# Patient Record
Sex: Male | Born: 1935 | Race: White | Hispanic: No | Smoking: Former smoker
Health system: Southern US, Community
[De-identification: ages and names within clinical notes are randomized; demographics above are authoritative.]

## PROBLEM LIST (undated history)

## (undated) DIAGNOSIS — Z85828 Personal history of other malignant neoplasm of skin: Secondary | ICD-10-CM

## (undated) DIAGNOSIS — L989 Disorder of the skin and subcutaneous tissue, unspecified: Secondary | ICD-10-CM

## (undated) DIAGNOSIS — D472 Monoclonal gammopathy: Secondary | ICD-10-CM

## (undated) DIAGNOSIS — E119 Type 2 diabetes mellitus without complications: Secondary | ICD-10-CM

## (undated) DIAGNOSIS — C9000 Multiple myeloma not having achieved remission: Secondary | ICD-10-CM

## (undated) DIAGNOSIS — N182 Chronic kidney disease, stage 2 (mild): Secondary | ICD-10-CM

## (undated) DIAGNOSIS — I10 Essential (primary) hypertension: Secondary | ICD-10-CM

## (undated) DIAGNOSIS — H409 Unspecified glaucoma: Secondary | ICD-10-CM

## (undated) DIAGNOSIS — C439 Malignant melanoma of skin, unspecified: Secondary | ICD-10-CM

## (undated) DIAGNOSIS — C001 Malignant neoplasm of external lower lip: Secondary | ICD-10-CM

## (undated) DIAGNOSIS — S68119A Complete traumatic metacarpophalangeal amputation of unspecified finger, initial encounter: Secondary | ICD-10-CM

## (undated) DIAGNOSIS — D61818 Other pancytopenia: Secondary | ICD-10-CM

## (undated) DIAGNOSIS — E7800 Pure hypercholesterolemia, unspecified: Secondary | ICD-10-CM

## (undated) HISTORY — PX: MELANOMA EXCISION: SHX5266

## (undated) HISTORY — DX: Malignant neoplasm of external lower lip: C00.1

## (undated) HISTORY — DX: Multiple myeloma not having achieved remission: C90.00

## (undated) HISTORY — DX: Personal history of other malignant neoplasm of skin: Z85.828

## (undated) HISTORY — PX: CORNEAL TRANSPLANT: SHX108

## (undated) HISTORY — DX: Pure hypercholesterolemia, unspecified: E78.00

## (undated) HISTORY — DX: Disorder of the skin and subcutaneous tissue, unspecified: L98.9

## (undated) HISTORY — PX: CATARACT EXTRACTION: SUR2

## (undated) HISTORY — PX: TONSILLECTOMY: SUR1361

## (undated) HISTORY — DX: Essential (primary) hypertension: I10

## (undated) HISTORY — DX: Complete traumatic metacarpophalangeal amputation of unspecified finger, initial encounter: S68.119A

## (undated) HISTORY — DX: Type 2 diabetes mellitus without complications: E11.9

## (undated) HISTORY — DX: Other pancytopenia: D61.818

## (undated) HISTORY — DX: Malignant melanoma of skin, unspecified: C43.9

## (undated) HISTORY — DX: Unspecified glaucoma: H40.9

## (undated) HISTORY — DX: Chronic kidney disease, stage 2 (mild): N18.2

## (undated) HISTORY — DX: Monoclonal gammopathy: D47.2

## (undated) HISTORY — PX: AMPUTATION FINGER / THUMB: SUR24

---

## 2001-02-03 ENCOUNTER — Other Ambulatory Visit: Admission: RE | Admit: 2001-02-03 | Discharge: 2001-02-03 | Payer: Self-pay | Admitting: Dermatology

## 2001-04-21 ENCOUNTER — Ambulatory Visit (HOSPITAL_COMMUNITY): Admission: RE | Admit: 2001-04-21 | Discharge: 2001-04-21 | Payer: Self-pay | Admitting: Cardiology

## 2001-10-27 ENCOUNTER — Emergency Department (HOSPITAL_COMMUNITY): Admission: EM | Admit: 2001-10-27 | Discharge: 2001-10-27 | Payer: Self-pay | Admitting: Internal Medicine

## 2001-10-30 ENCOUNTER — Emergency Department (HOSPITAL_COMMUNITY): Admission: EM | Admit: 2001-10-30 | Discharge: 2001-10-30 | Payer: Self-pay | Admitting: Emergency Medicine

## 2001-11-03 ENCOUNTER — Emergency Department (HOSPITAL_COMMUNITY): Admission: EM | Admit: 2001-11-03 | Discharge: 2001-11-03 | Payer: Self-pay | Admitting: Emergency Medicine

## 2001-11-10 ENCOUNTER — Emergency Department (HOSPITAL_COMMUNITY): Admission: EM | Admit: 2001-11-10 | Discharge: 2001-11-10 | Payer: Self-pay | Admitting: Emergency Medicine

## 2001-11-24 ENCOUNTER — Emergency Department (HOSPITAL_COMMUNITY): Admission: EM | Admit: 2001-11-24 | Discharge: 2001-11-24 | Payer: Self-pay | Admitting: Internal Medicine

## 2006-03-11 ENCOUNTER — Ambulatory Visit (HOSPITAL_COMMUNITY): Admission: RE | Admit: 2006-03-11 | Discharge: 2006-03-11 | Payer: Self-pay | Admitting: Internal Medicine

## 2006-03-11 ENCOUNTER — Ambulatory Visit: Payer: Self-pay | Admitting: Internal Medicine

## 2006-03-15 ENCOUNTER — Ambulatory Visit: Payer: Self-pay | Admitting: Cardiovascular Disease

## 2006-03-30 ENCOUNTER — Encounter (HOSPITAL_COMMUNITY): Admission: RE | Admit: 2006-03-30 | Discharge: 2006-04-29 | Payer: Self-pay | Admitting: Cardiovascular Disease

## 2006-03-30 ENCOUNTER — Ambulatory Visit: Payer: Self-pay | Admitting: Cardiovascular Disease

## 2006-06-01 ENCOUNTER — Ambulatory Visit: Payer: Self-pay | Admitting: Cardiovascular Disease

## 2006-06-04 ENCOUNTER — Ambulatory Visit (HOSPITAL_COMMUNITY): Admission: RE | Admit: 2006-06-04 | Discharge: 2006-06-04 | Payer: Self-pay | Admitting: Cardiovascular Disease

## 2006-06-04 ENCOUNTER — Ambulatory Visit: Payer: Self-pay | Admitting: Cardiology

## 2008-12-21 ENCOUNTER — Ambulatory Visit (HOSPITAL_BASED_OUTPATIENT_CLINIC_OR_DEPARTMENT_OTHER): Admission: RE | Admit: 2008-12-21 | Discharge: 2008-12-21 | Payer: Self-pay | Admitting: Orthopedic Surgery

## 2009-01-21 ENCOUNTER — Encounter: Payer: Self-pay | Admitting: General Surgery

## 2009-01-21 ENCOUNTER — Ambulatory Visit (HOSPITAL_BASED_OUTPATIENT_CLINIC_OR_DEPARTMENT_OTHER): Admission: RE | Admit: 2009-01-21 | Discharge: 2009-01-21 | Payer: Self-pay | Admitting: Orthopedic Surgery

## 2009-06-13 ENCOUNTER — Ambulatory Visit (HOSPITAL_COMMUNITY): Admission: RE | Admit: 2009-06-13 | Discharge: 2009-06-13 | Payer: Self-pay | Admitting: Pulmonary Disease

## 2010-02-23 ENCOUNTER — Encounter: Payer: Self-pay | Admitting: General Surgery

## 2010-05-05 LAB — POCT I-STAT, CHEM 8
BUN: 22 mg/dL (ref 6–23)
Chloride: 103 mEq/L (ref 96–112)
Creatinine, Ser: 1.2 mg/dL (ref 0.4–1.5)
Potassium: 3.4 mEq/L — ABNORMAL LOW (ref 3.5–5.1)
Sodium: 139 mEq/L (ref 135–145)

## 2010-05-07 LAB — POCT I-STAT, CHEM 8
BUN: 22 mg/dL (ref 6–23)
Calcium, Ion: 1.15 mmol/L (ref 1.12–1.32)
Creatinine, Ser: 1.2 mg/dL (ref 0.4–1.5)
HCT: 40 % (ref 39.0–52.0)
Potassium: 3.5 mEq/L (ref 3.5–5.1)
Sodium: 140 mEq/L (ref 135–145)
TCO2: 27 mmol/L (ref 0–100)

## 2010-05-07 LAB — GLUCOSE, CAPILLARY: Glucose-Capillary: 131 mg/dL — ABNORMAL HIGH (ref 70–99)

## 2010-06-20 NOTE — Procedures (Signed)
NAME:  CEFERINO, LANG NO.:  [id number]   MEDICAL RECORD NO.:  [id number]          PATIENT TYPE:  OUT   LOCATION:  RAD                           FACILITY:  [facility]   PHYSICIAN:  Gerrit Friends. Dietrich Pates, MD, FACCDATE OF BIRTH:  [birth year]   DATE OF PROCEDURE:  DATE OF DISCHARGE:                                ECHOCARDIOGRAM   CLINICAL DATA:  77 year old gentleman with a murmur and a history of  rheumatic fever.  M-mode aorta 3.2, left atrium 4.2, septum 1.4,  posterior wall 1.1, LV diastole 3.8, LV systole 3.1.  1. Technically adequate echocardiographic study.  2. Very mild left atrial enlargement; normal right atrium and right      ventricle.  3. Delicate mitral valve with normal function and minimal      regurgitation.  4. Trileaflet and normal aortic valve.  5. Normal tricuspid valve; physiologic regurgitation.  6. Normal pulmonic valve and proximal pulmonary artery.  7. Normal left ventricular size and function.  Wall thickness is      generally normal except for mild hypertrophy of the very proximal      portion of the septum.  8. Normal IVC.  9. Comparison with prior study of 04/21/2001:  No significant interval      change.   Per      Gerrit Friends. Dietrich Pates, MD, [facility]  Electronically Signed     RMR/MEDQ  D:  06/04/2006  T:  06/04/2006  Job:  [id number]

## 2010-06-20 NOTE — Op Note (Signed)
NAME:  Maurice Cooper, Maurice Cooper NO.:  [id number]   MEDICAL RECORD NO.:  [id number]          PATIENT TYPE:  AMB   LOCATION:  DAY                           FACILITY:  [facility]   PHYSICIAN:  R. Roetta Sessions, M.D. DATE OF BIRTH:  27-Mar-1931   DATE OF PROCEDURE:  03/11/2006  DATE OF DISCHARGE:                               OPERATIVE REPORT   PROCEDURE:  Screening colonoscopy.   INDICATIONS FOR PROCEDURE:  The patient is a 77 year old Caucasian male  sent over courtesy Dr. Juanetta Gosling for colorectal cancer screening.  He has  never had his lower GI tract imaged.  He has no lower GI tract symptoms.  There is no family history of colorectal neoplasia.  Colonoscopy is now  being done as screening maneuver.  This approach has been discussed with  the patient at length.  Potential risks, benefits and alternatives have  been reviewed, questions answered, he is agreeable.  Please see  documentation in the medical record.   PROCEDURE NOTE:  O2 saturation, blood pressure, pulse and respirations  monitored throughout the entire procedure.   CONSCIOUS SEDATION:  Versed 3 mg IV, Demerol 75 mg IV in divided doses.   INSTRUMENT:  Pentax video chip system.   FINDINGS:  Digital rectal exam revealed no abnormalities.   ENDOSCOPIC FINDINGS:  The prep was adequate.  Examination colonic mucosa  was undertaken from the rectosigmoid junction through the left,  transverse, right colon to the area of appendiceal orifice, ileocecal  valve and cecum.  These structures were well seen for the record.  From  this level, the scope slowly and cautiously withdrawn, all previously  mentioned  mucosal surfaces were again seen.  The patient had extensive  left-sided diverticula, remainder of colonic mucosa appeared normal.  The scope was pulled down to the rectum.  Thorough examination of the  rectal mucosa including retroflexion view of the anal verge was  undertaken.  The rectal mucosa appeared normal.  The  patient tolerated  the procedure well although on the monitor he was noted to have multiple  PVCs, no couplets and he did not have any ventricular tachycardia.  He  has not had any chest pain, dyspnea on exertion prior to this procedure  and has not had any cardiopulmonary symptoms throughout or immediately  following this procedure.   IMPRESSION:  1. Normal rectum, extensive left-sided diverticula, remainder of      colonic mucosa appeared normal.  2. PVCs asymptomatic.   RECOMMENDATIONS:  1. Diverticulosis literature provided [name].  2. Will obtain 12-lead EKG in PACU and will make sure Dr. Juanetta Gosling get      a copy and have recommended [name] follow up with Dr. Juanetta Gosling to      further evaluate PVCs as he sees fit.  3. Would consider another screening colonoscopy in 10 years.      Jonathon Bellows, M.D.  Electronically Signed     RMR/MEDQ  D:  03/11/2006  T:  03/11/2006  Job:  [id number]   cc:   Ramon Dredge L. Juanetta Gosling, M.D.  Fax: [phone]

## 2010-06-20 NOTE — Assessment & Plan Note (Signed)
[facility]                        CARDIOLOGY OFFICE NOTE   Maurice Cooper, Maurice Cooper                          MRN:          [id number]  DATE:06/01/2006                            DOB:          05-22-1931    Maurice Cooper was seen today in followup. He has a history of PACs and  palpitations. He has not had any palpitations recently in regard to his  PACs. He was actually having some in the room today, but they were  asymptomatic. He has had a normal heart catheterization back in 1993.  His last stress test was done March 8 of this year and was normal with  an EF of 53%. He has no structural heart disease. He does have a history  of rheumatic disease, and we will try to locate his last echocardiogram,  but he may need one. In regards to his palpitations, they have not been  a problem over the last few months. He is active. 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 He has had no significant chest pain, diaphoresis,  PND, orthopnea, or presyncope.   His review of systems is otherwise negative except as noted in the HPI.   MEDICATIONS:  1. Metoprolol 200 a day.  2. Norvasc 10 a day.  3. Diovan 160/25.  4. Glipizide 5 a day.  5. Simvastatin.   PHYSICAL EXAMINATION:  Remarkable for a blood pressure of 140/80, pulse  is 68 with occasional PACs clinically.  HEENT:  Normal. Carotids are normal without bruit.  LUNGS:  Clear. There is a S1/S2 without significant rheumatic murmur.  ABDOMEN:  Benign.  LOWER EXTREMITIES:  Intact pulse. No edema.  SKIN:  Warm and dry.  NEUROLOGICAL:  Nonfocal.   IMPRESSION:  Multiple coronary risk factors including hypertension,  hypercholesterolemia, and diabetes. Negative stress test in March. Good  LV function. History of rheumatic disease without significant murmur.  Possibly order a followup echocardiogram if he has not had one in the  last 10 years.   The patient's blood pressure is well controlled. We will  continue his  metoprolol, Norvasc and Diovan.   He will follow up with his primary care M.D., Dr. Juanetta Gosling, in regards to  his hemoglobin A1c. I will see him back in a year.   He is interesting that he has had symptomatic palpitations in the past  but currently is apparently having symptomatic PACs. In general, there  is no evidence of coronary artery disease or significant structural  heart disease, and I think risk factor modification is all that is  needed.     Noralyn Pick. Eden Emms, MD, [facility]  Electronically Signed    PCN/MedQ  DD: 06/01/2006  DT: 06/01/2006  Job #: [id number]

## 2010-06-20 NOTE — Assessment & Plan Note (Signed)
[facility]                       Maurice Cooper CARDIOLOGY OFFICE NOTE   Maurice Cooper, Maurice Cooper                          MRN:          [id number]  DATE:03/15/2006                            DOB:          1931-11-03    Maurice Cooper is referred by Dr. Juanetta Gosling for atrial fibrillation.  He is 77  years old.   His coronary risk factors include hypercholesterolemia, diabetes, and  hypertension.  The patient does not notice any palpitations.  He has not  had any chest pain or exertional dyspnea.   I reviewed the patient's EKGs from Dr. Juanetta Gosling' office, in particular  one dated March 11, 2006, was read by the computer as atrial  fibrillation.  However, he clearly has P waves and is in sinus rhythm  with frequent PACs.   I reviewed the EKG from the patient in our office today as well and he  similarly has sinus rhythm with PACs.   I have not seen any electrocardiogram that actually shows atrial  fibrillation.   The patient's past medical history is otherwise fairly benign for his  age.  He has long-standing high blood pressure which is well-treated.  He has been on oral hypoglycemics for years and does not know what his  hemoglobin A1c is.  He has hypocholesterolemia on Simvastatin.  He does  not recall a recent echo or stress test.   ALLERGIES:  The patient denies any allergies.   MEDICATIONS:  His medications include:  Lopressor 200 mg a day.  Norvasc 10 mg a day.  Diovan 160/25.  Glipizide 5 mg a day.  Simvastatin 20 mg a day.   He is a widower for the last 2 years.  He has a son who lives in  [place] who he sees on a daily basis.  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  He is otherwise fairly active.  He does not smoke or drink.   FAMILY HISTORY:  Noncontributory.   PHYSICAL EXAMINATION:  Remarkable for a blood pressure 130/80.  Pulse 68  and regular.  HEENT:  Normal.  CAROTIDS:  Without bruits.  LUNGS:   Clear.  HEART:  Normal S1 and S2 heart sounds.  ABDOMEN:  Benign.  LOWER EXTREMITIES:  Intact pulse, no edema.   EKG shows sinus rhythm with PACs and PVCs.  There is no atrial  fibrillation.   IMPRESSION:  Maurice Cooper is asymptomatic.  He is having frequent PACs and  an occasional PVC.  There is no atrial fibrillation.  He will continue  on aspirin a day.  He does not need Coumadin.  Given his multiple risk  factors, I would like to do a stress Myoview on the patient at the time  I will rule out any significant exercise-induced arrhythmias, assess his  blood pressure, control it with exercise, and rule out coronary artery  disease.   His blood pressure seems reasonably controlled on his current 3  medications.   He will follow with Dr. Juanetta Gosling in regards to his hemoglobin A1c and  sugar control.  He should  have a follow-up liver and lipid panel in six  months.   Further recommendations will be based on the results of the patient's  stress Myoview.   However, in regards to his rhythm, he is clearly in sinus and does not  need Coumadin therapy.     Noralyn Pick. Eden Emms, MD, [facility]  Electronically Signed    PCN/MedQ  DD: 03/15/2006  DT: 03/15/2006  Job #: [id number]   cc:   Ramon Dredge L. Juanetta Gosling, M.D.

## 2013-07-24 LAB — FECAL OCCULT BLOOD, GUAIAC: Fecal Occult Blood: NEGATIVE

## 2013-07-25 ENCOUNTER — Telehealth (HOSPITAL_COMMUNITY): Payer: Self-pay | Admitting: Hematology and Oncology

## 2013-08-09 ENCOUNTER — Encounter (HOSPITAL_COMMUNITY): Payer: Medicare Other | Attending: Hematology and Oncology

## 2013-08-09 ENCOUNTER — Encounter (HOSPITAL_COMMUNITY): Payer: Self-pay

## 2013-08-09 ENCOUNTER — Encounter (HOSPITAL_BASED_OUTPATIENT_CLINIC_OR_DEPARTMENT_OTHER): Payer: Medicare Other

## 2013-08-09 VITALS — BP 178/72 | HR 60 | Temp 97.5°F | Resp 18 | Ht 68.5 in | Wt 174.0 lb

## 2013-08-09 DIAGNOSIS — D61818 Other pancytopenia: Secondary | ICD-10-CM | POA: Insufficient documentation

## 2013-08-09 DIAGNOSIS — H409 Unspecified glaucoma: Secondary | ICD-10-CM | POA: Insufficient documentation

## 2013-08-09 DIAGNOSIS — N400 Enlarged prostate without lower urinary tract symptoms: Secondary | ICD-10-CM | POA: Insufficient documentation

## 2013-08-09 DIAGNOSIS — Z87891 Personal history of nicotine dependence: Secondary | ICD-10-CM | POA: Diagnosis not present

## 2013-08-09 DIAGNOSIS — H4089 Other specified glaucoma: Secondary | ICD-10-CM

## 2013-08-09 DIAGNOSIS — E119 Type 2 diabetes mellitus without complications: Secondary | ICD-10-CM | POA: Diagnosis not present

## 2013-08-09 DIAGNOSIS — D696 Thrombocytopenia, unspecified: Secondary | ICD-10-CM

## 2013-08-09 DIAGNOSIS — D72819 Decreased white blood cell count, unspecified: Secondary | ICD-10-CM

## 2013-08-09 DIAGNOSIS — D649 Anemia, unspecified: Secondary | ICD-10-CM

## 2013-08-09 DIAGNOSIS — I129 Hypertensive chronic kidney disease with stage 1 through stage 4 chronic kidney disease, or unspecified chronic kidney disease: Secondary | ICD-10-CM | POA: Insufficient documentation

## 2013-08-09 DIAGNOSIS — I10 Essential (primary) hypertension: Secondary | ICD-10-CM

## 2013-08-09 DIAGNOSIS — N182 Chronic kidney disease, stage 2 (mild): Secondary | ICD-10-CM | POA: Diagnosis not present

## 2013-08-09 DIAGNOSIS — Z947 Corneal transplant status: Secondary | ICD-10-CM | POA: Diagnosis not present

## 2013-08-09 DIAGNOSIS — S68019A Complete traumatic metacarpophalangeal amputation of unspecified thumb, initial encounter: Secondary | ICD-10-CM | POA: Diagnosis not present

## 2013-08-09 DIAGNOSIS — E7800 Pure hypercholesterolemia, unspecified: Secondary | ICD-10-CM | POA: Diagnosis not present

## 2013-08-09 DIAGNOSIS — N189 Chronic kidney disease, unspecified: Secondary | ICD-10-CM | POA: Insufficient documentation

## 2013-08-09 LAB — CBC WITH DIFFERENTIAL/PLATELET
BASOS ABS: 0 10*3/uL (ref 0.0–0.1)
BASOS PCT: 0 % (ref 0–1)
EOS PCT: 3 % (ref 0–5)
Eosinophils Absolute: 0.1 10*3/uL (ref 0.0–0.7)
HCT: 37.5 % — ABNORMAL LOW (ref 39.0–52.0)
HEMOGLOBIN: 13.3 g/dL (ref 13.0–17.0)
LYMPHS PCT: 59 % — AB (ref 12–46)
Lymphs Abs: 1.7 10*3/uL (ref 0.7–4.0)
MCH: 32 pg (ref 26.0–34.0)
MCHC: 35.5 g/dL (ref 30.0–36.0)
MCV: 90.1 fL (ref 78.0–100.0)
MONO ABS: 0.2 10*3/uL (ref 0.1–1.0)
MONOS PCT: 8 % (ref 3–12)
NEUTROS PCT: 31 % — AB (ref 43–77)
Neutro Abs: 0.9 10*3/uL — ABNORMAL LOW (ref 1.7–7.7)
PLATELETS: 134 10*3/uL — AB (ref 150–400)
RBC: 4.16 MIL/uL — AB (ref 4.22–5.81)
RDW: 12.6 % (ref 11.5–15.5)
WBC: 2.8 10*3/uL — AB (ref 4.0–10.5)

## 2013-08-09 LAB — COMPREHENSIVE METABOLIC PANEL
ALK PHOS: 76 U/L (ref 39–117)
ALT: 27 U/L (ref 0–53)
ANION GAP: 12 (ref 5–15)
AST: 29 U/L (ref 0–37)
Albumin: 4.3 g/dL (ref 3.5–5.2)
BUN: 17 mg/dL (ref 6–23)
CALCIUM: 9.3 mg/dL (ref 8.4–10.5)
CO2: 29 mEq/L (ref 19–32)
CREATININE: 1.21 mg/dL (ref 0.50–1.35)
Chloride: 98 mEq/L (ref 96–112)
GFR calc non Af Amer: 55 mL/min — ABNORMAL LOW (ref >90)
GFR, EST AFRICAN AMERICAN: 63 mL/min — AB (ref >90)
Glucose, Bld: 148 mg/dL — ABNORMAL HIGH (ref 70–99)
Potassium: 3.8 mEq/L (ref 3.7–5.3)
Sodium: 139 mEq/L (ref 137–147)
Total Bilirubin: 0.5 mg/dL (ref 0.3–1.2)
Total Protein: 9.1 g/dL — ABNORMAL HIGH (ref 6.0–8.3)

## 2013-08-09 LAB — RETICULOCYTES
RBC.: 4.16 MIL/uL — ABNORMAL LOW (ref 4.22–5.81)
RETIC COUNT ABSOLUTE: 49.9 10*3/uL (ref 19.0–186.0)
RETIC CT PCT: 1.2 % (ref 0.4–3.1)

## 2013-08-09 LAB — LACTATE DEHYDROGENASE: LDH: 207 U/L (ref 94–250)

## 2013-08-09 NOTE — Progress Notes (Unsigned)
Maurice Cooper presented for labwork. Labs per MD order drawn via Peripheral Line 23 gauge needle inserted in right AC  Good blood return present. Procedure without incident.  Needle removed intact. Patient tolerated procedure well.

## 2013-08-09 NOTE — Progress Notes (Signed)
Maurice Cooper, M.D.  NEW PATIENT EVALUATION   Name: Maurice Cooper Date: 08/09/2013 MRN: [id number] DOB: 1931-12-17  PCP: [name], MD   REFERRING PHYSICIAN: No ref. provider found  REASON FOR REFERRAL: Pancytopenia     HISTORY OF PRESENT ILLNESS:Maurice Cooper is a 80 y.o. male who is referred by his family physician for decreased white blood cell and platelet count. He has never been told that his white blood cell count and platelet count are below normal. Appetite and good with no nausea, vomiting, diarrhea, constipation, melena, hematochezia, hematuria, easy satiety, cough, wheezing, PND, orthopnea, palpitations, lower extremity swelling or redness, joint pain, skin rash, headache, or seizures. Fasting blood sugar this morning at home was 110 mg percent. He has had no episodes of hypoglycemia in the past.   PAST MEDICAL HISTORY:  has a past medical history of Diabetes mellitus; Hypertension; Hypercholesteremia; and Melanoma.     PAST SURGICAL HISTORY: Past Surgical History  Procedure Laterality Date  . Cataract extraction Bilateral   . Corneal transplant Right   . Tonsillectomy    . Melanoma excision Left     left flank area 1980s  . Amputation finger / thumb Left     forefinger     CURRENT MEDICATIONS: has a current medication list which includes the following prescription(s): amlodipine besylate, aspirin ec, cinnamon, doxazosin mesylate, glipizide, metoprolol, multiple vitamins-minerals, potassium chloride, pravastatin, valsartan-hydrochlorothiazide, and vitamin c.   ALLERGIES: Review of patient's allergies indicates not on file.   SOCIAL HISTORY:  reports that he has quit smoking. His smoking use included Cigarettes. He smoked 0.00 packs per day. He has quit using smokeless tobacco. His smokeless tobacco use included Chew. He reports that he does not drink alcohol or use illicit drugs.   FAMILY HISTORY:  family history includes Cancer in his mother; Stroke in his father.    REVIEW OF SYSTEMS:  Other than that discussed above is noncontributory.    PHYSICAL EXAM:  height is 5' 8.5" (1.74 m) and weight is 174 lb (78.926 kg). His oral temperature is 97.5 F (36.4 C). His blood pressure is 178/72 and his pulse is 60. His respiration is 18 and oxygen saturation is 96%.    GENERAL:alert, no distress and comfortable, looking much younger than his stated age. SKIN: skin color, texture, turgor are normal, no rashes or significant lesions EYES: normal, Conjunctiva are pink and non-injected, sclera clear. Right attempted corneal transplant is  opaque. OROPHARYNX:no exudate, no erythema and lips, buccal mucosa, and tongue normal  NECK: supple, thyroid normal size, non-tender, without nodularity CHEST: Normal AP diameter with no breast masses. LYMPH:  no palpable lymphadenopathy in the cervical, axillary or inguinal LUNGS: clear to auscultation and percussion with normal breathing effort HEART: regular rate & rhythm and no murmurs ABDOMEN:abdomen soft, non-tender and normal bowel sounds. No hepatosplenomegaly, ascites, or CVA tenderness. MUSCULOSKELETALl:no cyanosis of digits, no clubbing or edema  NEURO: alert & oriented x 3 with fluent speech, no focal motor/sensory deficits    LABORATORY DATA:    Results for EDGARD, DEBORD (MRN [id number]) as of 08/09/2013 16:21  Ref. Range 08/09/2013 15:42  WBC Latest Range: 4.0-10.5 K/uL 2.8 (L)  RBC Latest Range: 4.22-5.81 MIL/uL 4.16 (L)  Hemoglobin Latest Range: 13.0-17.0 g/dL 13.3  HCT Latest Range: 39.0-52.0 % 37.5 (L)  MCV Latest Range: 78.0-100.0 fL 90.1  MCH Latest Range: 26.0-34.0 pg 32.0  MCHC Latest Range: 30.0-36.0  g/dL 35.5  RDW Latest Range: 11.5-15.5 % 12.6  Platelets Latest Range: 150-400 K/uL 134 (L)  Neutrophils Relative % Latest Range: 43-77 % 31 (L)  Lymphocytes Relative Latest Range: 12-46 % 59 (H)  Monocytes Relative Latest Range: 3-12  % 8  Eosinophils Relative Latest Range: 0-5 % 3  Basophils Relative Latest Range: 0-1 % 0  NEUT# Latest Range: 1.7-7.7 K/uL 0.9 (L)  Lymphocytes Absolute Latest Range: 0.7-4.0 K/uL 1.7  Monocytes Absolute Latest Range: 0.1-1.0 K/uL 0.2  Eosinophils Absolute Latest Range: 0.0-0.7 K/uL 0.1  Basophils Absolute Latest Range: 0.0-0.1 K/uL 0.0  RBC. Latest Range: 4.22-5.81 MIL/uL 4.16 (L)  Retic Ct Pct Latest Range: 0.4-3.1 % 1.2  Retic Count, Manual Latest Range: 19.0-186.0 K/uL 49.9    07/19/2013: WBC 3.1, hemoglobin 13.4, platelets 138,000 with ANC of 1.2.  49% lymphocytes, MCV 89                   BUN 22, creatinine 1.21, bilirubin 0.7, total protein 8.8, calcium 9.3                   TSH 2.893    Urinalysis No results found for this basename: colorurine,  appearanceur,  labspec,  phurine,  glucoseu,  hgbur,  bilirubinur,  ketonesur,  proteinur,  urobilinogen,  nitrite,  leukocytesur      @RADIOGRAPHY : No results found.  PATHOLOGY: Peripheral smear failed to reveal evidence of platelet clumping or premature forms. No evidence of basophilic stippling or hypersegmented neutrophils.   IMPRESSION:  #1. Mild leukopenia and thrombocytopenia in the setting of normocytic normochromic red cells, suggestive of autoimmune phenomenon or nutritional deficiency. #2. Diabetes mellitus, type II, no complications. #3. Stage II chronic kidney disease #4. Essential hypertension. #5. Glaucoma, on treatment. #6. Failed right corneal transplant.   PLAN:  #1. Additional lab tests were done today to help elucidate the cause of borderline leukopenia and thrombocytopenia. #2. Patient was told to continue his current medications. #3. Followup on 08/30/2013 with CBC that day.  I appreciate the option of sharing in his care.  Doroteo Bradford, MD 08/09/2013 4:21 PM   DISCLAIMER:  This note was dictated with voice recognition softwre.  Similar sounding words can inadvertently be transcribed  inaccurately and may not be corrected upon review.

## 2013-08-09 NOTE — Patient Instructions (Signed)
[facility] Discharge Instructions  RECOMMENDATIONS MADE BY THE CONSULTANT AND ANY TEST RESULTS WILL BE SENT TO YOUR REFERRING PHYSICIAN.  EXAM FINDINGS BY THE PHYSICIAN TODAY AND SIGNS OR SYMPTOMS TO REPORT TO CLINIC OR PRIMARY PHYSICIAN: Exam and findings as discussed by Dr. Barnet Glasgow.  We need to do some additional blood work and will see you back to discuss results.  If there's any result that requires immediate care we will contact you.  MEDICATIONS PRESCRIBED:  none  INSTRUCTIONS/FOLLOW-UP: Follow-up on 08/30/13.  Thank you for choosing [facility] to provide your oncology and hematology care.  To afford each patient quality time with our providers, please arrive at least 15 minutes before your scheduled appointment time.  With your help, our goal is to use those 15 minutes to complete the necessary work-up to ensure our physicians have the information they need to help with your evaluation and healthcare recommendations.    Effective January 1st, 2014, we ask that you re-schedule your appointment with our physicians should you arrive 10 or more minutes late for your appointment.  We strive to give you quality time with our providers, and arriving late affects you and other patients whose appointments are after yours.    Again, thank you for choosing [facility].  Our hope is that these requests will decrease the amount of time that you wait before being seen by our physicians.       _____________________________________________________________  Should you have questions after your visit to [facility], please contact our office at [phone] between the hours of 8:30 a.m. and 4:30 p.m.  Voicemails left after 4:30 p.m. will not be returned until the following business day.  For prescription refill requests, have your pharmacy contact our office with your prescription refill request.     _______________________________________________________________  We hope that we have given you very good care.  You may receive a patient satisfaction survey in the mail, please complete it and return it as soon as possible.  We value your feedback!  _______________________________________________________________  Have you asked about our STAR program?  STAR stands for Survivorship Training and Rehabilitation, and this is a nationally recognized cancer care program that focuses on survivorship and rehabilitation.  Cancer and cancer treatments may cause problems, such as, pain, making you feel tired and keeping you from doing the things that you need or want to do. Cancer rehabilitation can help. Our goal is to reduce these troubling effects and help you have the best quality of life possible.  You may receive a survey from a nurse that asks questions about your current state of health.  Based on the survey results, all eligible patients will be referred to the [facility] program for an evaluation so we can better serve you!  A frequently asked questions sheet is available upon request.

## 2013-08-10 LAB — ANTI-NUCLEAR AB-TITER (ANA TITER): ANA Titer 1: 1:320 {titer} — ABNORMAL HIGH

## 2013-08-10 LAB — FOLATE: Folate: >20 ng/mL

## 2013-08-10 LAB — VITAMIN B12: VITAMIN B 12: 599 pg/mL (ref 211–911)

## 2013-08-10 LAB — ANA: Anti Nuclear Antibody(ANA): POSITIVE — AB

## 2013-08-11 LAB — CARDIOLIPIN ANTIBODY: Phospholipids: 192 mg/dL (ref 151–264)

## 2013-08-30 ENCOUNTER — Encounter (HOSPITAL_BASED_OUTPATIENT_CLINIC_OR_DEPARTMENT_OTHER): Payer: Medicare Other

## 2013-08-30 ENCOUNTER — Encounter (HOSPITAL_COMMUNITY): Payer: Self-pay

## 2013-08-30 VITALS — BP 178/67 | HR 58 | Temp 97.9°F | Resp 18 | Wt 174.5 lb

## 2013-08-30 DIAGNOSIS — E119 Type 2 diabetes mellitus without complications: Secondary | ICD-10-CM

## 2013-08-30 DIAGNOSIS — D61818 Other pancytopenia: Secondary | ICD-10-CM | POA: Diagnosis not present

## 2013-08-30 DIAGNOSIS — J3089 Other allergic rhinitis: Secondary | ICD-10-CM

## 2013-08-30 DIAGNOSIS — I10 Essential (primary) hypertension: Secondary | ICD-10-CM

## 2013-08-30 DIAGNOSIS — N182 Chronic kidney disease, stage 2 (mild): Secondary | ICD-10-CM

## 2013-08-30 DIAGNOSIS — R768 Other specified abnormal immunological findings in serum: Secondary | ICD-10-CM

## 2013-08-30 DIAGNOSIS — R894 Abnormal immunological findings in specimens from other organs, systems and tissues: Secondary | ICD-10-CM

## 2013-08-30 LAB — CBC WITH DIFFERENTIAL/PLATELET
Basophils Absolute: 0 10*3/uL (ref 0.0–0.1)
Basophils Relative: 1 % (ref 0–1)
EOS ABS: 0.1 10*3/uL (ref 0.0–0.7)
Eosinophils Relative: 4 % (ref 0–5)
HEMATOCRIT: 35.5 % — AB (ref 39.0–52.0)
Hemoglobin: 12.5 g/dL — ABNORMAL LOW (ref 13.0–17.0)
LYMPHS ABS: 1.5 10*3/uL (ref 0.7–4.0)
Lymphocytes Relative: 50 % — ABNORMAL HIGH (ref 12–46)
MCH: 32.1 pg (ref 26.0–34.0)
MCHC: 35.2 g/dL (ref 30.0–36.0)
MCV: 91 fL (ref 78.0–100.0)
Monocytes Absolute: 0.3 10*3/uL (ref 0.1–1.0)
Monocytes Relative: 9 % (ref 3–12)
NEUTROS ABS: 1 10*3/uL — AB (ref 1.7–7.7)
NEUTROS PCT: 36 % — AB (ref 43–77)
Platelets: 219 10*3/uL (ref 150–400)
RBC: 3.9 MIL/uL — ABNORMAL LOW (ref 4.22–5.81)
RDW: 12.9 % (ref 11.5–15.5)
WBC: 2.9 10*3/uL — ABNORMAL LOW (ref 4.0–10.5)

## 2013-08-30 MED ORDER — FLUTICASONE PROPIONATE 50 MCG/ACT NA SUSP: NASAL | Status: DC

## 2013-08-30 NOTE — Progress Notes (Signed)
Maurice Cooper presented for labwork. Labs per MD order drawn via Peripheral Line 25 gauge needle inserted in lt ac.  Good blood return present. Procedure without incident.  Needle removed intact. Patient tolerated procedure well.

## 2013-08-30 NOTE — Progress Notes (Signed)
[facility]  OFFICE PROGRESS NOTE  [name], MD [place] [address]  DIAGNOSIS: Pancytopenia - Plan: CBC with Differential, CBC with Differential  ANA positive, speckled pattern, 1:320  Type 2 diabetes mellitus without complication  Chronic kidney disease, stage 2 (mild)  Essential hypertension  Allergic rhinitis due to other allergen  Chief Complaint  Patient presents with  . Pancytopenia  . Follow-up    CURRENT THERAPY: Followup after additional workup for pancytopenia.  INTERVAL HISTORY: Maurice Cooper 80 y.o. male returns for followup after additional workup for pancytopenia.  Primary problem is dry cough with no expectoration or hemoptysis. He denies any epistaxis. He also denies any earache, sore throat, fever, lower extremity swelling or redness, abdominal pain, easy satiety, lymphadenopathy, joint pain, skin rash, headache, or seizures.  MEDICAL HISTORY: Past Medical History  Diagnosis Date  . Diabetes mellitus   . Hypertension   . Hypercholesteremia   . Melanoma     lower back    INTERIM HISTORY: has Benign prostatic hypertrophy; Diabetes mellitus; Chronic kidney disease; Hypertension; and Glaucoma associated with systemic syndrome on his problem list.    ALLERGIES:  has No Known Allergies.  MEDICATIONS: has a current medication list which includes the following prescription(s): amlodipine besylate, aspirin ec, cinnamon, doxazosin mesylate, fluticasone, glipizide, metoprolol, multiple vitamins-minerals, potassium chloride, pravastatin, valsartan-hydrochlorothiazide, and vitamin c.  SURGICAL HISTORY:  Past Surgical History  Procedure Laterality Date  . Cataract extraction Bilateral   . Corneal transplant Right   . Tonsillectomy    . Melanoma excision Left     left flank area 1980s  . Amputation finger / thumb Left     forefinger    FAMILY HISTORY: family history  includes Cancer in his mother; Stroke in his father.  SOCIAL HISTORY:  reports that he has quit smoking. His smoking use included Cigarettes. He smoked 0.00 packs per day. He has quit using smokeless tobacco. His smokeless tobacco use included Chew. He reports that he does not drink alcohol or use illicit drugs.  REVIEW OF SYSTEMS:  Other than that discussed above is noncontributory.  PHYSICAL EXAMINATION: ECOG PERFORMANCE STATUS: 1 - Symptomatic but completely ambulatory  Blood pressure 178/67, pulse 58, temperature 97.9 F (36.6 C), temperature source Oral, resp. rate 18, weight 174 lb 8 oz (79.153 kg).  GENERAL:alert, no distress and comfortable SKIN: skin color, texture, turgor are normal, no rashes or significant lesions EYES: PERLA; Conjunctiva are pink and non-injected, sclera clear. Right corneal opacity. SINUSES: No redness or tenderness over maxillary or ethmoid sinuses OROPHARYNX:no exudate, posterior pharyngeal injection with whitish streaking but no erythema. NECK: supple, thyroid normal size, non-tender, without nodularity. No masses CHEST: Normal AP diameter with no breast masses. LYMPH:  no palpable lymphadenopathy in the cervical, axillary or inguinal LUNGS: clear to auscultation and percussion with normal breathing effort HEART: regular rate & rhythm and no murmurs. ABDOMEN:abdomen soft, non-tender and normal bowel sounds MUSCULOSKELETAL:no cyanosis of digits and no clubbing. Range of motion normal. Left thumb distal phalanx amputation NEURO: alert & oriented x 3 with fluent speech, no focal motor/sensory deficits   LABORATORY DATA: Office Visit on 08/30/2013  Component Date Value Ref Range Status  . WBC 08/30/2013 2.9* 4.0 - 10.5 K/uL Final  . RBC 08/30/2013 3.90* 4.22 - 5.81 MIL/uL Final  . Hemoglobin 08/30/2013 12.5* 13.0 - 17.0 g/dL Final  . HCT 08/30/2013 35.5* 39.0 - 52.0 % Final  .  MCV 08/30/2013 91.0  78.0 - 100.0 fL Final  . MCH 08/30/2013 32.1  26.0 -  34.0 pg Final  . MCHC 08/30/2013 35.2  30.0 - 36.0 g/dL Final  . RDW 08/30/2013 12.9  11.5 - 15.5 % Final  . Platelets 08/30/2013 219  150 - 400 K/uL Final  . Neutrophils Relative % 08/30/2013 36* 43 - 77 % Final  . Neutro Abs 08/30/2013 1.0* 1.7 - 7.7 K/uL Final  . Lymphocytes Relative 08/30/2013 50* 12 - 46 % Final  . Lymphs Abs 08/30/2013 1.5  0.7 - 4.0 K/uL Final  . Monocytes Relative 08/30/2013 9  3 - 12 % Final  . Monocytes Absolute 08/30/2013 0.3  0.1 - 1.0 K/uL Final  . Eosinophils Relative 08/30/2013 4  0 - 5 % Final  . Eosinophils Absolute 08/30/2013 0.1  0.0 - 0.7 K/uL Final  . Basophils Relative 08/30/2013 1  0 - 1 % Final  . Basophils Absolute 08/30/2013 0.0  0.0 - 0.1 K/uL Final  Lab on 08/09/2013  Component Date Value Ref Range Status  . WBC 08/09/2013 2.8* 4.0 - 10.5 K/uL Final  . RBC 08/09/2013 4.16* 4.22 - 5.81 MIL/uL Final  . Hemoglobin 08/09/2013 13.3  13.0 - 17.0 g/dL Final  . HCT 08/09/2013 37.5* 39.0 - 52.0 % Final  . MCV 08/09/2013 90.1  78.0 - 100.0 fL Final  . MCH 08/09/2013 32.0  26.0 - 34.0 pg Final  . MCHC 08/09/2013 35.5  30.0 - 36.0 g/dL Final  . RDW 08/09/2013 12.6  11.5 - 15.5 % Final  . Platelets 08/09/2013 134* 150 - 400 K/uL Final  . Neutrophils Relative % 08/09/2013 31* 43 - 77 % Final  . Neutro Abs 08/09/2013 0.9* 1.7 - 7.7 K/uL Final  . Lymphocytes Relative 08/09/2013 59* 12 - 46 % Final  . Lymphs Abs 08/09/2013 1.7  0.7 - 4.0 K/uL Final  . Monocytes Relative 08/09/2013 8  3 - 12 % Final  . Monocytes Absolute 08/09/2013 0.2  0.1 - 1.0 K/uL Final  . Eosinophils Relative 08/09/2013 3  0 - 5 % Final  . Eosinophils Absolute 08/09/2013 0.1  0.0 - 0.7 K/uL Final  . Basophils Relative 08/09/2013 0  0 - 1 % Final  . Basophils Absolute 08/09/2013 0.0  0.0 - 0.1 K/uL Final  . Retic Ct Pct 08/09/2013 1.2  0.4 - 3.1 % Final  . RBC. 08/09/2013 4.16* 4.22 - 5.81 MIL/uL Final  . Retic Count, Manual 08/09/2013 49.9  19.0 - 186.0 K/uL Final  . Sodium  08/09/2013 139  137 - 147 mEq/L Final  . Potassium 08/09/2013 3.8  3.7 - 5.3 mEq/L Final  . Chloride 08/09/2013 98  96 - 112 mEq/L Final  . CO2 08/09/2013 29  19 - 32 mEq/L Final  . Glucose, Bld 08/09/2013 148* 70 - 99 mg/dL Final  . BUN 08/09/2013 17  6 - 23 mg/dL Final  . Creatinine, Ser 08/09/2013 1.21  0.50 - 1.35 mg/dL Final  . Calcium 08/09/2013 9.3  8.4 - 10.5 mg/dL Final  . Total Protein 08/09/2013 9.1* 6.0 - 8.3 g/dL Final  . Albumin 08/09/2013 4.3  3.5 - 5.2 g/dL Final  . AST 08/09/2013 29  0 - 37 U/L Final  . ALT 08/09/2013 27  0 - 53 U/L Final  . Alkaline Phosphatase 08/09/2013 76  39 - 117 U/L Final  . Total Bilirubin 08/09/2013 0.5  0.3 - 1.2 mg/dL Final  . GFR calc non Af Amer 08/09/2013 55* >90  mL/min Final  . GFR calc Af Amer 08/09/2013 63* >90 mL/min Final   Comment: (NOTE)                          The eGFR has been calculated using the CKD EPI equation.                          This calculation has not been validated in all clinical situations.                          eGFR's persistently <90 mL/min signify possible Chronic Kidney                          Disease.  . Anion gap 08/09/2013 12  5 - 15 Final  . LDH 08/09/2013 207  94 - 250 U/L Final   SLIGHT HEMOLYSIS  . Vitamin B-12 08/09/2013 599  211 - 911 pg/mL Final   Performed at [facility]  . Folate 08/09/2013 >20.0   Final   Comment: (NOTE)                          Reference Ranges                                 Deficient:       0.4 - 3.3 ng/mL                                 Indeterminate:   3.4 - 5.4 ng/mL                                 Normal:              > 5.4 ng/mL                          Performed at [facility]  . Phospholipids 08/09/2013 192  151 - 264 mg/dL Final   Comment: (NOTE)                          This test was performed using a kit that has not                          been cleared or approved by the FDA.  The                          analytical performance  characteristics of this                          test have been determined by [facility], [place], [place].  This test should                          not be used for  diagnosis without confirmation by                          other medically established means.                          Performed at [facility]  . ANA 08/09/2013 POSITIVE* NEGATIVE Final   Performed at [facility]  . ANA Titer 1 08/09/2013 1:320* <1:40 Final   Comment: (NOTE)                          Reference Ranges:                          1:40 - 1:80 Weakly positive, usually not clinically significant.                          > or = to 1:160 Result may be clinically significant.                                                                                                 . ANA Pattern 1 08/09/2013 SPECKLED   Final   Performed at [facility]    PATHOLOGY: No new pathology.  Urinalysis No results found for this basename: colorurine,  appearanceur,  labspec,  phurine,  glucoseu,  hgbur,  bilirubinur,  ketonesur,  proteinur,  urobilinogen,  nitrite,  leukocytesur    RADIOGRAPHIC STUDIES: No results found.  ASSESSMENT:  #1. Leukopenia and thrombocytopenia secondary to problem mixed connective tissue disorder, ANA positive1:320. Differential diagnosis also includes systemic lupus erythematosus, Sjogren's syndrome, scleroderma, and polymyositis. #2. Symptomatic allergic rhinitis  #3. Stage II chronic kidney disease  #4. Essential hypertension.  #5. Glaucoma, on treatment.  #6. Failed right corneal transplant. #7. Diabetes mellitus, type II, without complications.    PLAN:  #1. Flonase inhaler 2 puffs in each nostril at bedtime. #2. Referral to rheumatology per her PCP. #3. Followup in 6 months with CBC and CRP.   All questions were answered. The patient knows to call the clinic with any problems, questions or concerns. We can certainly  see the patient much sooner if necessary.   I spent 25 minutes counseling the patient face to face. The total time spent in the appointment was 30 minutes.    Doroteo Bradford, MD 08/31/2013 7:32 AM  DISCLAIMER:  This note was dictated with voice recognition software.  Similar sounding words can inadvertently be transcribed inaccurately and may not be corrected upon review.

## 2013-08-30 NOTE — Patient Instructions (Signed)
..  [facility] Discharge Instructions  RECOMMENDATIONS MADE BY THE CONSULTANT AND ANY TEST RESULTS WILL BE SENT TO YOUR REFERRING PHYSICIAN.  Mixed connective tissue disorder  EXAM FINDINGS BY THE PHYSICIAN TODAY AND SIGNS OR SYMPTOMS TO REPORT TO CLINIC OR PRIMARY PHYSICIAN:  Exam and findings as discussed by Dr. Barnet Glasgow. flonase ordered  Consider rheumatology referral    INSTRUCTIONS/FOLLOW-UP: Lab work today and in 6 months   Thank you for choosing [facility] to provide your oncology and hematology care.  To afford each patient quality time with our providers, please arrive at least 15 minutes before your scheduled appointment time.  With your help, our goal is to use those 15 minutes to complete the necessary work-up to ensure our physicians have the information they need to help with your evaluation and healthcare recommendations.    Effective January 1st, 2014, we ask that you re-schedule your appointment with our physicians should you arrive 10 or more minutes late for your appointment.  We strive to give you quality time with our providers, and arriving late affects you and other patients whose appointments are after yours.    Again, thank you for choosing [facility].  Our hope is that these requests will decrease the amount of time that you wait before being seen by our physicians.       _____________________________________________________________  Should you have questions after your visit to [facility], please contact our office at [phone] between the hours of 8:30 a.m. and 4:30 p.m.  Voicemails left after 4:30 p.m. will not be returned until the following business day.  For prescription refill requests, have your pharmacy contact our office with your prescription refill request.    _______________________________________________________________  We hope that we have given you very good care.  You may  receive a patient satisfaction survey in the mail, please complete it and return it as soon as possible.  We value your feedback!  _______________________________________________________________  Have you asked about our STAR program?  STAR stands for Survivorship Training and Rehabilitation, and this is a nationally recognized cancer care program that focuses on survivorship and rehabilitation.  Cancer and cancer treatments may cause problems, such as, pain, making you feel tired and keeping you from doing the things that you need or want to do. Cancer rehabilitation can help. Our goal is to reduce these troubling effects and help you have the best quality of life possible.  You may receive a survey from a nurse that asks questions about your current state of health.  Based on the survey results, all eligible patients will be referred to the [facility] program for an evaluation so we can better serve you!  A frequently asked questions sheet is available upon request.

## 2014-02-22 ENCOUNTER — Other Ambulatory Visit (HOSPITAL_COMMUNITY): Payer: Self-pay

## 2014-02-22 DIAGNOSIS — D61818 Other pancytopenia: Secondary | ICD-10-CM

## 2014-02-25 ENCOUNTER — Encounter (HOSPITAL_COMMUNITY): Payer: Self-pay | Admitting: Oncology

## 2014-02-25 DIAGNOSIS — D61818 Other pancytopenia: Secondary | ICD-10-CM

## 2014-02-25 DIAGNOSIS — C9000 Multiple myeloma not having achieved remission: Secondary | ICD-10-CM

## 2014-02-25 DIAGNOSIS — D472 Monoclonal gammopathy: Secondary | ICD-10-CM

## 2014-02-25 HISTORY — DX: Monoclonal gammopathy: D47.2

## 2014-02-25 HISTORY — DX: Other pancytopenia: D61.818

## 2014-02-25 HISTORY — DX: Multiple myeloma not having achieved remission: C90.00

## 2014-02-25 NOTE — Assessment & Plan Note (Addendum)
Leukopenia, thrombocytopenia, and anemia secondary to likely mixed connective tissue disorder, ANA positive1:320. Differential diagnosis also includes systemic lupus erythematosus, Sjogren's syndrome, scleroderma, and polymyositis. Patient reports that he has not seen Rheumatology yet.  I will make sure he gets referred today. No hematologic intervention needed at this time.  Labs today: CBC diff, CMET, anemia panel, CRP, LDH.  Return in 4 months for return appointment.

## 2014-02-25 NOTE — Progress Notes (Signed)
Maurice Bogus, MD [place] [address]  Other pancytopenia - Plan: Vitamin B12, Folate, Iron and TIBC, Ferritin, Lactate dehydrogenase, Comprehensive metabolic panel  CURRENT THERAPY: Surveillance.  INTERVAL HISTORY: Maurice Cooper 81 y.o. male returns for followup of Leukopenia and thrombocytopenia secondary to problem mixed connective tissue disorder, ANA positive1:320. Differential diagnosis also includes systemic lupus erythematosus, Sjogren's syndrome, scleroderma, and polymyositis.   I personally reviewed and went over laboratory results with the patient.  The results are noted within this dictation.  Today, he denies any complaints and ROS questioning is negative.  He reports that he has not seen a rheumatologist to date (even though Dr. Idamae Lusher note reports he was going set up the referral).       Past Medical History  Diagnosis Date  . Diabetes mellitus   . Hypertension   . Hypercholesteremia   . Melanoma     lower back  . Other pancytopenia 02/25/2014    has Benign prostatic hypertrophy; Diabetes mellitus; Chronic kidney disease; Hypertension; Glaucoma associated with systemic syndrome; and Other pancytopenia on his problem list.     has No Known Allergies.  Mr. Capo does not currently have medications on file.  Past Surgical History  Procedure Laterality Date  . Cataract extraction Bilateral   . Corneal transplant Right   . Tonsillectomy    . Melanoma excision Left     left flank area 1980s  . Amputation finger / thumb Left     forefinger    Denies any headaches, dizziness, double vision, fevers, chills, night sweats, nausea, vomiting, diarrhea, constipation, chest pain, heart palpitations, shortness of breath, blood in stool, black tarry stool, urinary pain, urinary burning, urinary frequency, hematuria.   PHYSICAL EXAMINATION  ECOG PERFORMANCE STATUS: 0 - Asymptomatic  Filed Vitals:   02/28/14 1352  BP:  193/65  Pulse:   Temp:   Resp:     GENERAL:alert, no distress, well nourished, well developed, comfortable, cooperative, obese and smiling SKIN: skin color, texture, turgor are normal, no rashes or significant lesions HEAD: Normocephalic, No masses, lesions, tenderness or abnormalities EYES: PERRLA, Conjunctiva are pink and non-injected, left eye glaucoma EARS: External ears normal OROPHARYNX:mucous membranes are moist  NECK: supple, no adenopathy, thyroid normal size, non-tender, without nodularity, no stridor, non-tender, trachea midline LYMPH:  no palpable lymphadenopathy, no hepatosplenomegaly BREAST:not examined LUNGS: clear to auscultation and percussion HEART: regular rate & rhythm, no murmurs, no gallops, S1 normal and S2 normal ABDOMEN:abdomen soft, non-tender, normal bowel sounds, no masses or organomegaly and no hepatosplenomegaly BACK: Back symmetric, no curvature., No CVA tenderness EXTREMITIES:less then 2 second capillary refill, no joint deformities, effusion, or inflammation, no edema, no skin discoloration, no clubbing, no cyanosis  NEURO: alert & oriented x 3 with fluent speech, no focal motor/sensory deficits, gait normal   LABORATORY DATA: CBC    Component Value Date/Time   WBC 3.8* 02/28/2014 1328   RBC 4.20* 02/28/2014 1328   RBC 4.16* 08/09/2013 1542   HGB 13.1 02/28/2014 1328   HCT 39.2 02/28/2014 1328   PLT 157 02/28/2014 1328   MCV 93.3 02/28/2014 1328   MCH 31.2 02/28/2014 1328   MCHC 33.4 02/28/2014 1328   RDW 12.7 02/28/2014 1328   LYMPHSABS 1.6 02/28/2014 1328   MONOABS 0.4 02/28/2014 1328   EOSABS 0.2 02/28/2014 1328   BASOSABS 0.0 02/28/2014 1328      Chemistry      Component Value Date/Time  NA 139 08/09/2013 1542   K 3.8 08/09/2013 1542   CL 98 08/09/2013 1542   CO2 29 08/09/2013 1542   BUN 17 08/09/2013 1542   CREATININE 1.21 08/09/2013 1542      Component Value Date/Time   CALCIUM 9.3 08/09/2013 1542   ALKPHOS 76 08/09/2013  1542   AST 29 08/09/2013 1542   ALT 27 08/09/2013 1542   BILITOT 0.5 08/09/2013 1542       ASSESSMENT AND PLAN:  Other pancytopenia Leukopenia, thrombocytopenia, and anemia secondary to likely mixed connective tissue disorder, ANA positive1:320. Differential diagnosis also includes systemic lupus erythematosus, Sjogren's syndrome, scleroderma, and polymyositis. Patient reports that he has not seen Rheumatology yet.  I will make sure he gets referred today. No hematologic intervention needed at this time.  Labs today: CBC diff, CMET, anemia panel, CRP, LDH.  Return in 4 months for return appointment.   THERAPY PLAN:  I will get him to see Rheumatology within the next 4 months and he will return in 4 months for follow-up.  All questions were answered. The patient knows to call the clinic with any problems, questions or concerns. We can certainly see the patient much sooner if necessary.  Patient and plan discussed with Dr. Ancil Linsey and she is in agreement with the aforementioned.   KEFALAS,THOMAS 02/28/2014

## 2014-02-28 ENCOUNTER — Encounter (HOSPITAL_COMMUNITY): Payer: 59 | Attending: Oncology | Admitting: Oncology

## 2014-02-28 ENCOUNTER — Encounter (HOSPITAL_COMMUNITY): Payer: 59

## 2014-02-28 ENCOUNTER — Encounter (HOSPITAL_COMMUNITY): Payer: Self-pay | Admitting: Oncology

## 2014-02-28 ENCOUNTER — Encounter (HOSPITAL_COMMUNITY): Payer: Self-pay | Admitting: Lab

## 2014-02-28 VITALS — BP 193/65 | HR 54 | Temp 97.8°F | Resp 18 | Wt 178.6 lb

## 2014-02-28 DIAGNOSIS — D72819 Decreased white blood cell count, unspecified: Secondary | ICD-10-CM | POA: Insufficient documentation

## 2014-02-28 DIAGNOSIS — D61818 Other pancytopenia: Secondary | ICD-10-CM

## 2014-02-28 DIAGNOSIS — D649 Anemia, unspecified: Secondary | ICD-10-CM | POA: Insufficient documentation

## 2014-02-28 LAB — CBC WITH DIFFERENTIAL/PLATELET
Basophils Absolute: 0 10*3/uL (ref 0.0–0.1)
Basophils Relative: 0 % (ref 0–1)
Eosinophils Absolute: 0.2 10*3/uL (ref 0.0–0.7)
Eosinophils Relative: 4 % (ref 0–5)
HCT: 39.2 % (ref 39.0–52.0)
HEMOGLOBIN: 13.1 g/dL (ref 13.0–17.0)
LYMPHS PCT: 42 % (ref 12–46)
Lymphs Abs: 1.6 10*3/uL (ref 0.7–4.0)
MCH: 31.2 pg (ref 26.0–34.0)
MCHC: 33.4 g/dL (ref 30.0–36.0)
MCV: 93.3 fL (ref 78.0–100.0)
MONOS PCT: 10 % (ref 3–12)
Monocytes Absolute: 0.4 10*3/uL (ref 0.1–1.0)
NEUTROS ABS: 1.7 10*3/uL (ref 1.7–7.7)
NEUTROS PCT: 44 % (ref 43–77)
Platelets: 157 10*3/uL (ref 150–400)
RBC: 4.2 MIL/uL — AB (ref 4.22–5.81)
RDW: 12.7 % (ref 11.5–15.5)
WBC: 3.8 10*3/uL — ABNORMAL LOW (ref 4.0–10.5)

## 2014-02-28 LAB — COMPREHENSIVE METABOLIC PANEL WITH GFR
ALT: 25 U/L (ref 0–53)
AST: 28 U/L (ref 0–37)
Albumin: 4.5 g/dL (ref 3.5–5.2)
Alkaline Phosphatase: 60 U/L (ref 39–117)
Anion gap: 3 — ABNORMAL LOW (ref 5–15)
BUN: 23 mg/dL (ref 6–23)
CO2: 33 mmol/L — ABNORMAL HIGH (ref 19–32)
Calcium: 9.3 mg/dL (ref 8.4–10.5)
Chloride: 100 mmol/L (ref 96–112)
Creatinine, Ser: 1.2 mg/dL (ref 0.50–1.35)
GFR calc Af Amer: 64 mL/min — ABNORMAL LOW
GFR calc non Af Amer: 55 mL/min — ABNORMAL LOW
Glucose, Bld: 140 mg/dL — ABNORMAL HIGH (ref 70–99)
Potassium: 3.9 mmol/L (ref 3.5–5.1)
Sodium: 136 mmol/L (ref 135–145)
Total Bilirubin: 0.7 mg/dL (ref 0.3–1.2)
Total Protein: 8.7 g/dL — ABNORMAL HIGH (ref 6.0–8.3)

## 2014-02-28 LAB — IRON AND TIBC
Iron: 90 ug/dL (ref 42–165)
Saturation Ratios: 27 % (ref 20–55)
TIBC: 336 ug/dL (ref 215–435)
UIBC: 246 ug/dL (ref 125–400)

## 2014-02-28 LAB — VITAMIN B12: Vitamin B-12: 919 pg/mL — ABNORMAL HIGH (ref 211–911)

## 2014-02-28 LAB — C-REACTIVE PROTEIN: CRP: <0.5 mg/dL — ABNORMAL LOW

## 2014-02-28 LAB — FOLATE

## 2014-02-28 LAB — FERRITIN: Ferritin: 97 ng/mL (ref 22–322)

## 2014-02-28 LAB — LACTATE DEHYDROGENASE: LDH: 143 U/L (ref 94–250)

## 2014-02-28 NOTE — Progress Notes (Signed)
Labs for ferr,iron/tibc,cbcd,cmp,crp,b12,fol,ldh,

## 2014-02-28 NOTE — Progress Notes (Signed)
Referral made to Rheumatology/ Dr Gavin Pound.  Records faxed and sent to office on 1/27.  They will  contact patient with appt.

## 2014-02-28 NOTE — Patient Instructions (Signed)
Maurice Cooper at [facility]  Discharge Instructions:  Labs performed today.  Most are pending.   We will refer you to a Rheumatologist. Return in 4 months for follow-up.  Labs can be ordered at that time.  CBC    Component Value Date/Time   WBC 3.8* 02/28/2014 1328   RBC 4.20* 02/28/2014 1328   RBC 4.16* 08/09/2013 1542   HGB 13.1 02/28/2014 1328   HCT 39.2 02/28/2014 1328   PLT 157 02/28/2014 1328   MCV 93.3 02/28/2014 1328   MCH 31.2 02/28/2014 1328   MCHC 33.4 02/28/2014 1328   RDW 12.7 02/28/2014 1328   LYMPHSABS 1.6 02/28/2014 1328   MONOABS 0.4 02/28/2014 1328   EOSABS 0.2 02/28/2014 1328   BASOSABS 0.0 02/28/2014 1328     _______________________________________________________________  Thank you for choosing [facility] at [facility] to provide your oncology and hematology care.  To afford each patient quality time with our providers, please arrive at least 15 minutes before your scheduled appointment.  You need to re-schedule your appointment if you arrive 10 or more minutes late.  We strive to give you quality time with our providers, and arriving late affects you and other patients whose appointments are after yours.  Also, if you no show three or more times for appointments you may be dismissed from the clinic.  Again, thank you for choosing [facility] at [facility] hope is that these requests will allow you access to exceptional care and in a timely manner. _______________________________________________________________  If you have questions after your visit, please contact our office at [phone] between the hours of 8:30 a.m. and 5:00 p.m. Voicemails left after 4:30 p.m. will not be returned until the following business day. _______________________________________________________________  For prescription refill requests, have your pharmacy contact our  office. _______________________________________________________________  Recommendations made by the consultant and any test results will be sent to your referring physician. _______________________________________________________________

## 2014-03-05 ENCOUNTER — Encounter (HOSPITAL_COMMUNITY): Payer: Self-pay | Admitting: Lab

## 2014-03-05 NOTE — Progress Notes (Signed)
Referral appt made for Dr Trudie Reed Rheumatology at [facility]/ [phone].  Appointment is 04/02/14 @10 :30.  Patient aware of appt and time.

## 2014-04-02 DIAGNOSIS — D61818 Other pancytopenia: Secondary | ICD-10-CM | POA: Diagnosis not present

## 2014-04-02 DIAGNOSIS — B370 Candidal stomatitis: Secondary | ICD-10-CM | POA: Diagnosis not present

## 2014-04-02 DIAGNOSIS — B3783 Candidal cheilitis: Secondary | ICD-10-CM | POA: Diagnosis not present

## 2014-04-02 DIAGNOSIS — R768 Other specified abnormal immunological findings in serum: Secondary | ICD-10-CM | POA: Diagnosis not present

## 2014-04-09 ENCOUNTER — Other Ambulatory Visit (HOSPITAL_COMMUNITY): Payer: Self-pay | Admitting: Oncology

## 2014-04-13 DIAGNOSIS — Z79899 Other long term (current) drug therapy: Secondary | ICD-10-CM | POA: Diagnosis not present

## 2014-04-13 DIAGNOSIS — I119 Hypertensive heart disease without heart failure: Secondary | ICD-10-CM | POA: Diagnosis not present

## 2014-04-13 DIAGNOSIS — E785 Hyperlipidemia, unspecified: Secondary | ICD-10-CM | POA: Diagnosis not present

## 2014-04-13 DIAGNOSIS — I10 Essential (primary) hypertension: Secondary | ICD-10-CM | POA: Diagnosis not present

## 2014-04-13 DIAGNOSIS — E119 Type 2 diabetes mellitus without complications: Secondary | ICD-10-CM | POA: Diagnosis not present

## 2014-04-19 DIAGNOSIS — D61818 Other pancytopenia: Secondary | ICD-10-CM | POA: Diagnosis not present

## 2014-04-19 DIAGNOSIS — E1129 Type 2 diabetes mellitus with other diabetic kidney complication: Secondary | ICD-10-CM | POA: Diagnosis not present

## 2014-04-19 DIAGNOSIS — N401 Enlarged prostate with lower urinary tract symptoms: Secondary | ICD-10-CM | POA: Diagnosis not present

## 2014-04-19 DIAGNOSIS — I10 Essential (primary) hypertension: Secondary | ICD-10-CM | POA: Diagnosis not present

## 2014-04-20 ENCOUNTER — Encounter (HOSPITAL_COMMUNITY): Payer: Medicare Other | Attending: Oncology | Admitting: Oncology

## 2014-04-20 VITALS — BP 161/68 | HR 57 | Temp 98.5°F | Resp 18 | Wt 179.0 lb

## 2014-04-20 DIAGNOSIS — D72819 Decreased white blood cell count, unspecified: Secondary | ICD-10-CM | POA: Insufficient documentation

## 2014-04-20 DIAGNOSIS — D649 Anemia, unspecified: Secondary | ICD-10-CM | POA: Insufficient documentation

## 2014-04-20 DIAGNOSIS — D61818 Other pancytopenia: Secondary | ICD-10-CM

## 2014-04-20 LAB — COMPREHENSIVE METABOLIC PANEL
ALT: 24 U/L (ref 0–53)
AST: 27 U/L (ref 0–37)
Albumin: 4.5 g/dL (ref 3.5–5.2)
Alkaline Phosphatase: 61 U/L (ref 39–117)
Anion gap: 7 (ref 5–15)
BUN: 25 mg/dL — ABNORMAL HIGH (ref 6–23)
CO2: 29 mmol/L (ref 19–32)
Calcium: 9.4 mg/dL (ref 8.4–10.5)
Chloride: 101 mmol/L (ref 96–112)
Creatinine, Ser: 1.25 mg/dL (ref 0.50–1.35)
GFR calc Af Amer: 61 mL/min — ABNORMAL LOW (ref >90)
GFR, EST NON AFRICAN AMERICAN: 53 mL/min — AB (ref >90)
Glucose, Bld: 133 mg/dL — ABNORMAL HIGH (ref 70–99)
Potassium: 3.3 mmol/L — ABNORMAL LOW (ref 3.5–5.1)
SODIUM: 137 mmol/L (ref 135–145)
Total Bilirubin: 0.7 mg/dL (ref 0.3–1.2)
Total Protein: 8.6 g/dL — ABNORMAL HIGH (ref 6.0–8.3)

## 2014-04-20 LAB — CBC WITH DIFFERENTIAL/PLATELET
BASOS ABS: 0 10*3/uL (ref 0.0–0.1)
Basophils Relative: 0 % (ref 0–1)
EOS ABS: 0.1 10*3/uL (ref 0.0–0.7)
EOS PCT: 4 % (ref 0–5)
HEMATOCRIT: 36.5 % — AB (ref 39.0–52.0)
HEMOGLOBIN: 12.7 g/dL — AB (ref 13.0–17.0)
Lymphocytes Relative: 46 % (ref 12–46)
Lymphs Abs: 1.5 10*3/uL (ref 0.7–4.0)
MCH: 32.1 pg (ref 26.0–34.0)
MCHC: 34.8 g/dL (ref 30.0–36.0)
MCV: 92.2 fL (ref 78.0–100.0)
MONOS PCT: 9 % (ref 3–12)
Monocytes Absolute: 0.3 10*3/uL (ref 0.1–1.0)
Neutro Abs: 1.3 10*3/uL — ABNORMAL LOW (ref 1.7–7.7)
Neutrophils Relative %: 41 % — ABNORMAL LOW (ref 43–77)
Platelets: 146 10*3/uL — ABNORMAL LOW (ref 150–400)
RBC: 3.96 MIL/uL — ABNORMAL LOW (ref 4.22–5.81)
RDW: 12.7 % (ref 11.5–15.5)
WBC: 3.3 10*3/uL — ABNORMAL LOW (ref 4.0–10.5)

## 2014-04-20 LAB — LACTATE DEHYDROGENASE: LDH: 174 U/L (ref 94–250)

## 2014-04-20 NOTE — Assessment & Plan Note (Signed)
Pancytopenia with negative Rheumatologic work-up for ANA positivity but M-Spike noted on SPEP.  Patient provided education regarding further work-up today.  Labs today: CBC diff, CMET, SPEP+IFE, B2M, LDH, UPEP+IFE, 24 hour urine collection for CrCl and protein.  Other study including skeletal survey within the next 2 weeks.  Return in 2 weeks for follow-up and discussion regarding findings.

## 2014-04-20 NOTE — Progress Notes (Signed)
[name], MD [place] [address]  Other pancytopenia - Plan: CBC with Differential, Comprehensive metabolic panel, Lactate dehydrogenase, Beta 2 microglobuline, serum, Protein electrophoresis, urine, Protein, urine, 24 hour, Creatinine clearance, urine, 24 hour, Immunofixation, urine, DG Bone Survey Met, CBC with Differential, Comprehensive metabolic panel, Lactate dehydrogenase, Beta 2 microglobuline, serum  CURRENT THERAPY: Further work-up  INTERVAL HISTORY: [name] 81 y.o. male returns for followup of (per Dr. Barnet Glasgow) Leukopenia and thrombocytopenia secondary to problem mixed connective tissue disorder, ANA positive1:320.  He was seen by Rheumatology and further work-up was performed including SPEP and IFE that demonstrated an M-Spike.  Without signs of symptoms of Rheumatologic disorder, patient is being seen back sooner than previously planned for further work-up in light of SPEP+IFE results performed by rheumatology.  Marella Chimes called from Dr. Trudie Reed' office reports results of lab testing performed in light of ANA positivity.  Due to negativity of these tests and lack of symptoms, it is felt that the patient does not likely have a rheumatologic disorder.  However, part of the work-up included a SPEP resulting in an M-Spike.  Junie Panning was nice enough to call me with that information and I am appreciative of our discussion.  I worked Mr. Delcastillo in sooner than previously expected for further work-up given this finding.    I personally reviewed and went over laboratory results with the patient.  The results are noted within this dictation.  Mr. Belshe still denies any complaints and B symptoms.  He reports that he feels good and looks good.   Hematologic ROS questioning is negative.    Past Medical History  Diagnosis Date  . Diabetes mellitus   . Hypertension   . Hypercholesteremia   . Melanoma     lower back  . Other pancytopenia  02/25/2014    has Benign prostatic hypertrophy; Diabetes mellitus; Chronic kidney disease; Hypertension; Glaucoma associated with systemic syndrome; and Other pancytopenia on his problem list.     has No Known Allergies.  Mr. Meisenheimer does not currently have medications on file.  Past Surgical History  Procedure Laterality Date  . Cataract extraction Bilateral   . Corneal transplant Right   . Tonsillectomy    . Melanoma excision Left     left flank area 1980s  . Amputation finger / thumb Left     forefinger    Denies any headaches, dizziness, double vision, fevers, chills, night sweats, nausea, vomiting, diarrhea, constipation, chest pain, heart palpitations, shortness of breath, blood in stool, black tarry stool, urinary pain, urinary burning, urinary frequency, hematuria.   PHYSICAL EXAMINATION  ECOG PERFORMANCE STATUS: 0 - Asymptomatic  Filed Vitals:   04/20/14 1350  BP: 161/68  Pulse: 57  Temp: 98.5 F (36.9 C)  Resp: 18    GENERAL:alert, no distress, well nourished, well developed, comfortable, cooperative and smiling SKIN: skin color, texture, turgor are normal, no rashes or significant lesions HEAD: Normocephalic, No masses, lesions, tenderness or abnormalities EYES: normal, PERRLA, EOMI, Conjunctiva are pink and non-injected EARS: External ears normal OROPHARYNX:lips, buccal mucosa, and tongue normal and mucous membranes are moist  NECK: supple, trachea midline LYMPH:  not examined BREAST:not examined LUNGS: not exmained HEART: not examined ABDOMEN:not examined BACK: Back symmetric, no curvature. EXTREMITIES:less then 2 second capillary refill, no joint deformities, effusion, or inflammation, no skin discoloration, no cyanosis  NEURO: alert & oriented x 3 with fluent speech, no focal motor/sensory deficits, gait normal  LABORATORY DATA: CBC    Component Value Date/Time   WBC 3.8* 02/28/2014 1328   RBC 4.20* 02/28/2014 1328   RBC 4.16* 08/09/2013 1542    HGB 13.1 02/28/2014 1328   HCT 39.2 02/28/2014 1328   PLT 157 02/28/2014 1328   MCV 93.3 02/28/2014 1328   MCH 31.2 02/28/2014 1328   MCHC 33.4 02/28/2014 1328   RDW 12.7 02/28/2014 1328   LYMPHSABS 1.6 02/28/2014 1328   MONOABS 0.4 02/28/2014 1328   EOSABS 0.2 02/28/2014 1328   BASOSABS 0.0 02/28/2014 1328      Chemistry      Component Value Date/Time   NA 136 02/28/2014 1328   K 3.9 02/28/2014 1328   CL 100 02/28/2014 1328   CO2 33* 02/28/2014 1328   BUN 23 02/28/2014 1328   CREATININE 1.20 02/28/2014 1328      Component Value Date/Time   CALCIUM 9.3 02/28/2014 1328   ALKPHOS 60 02/28/2014 1328   AST 28 02/28/2014 1328   ALT 25 02/28/2014 1328   BILITOT 0.7 02/28/2014 1328       ASSESSMENT AND PLAN:  Other pancytopenia Pancytopenia with negative Rheumatologic work-up for ANA positivity but M-Spike noted on SPEP.  Patient provided education regarding further work-up today.  Labs today: CBC diff, CMET, SPEP+IFE, B2M, LDH, UPEP+IFE, 24 hour urine collection for CrCl and protein.  Other study including skeletal survey within the next 2 weeks.  Return in 2 weeks for follow-up and discussion regarding findings.      THERAPY PLAN:  Work-up as described above.  All questions were answered. The patient knows to call the clinic with any problems, questions or concerns. We can certainly see the patient much sooner if necessary.  Patient and plan discussed with Dr. Ancil Linsey and she is in agreement with the aforementioned.   This note is electronically signed by: Robynn Pane 04/20/2014 2:55 PM

## 2014-04-20 NOTE — Patient Instructions (Signed)
[place] at [facility] Discharge Instructions  RECOMMENDATIONS MADE BY THE CONSULTANT AND ANY TEST RESULTS WILL BE SENT TO YOUR REFERRING PHYSICIAN.  Return in two weeks for office visit with Dr. Whitney Muse. Have bone survey done prior to your next visit with [place].  Thank you for choosing [place] at [facility] to provide your oncology and hematology care.  To afford each patient quality time with our provider, please arrive at least 15 minutes before your scheduled appointment time.    You need to re-schedule your appointment should you arrive 10 or more minutes late.  We strive to give you quality time with our providers, and arriving late affects you and other patients whose appointments are after yours.  Also, if you no show three or more times for appointments you may be dismissed from the clinic at the providers discretion.     Again, thank you for choosing [facility].  Our hope is that these requests will decrease the amount of time that you wait before being seen by our physicians.       _____________________________________________________________  Should you have questions after your visit to [facility], please contact our office at [phone] between the hours of 8:30 a.m. and 4:30 p.m.  Voicemails left after 4:30 p.m. will not be returned until the following business day.  For prescription refill requests, have your pharmacy contact our office.   24-Hour Urine Collection HOME CARE  When you get up in the morning on the day you do this test, pee (urinate) in the toilet and flush. Make a note of the time. This will be your start time on the day of collection and the end time on the next morning.  From then on, save all your pee (urine) in the plastic jug that was given to you.  You should stop collecting your pee 24 hours after you started.  If the plastic jug that is given to you already has liquid in  it, that is okay. Do not throw out the liquid or rinse out the jug. Some tests need the liquid to be added to your pee.  Keep your plastic jug cool (in an ice chest or the refrigerator) during the test.  When the 24 hours is over, bring your plastic jug to the clinic lab. Keep the jug cool (in an ice chest) while you are bringing it to the lab. Document Released: 04/17/2008 Document Revised: 04/13/2011 Document Reviewed: 04/17/2008 [facility] Patient Information 2015 [place], [place]. This information is not intended to replace advice given to you by your health care provider. Make sure you discuss any questions you have with your health care provider.

## 2014-04-21 LAB — BETA 2 MICROGLOBULIN, SERUM: Beta-2 Microglobulin: 2.3 mg/L (ref 0.6–2.4)

## 2014-04-22 DIAGNOSIS — D61818 Other pancytopenia: Secondary | ICD-10-CM | POA: Diagnosis not present

## 2014-04-22 DIAGNOSIS — D72819 Decreased white blood cell count, unspecified: Secondary | ICD-10-CM | POA: Diagnosis not present

## 2014-04-22 DIAGNOSIS — D649 Anemia, unspecified: Secondary | ICD-10-CM | POA: Diagnosis not present

## 2014-04-23 ENCOUNTER — Encounter (HOSPITAL_BASED_OUTPATIENT_CLINIC_OR_DEPARTMENT_OTHER): Payer: Medicare Other

## 2014-04-23 ENCOUNTER — Ambulatory Visit (HOSPITAL_COMMUNITY)
Admission: RE | Admit: 2014-04-23 | Discharge: 2014-04-23 | Disposition: A | Discharge status: Home / Self Care | Payer: Medicare Other | Source: Ambulatory Visit | Attending: Oncology | Admitting: Oncology

## 2014-04-23 DIAGNOSIS — Z85820 Personal history of malignant melanoma of skin: Secondary | ICD-10-CM | POA: Diagnosis not present

## 2014-04-23 DIAGNOSIS — D61818 Other pancytopenia: Secondary | ICD-10-CM | POA: Diagnosis not present

## 2014-04-23 LAB — PROTEIN, URINE, 24 HOUR
Collection Interval-UPROT: 24 hours
Protein, 24H Urine: 137 mg/d — ABNORMAL HIGH (ref 50–100)
Protein, Urine: 9 mg/dL
Urine Total Volume-UPROT: 1525 mL

## 2014-04-23 LAB — CREATININE CLEARANCE, URINE, 24 HOUR
Collection Interval-CRCL: 24 hours
Creatinine Clearance: 64 mL/min — ABNORMAL LOW (ref 75–125)
Creatinine, 24H Ur: 1148 mg/d (ref 800–2000)
Creatinine, Urine: 75.31 mg/dL
Urine Total Volume-CRCL: 1525 mL

## 2014-04-23 NOTE — Progress Notes (Addendum)
24 hour urine dropped off.

## 2014-04-25 LAB — UIFE/LIGHT CHAINS/TP QN, 24-HR UR
ALPHA 2 UR: DETECTED — AB
Albumin, U: DETECTED
Alpha 1, Urine: DETECTED — AB
BETA UR: DETECTED — AB
Gamma Globulin, Urine: DETECTED — AB
Time: 24 hours
Total Protein, Urine-Ur/day: 153 mg/d — ABNORMAL HIGH (ref <150)
Total Protein, Urine: 10 mg/dL (ref 5–25)
Volume, Urine: 1525 mL

## 2014-04-25 LAB — IMMUNOFIXATION, URINE

## 2014-05-02 ENCOUNTER — Encounter (HOSPITAL_BASED_OUTPATIENT_CLINIC_OR_DEPARTMENT_OTHER): Payer: Medicare Other | Admitting: Hematology & Oncology

## 2014-05-02 VITALS — BP 169/65 | HR 58 | Temp 98.0°F | Resp 16 | Wt 179.4 lb

## 2014-05-02 DIAGNOSIS — N182 Chronic kidney disease, stage 2 (mild): Secondary | ICD-10-CM

## 2014-05-02 DIAGNOSIS — E119 Type 2 diabetes mellitus without complications: Secondary | ICD-10-CM

## 2014-05-02 DIAGNOSIS — D472 Monoclonal gammopathy: Secondary | ICD-10-CM | POA: Diagnosis not present

## 2014-05-02 DIAGNOSIS — I10 Essential (primary) hypertension: Secondary | ICD-10-CM

## 2014-05-02 DIAGNOSIS — D61818 Other pancytopenia: Secondary | ICD-10-CM | POA: Diagnosis not present

## 2014-05-02 NOTE — Patient Instructions (Signed)
..  [place] at [facility] Discharge Instructions  RECOMMENDATIONS MADE BY THE CONSULTANT AND ANY TEST RESULTS WILL BE SENT TO YOUR REFERRING PHYSICIAN.  Exam per Dr. Whitney Muse  24 hr urine collection, please bring back about 1 week prior to f/u with labs in 60months  Thank you for choosing [place] at [facility] to provide your oncology and hematology care.  To afford each patient quality time with our provider, please arrive at least 15 minutes before your scheduled appointment time.    You need to re-schedule your appointment should you arrive 10 or more minutes late.  We strive to give you quality time with our providers, and arriving late affects you and other patients whose appointments are after yours.  Also, if you no show three or more times for appointments you may be dismissed from the clinic at the providers discretion.     Again, thank you for choosing [facility].  Our hope is that these requests will decrease the amount of time that you wait before being seen by our physicians.       _____________________________________________________________  Should you have questions after your visit to [facility], please contact our office at [phone] between the hours of 8:30 a.m. and 4:30 p.m.  Voicemails left after 4:30 p.m. will not be returned until the following business day.  For prescription refill requests, have your pharmacy contact our office.

## 2014-05-02 NOTE — Progress Notes (Signed)
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 male returns for followup of (per Dr. Barnet Glasgow) Leukopenia and thrombocytopenia secondary to problem mixed connective tissue disorder, ANA positive1:320.  He was seen by Rheumatology and further work-up was performed including SPEP and IFE that demonstrated an M-Spike.  Without signs of symptoms of Rheumatologic disorder, patient is being seen back sooner than previously planned for further work-up in light of SPEP+IFE results performed by rheumatology. He was seen by Kirby Crigler here at [facility] on 3/18. He was noted to have an M spike although the absolute not available for me to review today. He has undergone a myeloma survey which is negative. Urine studies do reveal a monoclonal IgG heavy chain with associated kappa light chain. He has an underlying pancytopenia which is mild and chronic   I personally reviewed and went over laboratory results with the patient.  The results are noted within this dictation.  Mr. Cifelli still denies any complaints and B symptoms.  He reports that he feels good and looks good.   Hematologic ROS questioning is negative.    Past Medical History  Diagnosis Date  . Diabetes mellitus   . Hypertension   . Hypercholesteremia   . Melanoma     lower back  . Other pancytopenia 02/25/2014    has Benign prostatic hypertrophy; Diabetes mellitus; Chronic kidney disease; Hypertension; Glaucoma associated with systemic syndrome; and Other pancytopenia on his problem list.     has No Known Allergies.  Mr. Anding does not currently have medications on file.  Past Surgical History  Procedure Laterality Date  .  Cataract extraction Bilateral   . Corneal transplant Right   . Tonsillectomy    . Melanoma excision Left     left flank area 1980s  . Amputation finger / thumb Left     forefinger    Denies any headaches, dizziness, double vision, fevers, chills, night sweats, nausea, vomiting, diarrhea, constipation, chest pain, heart palpitations, shortness of breath, blood in stool, black tarry stool, urinary pain, urinary burning, urinary frequency, hematuria.   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 BACK: Back symmetric, no curvature. EXTREMITIES:less then 2 second capillary refill, no joint deformities, effusion, or inflammation, no skin discoloration, no cyanosis  NEURO: alert & oriented x 3 with fluent speech, no focal motor/sensory deficits, gait normal   LABORATORY DATA: CBC    Component Value Date/Time   WBC 3.3* 04/20/2014 1440  RBC 3.96* 04/20/2014 1440   RBC 4.16* 08/09/2013 1542   HGB 12.7* 04/20/2014 1440   HCT 36.5* 04/20/2014 1440   PLT 146* 04/20/2014 1440   MCV 92.2 04/20/2014 1440   MCH 32.1 04/20/2014 1440   MCHC 34.8 04/20/2014  1440   RDW 12.7 04/20/2014 1440   LYMPHSABS 1.5 04/20/2014 1440   MONOABS 0.3 04/20/2014 1440   EOSABS 0.1 04/20/2014 1440   BASOSABS 0.0 04/20/2014 1440      Chemistry      Component Value Date/Time   NA 137 04/20/2014 1440   K 3.3* 04/20/2014 1440   CL 101 04/20/2014 1440   CO2 29 04/20/2014 1440   BUN 25* 04/20/2014 1440   CREATININE 1.25 04/20/2014 1440      Component Value Date/Time   CALCIUM 9.4 04/20/2014 1440   ALKPHOS 61 04/20/2014 1440   AST 27 04/20/2014 1440   ALT 24 04/20/2014 1440   BILITOT 0.7 04/20/2014 1440       ASSESSMENT AND PLAN:  MGUS Pancytopenia Positive ANA rheumatologic evaluation negative Chronic kidney disease, stage II  THERAPY PLAN:   I have recommended completing his peripheral evaluation for MGUS. And therefore we will proceed with an SPEP, IEP and light chain studies. Myeloma survey is negative. Urine studies are detailed above. Since I do not have his prior labs I advised him whether or not to pursue a bone marrow will depend on the degree of his M spike. I suspect it will be reasonable to proceed with surveillance. His chronic kidney disease is long-standing and stable and most likely secondary to his diabetes and hypertension. He has no evidence of hypercalcemia, he does not have severe anemia, and again myeloma survey is unremarkable. I will see him back again in 8 weeks to review all the results and we will come up with a formal follow-up plan at that point.   All questions were answered. The patient knows to call the clinic with any problems, questions or concerns. We can certainly see the patient much sooner if necessary.   This note is electronically signed by: Molli Hazard 05/02/2014 1:53 PM

## 2014-05-24 ENCOUNTER — Encounter (HOSPITAL_COMMUNITY): Payer: Self-pay | Admitting: Hematology & Oncology

## 2014-06-21 ENCOUNTER — Other Ambulatory Visit (HOSPITAL_COMMUNITY): Payer: Self-pay

## 2014-06-22 ENCOUNTER — Encounter (HOSPITAL_COMMUNITY): Payer: Medicare Other | Attending: Hematology & Oncology

## 2014-06-22 ENCOUNTER — Other Ambulatory Visit (HOSPITAL_COMMUNITY): Payer: Self-pay

## 2014-06-22 DIAGNOSIS — D472 Monoclonal gammopathy: Secondary | ICD-10-CM | POA: Insufficient documentation

## 2014-06-22 LAB — CBC WITH DIFFERENTIAL/PLATELET
BASOS ABS: 0 10*3/uL (ref 0.0–0.1)
Basophils Relative: 0 % (ref 0–1)
EOS ABS: 0.1 10*3/uL (ref 0.0–0.7)
EOS PCT: 4 % (ref 0–5)
HCT: 39.1 % (ref 39.0–52.0)
Hemoglobin: 13.6 g/dL (ref 13.0–17.0)
Lymphocytes Relative: 35 % (ref 12–46)
Lymphs Abs: 1.1 10*3/uL (ref 0.7–4.0)
MCH: 32.1 pg (ref 26.0–34.0)
MCHC: 34.8 g/dL (ref 30.0–36.0)
MCV: 92.2 fL (ref 78.0–100.0)
Monocytes Absolute: 0.2 10*3/uL (ref 0.1–1.0)
Monocytes Relative: 7 % (ref 3–12)
NEUTROS PCT: 54 % (ref 43–77)
Neutro Abs: 1.7 10*3/uL (ref 1.7–7.7)
Platelets: 170 10*3/uL (ref 150–400)
RBC: 4.24 MIL/uL (ref 4.22–5.81)
RDW: 12.7 % (ref 11.5–15.5)
WBC: 3.2 10*3/uL — ABNORMAL LOW (ref 4.0–10.5)

## 2014-06-22 LAB — COMPREHENSIVE METABOLIC PANEL
ALBUMIN: 4.5 g/dL (ref 3.5–5.0)
ALK PHOS: 56 U/L (ref 38–126)
ALT: 23 U/L (ref 17–63)
ANION GAP: 8 (ref 5–15)
AST: 28 U/L (ref 15–41)
BUN: 18 mg/dL (ref 6–20)
CO2: 30 mmol/L (ref 22–32)
Calcium: 9.3 mg/dL (ref 8.9–10.3)
Chloride: 99 mmol/L — ABNORMAL LOW (ref 101–111)
Creatinine, Ser: 1.23 mg/dL (ref 0.61–1.24)
GFR calc Af Amer: >60 mL/min (ref >60)
GFR calc non Af Amer: 54 mL/min — ABNORMAL LOW (ref >60)
GLUCOSE: 142 mg/dL — AB (ref 65–99)
Potassium: 3.3 mmol/L — ABNORMAL LOW (ref 3.5–5.1)
SODIUM: 137 mmol/L (ref 135–145)
TOTAL PROTEIN: 8.6 g/dL — AB (ref 6.5–8.1)
Total Bilirubin: 0.9 mg/dL (ref 0.3–1.2)

## 2014-06-22 NOTE — Progress Notes (Signed)
Labs drawn

## 2014-06-23 LAB — PROTEIN, URINE, 24 HOUR
Collection Interval-UPROT: 24 hours
Protein, 24H Urine: 95 mg/d (ref <150)
Protein, Urine: 4 mg/dL — ABNORMAL LOW (ref 5–25)
Urine Total Volume-UPROT: 2375 mL

## 2014-06-23 LAB — IGG, IGA, IGM
IGG (IMMUNOGLOBIN G), SERUM: 2419 mg/dL — AB (ref 700–1600)
IgA: 59 mg/dL — ABNORMAL LOW (ref 61–437)
IgM, Serum: 28 mg/dL (ref 15–143)

## 2014-06-24 LAB — KAPPA/LAMBDA LIGHT CHAINS
KAPPA FREE LGHT CHN: 17.71 mg/L (ref 3.30–19.40)
Kappa, lambda light chain ratio: 1.4 (ref 0.26–1.65)
Lambda free light chains: 12.68 mg/L (ref 5.71–26.30)

## 2014-06-25 LAB — UIFE/LIGHT CHAINS/TP QN, 24-HR UR
% BETA, Urine: 0 %
ALPHA 1 URINE: 0 %
Albumin, U: 100 %
Alpha 2, Urine: 0 %
Free Kappa/Lambda Ratio: 7.06 (ref 2.04–10.37)
Free Lambda Lt Chains,Ur: 1.25 mg/L (ref 0.24–6.66)
Free Lt Chn Excr Rate: 8.82 mg/L (ref 1.35–24.19)
GAMMA GLOBULIN URINE: 0 %
Time: 24 h
Total Protein, Urine-Ur/day: <95 mg/(24.h) (ref 30.0–150.0)
Total Protein, Urine: <4 mg/dL
Volume, Urine: 2375 mL

## 2014-06-25 LAB — IMMUNOFIXATION ELECTROPHORESIS
IgA: 60 mg/dL — ABNORMAL LOW (ref 61–437)
IgG (Immunoglobin G), Serum: 2436 mg/dL — ABNORMAL HIGH (ref 700–1600)
IgM, Serum: 29 mg/dL (ref 15–143)
Total Protein ELP: 8 g/dL (ref 6.0–8.5)

## 2014-06-25 LAB — PROTEIN ELECTROPHORESIS, SERUM
A/G RATIO SPE: 1.1 (ref 0.7–2.0)
ALBUMIN ELP: 4.1 g/dL (ref 3.2–5.6)
Alpha-1-Globulin: 0.3 g/dL (ref 0.1–0.4)
Alpha-2-Globulin: 0.6 g/dL (ref 0.4–1.2)
BETA GLOBULIN: 0.9 g/dL (ref 0.6–1.3)
Gamma Globulin: 2.2 g/dL — ABNORMAL HIGH (ref 0.5–1.6)
Globulin, Total: 3.9 g/dL (ref 2.0–4.5)
M-SPIKE, %: 1.8 g/dL — AB
Total Protein ELP: 8 g/dL (ref 6.0–8.5)

## 2014-06-25 LAB — BETA 2 MICROGLOBULIN, SERUM: Beta-2 Microglobulin: 2.5 mg/L — ABNORMAL HIGH (ref 0.6–2.4)

## 2014-06-29 ENCOUNTER — Ambulatory Visit (HOSPITAL_COMMUNITY): Payer: Self-pay | Admitting: Hematology & Oncology

## 2014-06-29 ENCOUNTER — Encounter (HOSPITAL_COMMUNITY): Payer: Self-pay | Admitting: Hematology & Oncology

## 2014-06-29 ENCOUNTER — Other Ambulatory Visit (HOSPITAL_COMMUNITY): Payer: Self-pay

## 2014-06-29 ENCOUNTER — Encounter (HOSPITAL_BASED_OUTPATIENT_CLINIC_OR_DEPARTMENT_OTHER): Payer: Medicare Other | Admitting: Hematology & Oncology

## 2014-06-29 VITALS — BP 156/76 | HR 66 | Temp 98.5°F | Resp 16 | Wt 177.5 lb

## 2014-06-29 DIAGNOSIS — D472 Monoclonal gammopathy: Secondary | ICD-10-CM

## 2014-06-29 DIAGNOSIS — D61818 Other pancytopenia: Secondary | ICD-10-CM

## 2014-06-29 DIAGNOSIS — N182 Chronic kidney disease, stage 2 (mild): Secondary | ICD-10-CM | POA: Diagnosis not present

## 2014-06-29 NOTE — Progress Notes (Signed)
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 male returns for followup of (per Dr. 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  He was seen by Rheumatology and further work-up was performed including SPEP and IFE that demonstrated an M-Spike.  Without signs of symptoms of Rheumatologic disorder, patient is being seen back sooner than previously planned for further work-up in light of SPEP+IFE results performed by rheumatology. He was seen by Kirby Crigler here at [facility] on 3/18. He was noted to have an M spike although the absolute value was not available for me to review today. He has undergone a myeloma survey which is negative. Urine studies do reveal a monoclonal IgG heavy chain with associated kappa light chain. He has an underlying pancytopenia which is mild and chronic  I personally reviewed and went over laboratory results with the patient.  The results are noted within this dictation.  Maurice Cooper still denies any complaints and B symptoms.  He reports that he feels good and looks good.   He is here today with his son. He is active. He does yard work including push mowing over an [employer] ([employer]). He has an excellent appetite.    Past Medical History  Diagnosis Date  . Diabetes mellitus   . Hypertension   . Hypercholesteremia   . Melanoma     lower back  . Other pancytopenia 02/25/2014    has Benign prostatic hypertrophy; Diabetes mellitus; Chronic kidney disease; Hypertension; Glaucoma associated with systemic syndrome; and Other pancytopenia on his problem list.     has No Known Allergies.  Maurice Cooper does not  currently have medications on file.  Past Surgical History  Procedure Laterality Date  . Cataract extraction Bilateral   . Corneal transplant Right   . Tonsillectomy    . Melanoma excision Left     left flank area 1980s  . Amputation finger / thumb Left     forefinger    Denies any headaches, dizziness, double vision, fevers, chills, night sweats, nausea, vomiting, diarrhea, constipation, chest pain, heart palpitations, shortness of breath, blood in stool, black tarry stool, urinary pain, urinary burning, urinary frequency, hematuria.   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 BACK: Back symmetric, no curvature. EXTREMITIES:less then 2 second capillary refill, no joint deformities, effusion, or inflammation, no skin discoloration, no cyanosis  NEURO: alert & oriented x 3 with fluent speech,  no focal motor/sensory deficits, gait normal   LABORATORY DATA: CBC    Component Value Date/Time   WBC 3.2* 06/22/2014 1236   RBC 4.24 06/22/2014 1236   RBC 4.16* 08/09/2013 1542   HGB 13.6 06/22/2014 1236   HCT 39.1 06/22/2014 1236   PLT 170  06/22/2014 1236   MCV 92.2 06/22/2014 1236   MCH 32.1 06/22/2014 1236   MCHC 34.8 06/22/2014 1236   RDW 12.7 06/22/2014 1236   LYMPHSABS 1.1 06/22/2014 1236   MONOABS 0.2 06/22/2014 1236   EOSABS 0.1 06/22/2014 1236   BASOSABS 0.0 06/22/2014 1236      Chemistry      Component Value Date/Time   NA 137 06/22/2014 1236   K 3.3* 06/22/2014 1236   CL 99* 06/22/2014 1236   CO2 30 06/22/2014 1236   BUN 18 06/22/2014 1236   CREATININE 1.23 06/22/2014 1236      Component Value Date/Time   CALCIUM 9.3 06/22/2014 1236   ALKPHOS 56 06/22/2014 1236   AST 28 06/22/2014 1236   ALT 23 06/22/2014 1236   BILITOT 0.9 06/22/2014 1236       ASSESSMENT AND PLAN:  MGUS Pancytopenia Positive ANA rheumatologic evaluation negative Chronic kidney disease, stage II  THERAPY PLAN:   After a long discussion with the patient and his son he has opted to proceed with a bone marrow biopsy. We discussed having it performed in [place] with mild sedation versus here in the office. He would prefer to stay locally and not travel to [place]. Therefore we will arrange for bone marrow biopsy here at the cancer center. We discussed the procedure in detail today including risks and benefits. The risks of procedure include, risk of bleeding, infection, and pain. The benefits of procedure include ruling out myeloma or other plasma cell disorders.  All questions were answered. The patient knows to call the clinic with any problems, questions or concerns. We can certainly see the patient much sooner if necessary.   This document serves as a record of services personally performed by Ancil Linsey, MD. It was created on her behalf by Arlyce Harman, a trained medical scribe. The creation of this record is based on the scribe's personal observations and the provider's statements to them. This document has been checked and approved by the attending provider.  I have reviewed the above documentation for accuracy  and completeness, and I agree with the above. This note was electronically signed.  Molli Hazard MD 07/10/2014 4:58 PM

## 2014-06-29 NOTE — Patient Instructions (Signed)
[place] at [facility] Discharge Instructions  RECOMMENDATIONS MADE BY THE CONSULTANT AND ANY TEST RESULTS WILL BE SENT TO YOUR REFERRING PHYSICIAN.  Exam and discussion by Dr. Whitney Muse. Will do bone marrow biopsy and aspiration here in the clinic and will see you a couple of weeks later to discuss test results.   Thank you for choosing [place] at [facility] to provide your oncology and hematology care.  To afford each patient quality time with our provider, please arrive at least 15 minutes before your scheduled appointment time.    You need to re-schedule your appointment should you arrive 10 or more minutes late.  We strive to give you quality time with our providers, and arriving late affects you and other patients whose appointments are after yours.  Also, if you no show three or more times for appointments you may be dismissed from the clinic at the providers discretion.     Again, thank you for choosing [facility].  Our hope is that these requests will decrease the amount of time that you wait before being seen by our physicians.       _____________________________________________________________  Should you have questions after your visit to [facility], please contact our office at [phone] between the hours of 8:30 a.m. and 4:30 p.m.  Voicemails left after 4:30 p.m. will not be returned until the following business day.  For prescription refill requests, have your pharmacy contact our office.

## 2014-07-10 ENCOUNTER — Encounter (HOSPITAL_COMMUNITY): Payer: Self-pay | Admitting: Hematology & Oncology

## 2014-07-18 DIAGNOSIS — H40002 Preglaucoma, unspecified, left eye: Secondary | ICD-10-CM | POA: Diagnosis not present

## 2014-07-18 DIAGNOSIS — H402213 Chronic angle-closure glaucoma, right eye, severe stage: Secondary | ICD-10-CM | POA: Diagnosis not present

## 2014-07-18 DIAGNOSIS — H548 Legal blindness, as defined in USA: Secondary | ICD-10-CM | POA: Diagnosis not present

## 2014-07-19 ENCOUNTER — Encounter (HOSPITAL_COMMUNITY): Payer: Medicare Other

## 2014-07-19 ENCOUNTER — Encounter (HOSPITAL_COMMUNITY): Payer: Medicare Other | Attending: Oncology | Admitting: Hematology & Oncology

## 2014-07-19 ENCOUNTER — Encounter (HOSPITAL_COMMUNITY): Payer: Self-pay | Admitting: Hematology & Oncology

## 2014-07-19 VITALS — BP 161/67 | HR 51 | Temp 97.6°F | Resp 16

## 2014-07-19 DIAGNOSIS — D472 Monoclonal gammopathy: Secondary | ICD-10-CM | POA: Diagnosis not present

## 2014-07-19 DIAGNOSIS — N182 Chronic kidney disease, stage 2 (mild): Secondary | ICD-10-CM | POA: Diagnosis not present

## 2014-07-19 DIAGNOSIS — N401 Enlarged prostate with lower urinary tract symptoms: Secondary | ICD-10-CM | POA: Diagnosis not present

## 2014-07-19 DIAGNOSIS — D72822 Plasmacytosis: Secondary | ICD-10-CM | POA: Diagnosis not present

## 2014-07-19 DIAGNOSIS — D649 Anemia, unspecified: Secondary | ICD-10-CM | POA: Insufficient documentation

## 2014-07-19 DIAGNOSIS — Z0000 Encounter for general adult medical examination without abnormal findings: Secondary | ICD-10-CM | POA: Diagnosis not present

## 2014-07-19 DIAGNOSIS — D709 Neutropenia, unspecified: Secondary | ICD-10-CM | POA: Diagnosis not present

## 2014-07-19 DIAGNOSIS — D61818 Other pancytopenia: Secondary | ICD-10-CM | POA: Diagnosis not present

## 2014-07-19 DIAGNOSIS — D72819 Decreased white blood cell count, unspecified: Secondary | ICD-10-CM | POA: Diagnosis not present

## 2014-07-19 DIAGNOSIS — E1129 Type 2 diabetes mellitus with other diabetic kidney complication: Secondary | ICD-10-CM | POA: Diagnosis not present

## 2014-07-19 DIAGNOSIS — N189 Chronic kidney disease, unspecified: Secondary | ICD-10-CM

## 2014-07-19 DIAGNOSIS — I10 Essential (primary) hypertension: Secondary | ICD-10-CM | POA: Diagnosis not present

## 2014-07-19 HISTORY — PX: BONE MARROW ASPIRATION: SHX1252

## 2014-07-19 HISTORY — PX: BONE MARROW BIOPSY: SHX199

## 2014-07-19 LAB — CBC WITH DIFFERENTIAL/PLATELET
BASOS ABS: 0 10*3/uL (ref 0.0–0.1)
Basophils Relative: 1 % (ref 0–1)
EOS PCT: 5 % (ref 0–5)
Eosinophils Absolute: 0.2 10*3/uL (ref 0.0–0.7)
HEMATOCRIT: 37.8 % — AB (ref 39.0–52.0)
HEMOGLOBIN: 13.3 g/dL (ref 13.0–17.0)
LYMPHS ABS: 1.7 10*3/uL (ref 0.7–4.0)
Lymphocytes Relative: 47 % — ABNORMAL HIGH (ref 12–46)
MCH: 32.4 pg (ref 26.0–34.0)
MCHC: 35.2 g/dL (ref 30.0–36.0)
MCV: 92 fL (ref 78.0–100.0)
MONO ABS: 0.3 10*3/uL (ref 0.1–1.0)
Monocytes Relative: 9 % (ref 3–12)
NEUTROS ABS: 1.4 10*3/uL — AB (ref 1.7–7.7)
Neutrophils Relative %: 38 % — ABNORMAL LOW (ref 43–77)
Platelets: 158 10*3/uL (ref 150–400)
RBC: 4.11 MIL/uL — AB (ref 4.22–5.81)
RDW: 12.8 % (ref 11.5–15.5)
WBC: 3.6 10*3/uL — AB (ref 4.0–10.5)

## 2014-07-19 LAB — BONE MARROW EXAM

## 2014-07-19 MED ORDER — HYDROCODONE-ACETAMINOPHEN 5-325 MG PO TABS: ORAL_TABLET | ORAL | Status: DC

## 2014-07-19 NOTE — Progress Notes (Signed)
[name] presented for labwork. Labs per MD order drawn via Peripheral Line 23 gauge needle inserted in left AC  Good blood return present. Procedure without incident.  Needle removed intact. Patient tolerated procedure well.   [place] PROGRESS NOTE  [name] presents for Bone Marrow biopsy per MD orders. [name] verbalized understanding of procedure. Consent reviewed and signed.  [name] positioned supine for procedure. Time-out performed and Bone Marrow Checklist. Procedure began at [phone]. Xylocaine 1% 10 cc used for local and administered to patient by Dr. Whitney Muse. Procedure completed at 0850. Patient tolerated well. Pressure dressing applied to the left hip with instructions to leave in place for 24 hours. Patient instructed to report any bleeding that saturates dressing and to take pain medication Hydrocodone/Acetaminophen 5-325  1/2 to 1 tablet as directed every 4 hours as needed for pain. Dressing dry and intact to the left hip on discharge.

## 2014-07-19 NOTE — Patient Instructions (Signed)
[facility] Discharge Instructions for Post Bone Marrow Procedure  Today you had a bone marrow biopsy and aspirate of the left hip   Please keep the pressure dressing in place and dry for at least 24 hours.  Have someone check your dressing periodically for bleeding.  If needed you can reapply a pressure dressing to the site.  Take pain medication Hydrocodone-Acetaminophen 5-325 and you can take 1/2 to 1 tablet as needed for pain. IF BLEEDING REOCCURS THAT SHOULD BE REPORTED IMMEDIATELY. Call the [place] at [phone] if during business hours. Or report to the Emergency Room.   I have been informed and understand all the instructions given to me. I know to contact the clinic, my physician, or go to the Emergency Department if any problems should occur. I do not have any questions at this time, but understand that I may call the clinic during office hours at [phone] should I have any questions or need assistance in obtaining follow up care.    __________________________________________  _____________  __________ Signature of Patient or Authorized Representative            Date                   Time    __________________________________________ Nurse's Signature

## 2014-07-19 NOTE — Progress Notes (Signed)
      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 male returns for followup of (per Dr. 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  He was seen by Rheumatology and further work-up was performed including SPEP and IFE that demonstrated an M-Spike.  Without signs of symptoms of Rheumatologic disorder, patient is being seen back sooner than previously planned for further work-up in light of SPEP+IFE results performed by rheumatology. He was seen by Tom Kefalas here at [facility] on 3/18. He was noted to have an M spike although the absolute value was not available for me to review. He has undergone a myeloma survey which is negative. Urine studies do reveal a monoclonal IgG heavy chain with associated kappa light chain. He has an underlying pancytopenia which is mild and chronic. He has CKD.  He is here today for BMBX. I again reviewed the risks and benefits of the procedure and he wishes to proceed.   Past Medical History  Diagnosis Date  . Diabetes mellitus   . Hypertension   . Hypercholesteremia   . Melanoma     lower back  . Other pancytopenia 02/25/2014    has Benign prostatic hypertrophy; Diabetes mellitus; Chronic kidney disease; Hypertension; Glaucoma associated with systemic syndrome; and Other pancytopenia on his problem list.     has No Known Allergies.  Mr. Haueter does not currently have medications on file.  Past Surgical History  Procedure Laterality Date  . Cataract extraction Bilateral   . Corneal transplant Right   . Tonsillectomy    . Melanoma excision Left     left flank area 1980s  . Amputation  finger / thumb Left     forefinger    Denies any headaches, dizziness, double vision, fevers, chills, night sweats, nausea, vomiting, diarrhea, constipation, chest pain, heart palpitations, shortness of breath, blood in stool, black tarry stool, urinary pain, urinary burning, urinary frequency, hematuria.  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 BACK: Back symmetric, no curvature. EXTREMITIES:less then 2 second capillary refill, no joint deformities, effusion, or inflammation, no skin discoloration, no cyanosis  NEURO: alert & oriented x 3 with fluent speech, no focal motor/sensory deficits, gait normal   LABORATORY DATA: CBC    Component Value Date/Time   WBC 3.6* 07/19/2014 0820   RBC   4.11* 07/19/2014 0820   RBC 4.16* 08/09/2013 1542   HGB 13.3 07/19/2014 0820   HCT 37.8* 07/19/2014 0820   PLT 158 07/19/2014 0820   MCV 92.0 07/19/2014 0820   MCH 32.4 07/19/2014 0820   MCHC 35.2 07/19/2014 0820   RDW 12.8 07/19/2014 0820    LYMPHSABS 1.7 07/19/2014 0820   MONOABS 0.3 07/19/2014 0820   EOSABS 0.2 07/19/2014 0820   BASOSABS 0.0 07/19/2014 0820      Chemistry      Component Value Date/Time   NA 137 06/22/2014 1236   K 3.3* 06/22/2014 1236   CL 99* 06/22/2014 1236   CO2 30 06/22/2014 1236   BUN 18 06/22/2014 1236   CREATININE 1.23 06/22/2014 1236      Component Value Date/Time   CALCIUM 9.3 06/22/2014 1236   ALKPHOS 56 06/22/2014 1236   AST 28 06/22/2014 1236   ALT 23 06/22/2014 1236   BILITOT 0.9 06/22/2014 1236      Bone Marrow Biopsy and Aspiration Procedure Note   Informed consent was obtained and potential risks including bleeding, infection and pain were reviewed with the patient. I verified that the patient has been fasting since midnight.  The patient's name, date of birth, identification, consent and allergies were verified prior to the start of procedure and time out was performed.  The left posterior iliac crest was chosen as the site of biopsy.  The skin was prepped with Betadine solution.   8 cc of 1% lidocaine was used to provide local anaesthesia.   10 cc of bone marrow aspirate was obtained followed by 1 inch biopsy.  Pressure was applied to the biopsy site and bandage was placed over the biopsy site. Patient was made to lie on back for 30 mins prior to discharge.  The procedure was tolerated well. COMPLICATIONS: None BLOOD LOSS: none The patient was discharged home in stable condition with a 1 week follow up to review results. He was provided with post bone marrow biopsy instructions and instructed to call if there was any bleeding or worsening pain.  Specimens sent for flow cytometry, cytogenetics and additional studies.   ASSESSMENT AND PLAN:  MGUS Pancytopenia Positive ANA rheumatologic evaluation negative Chronic kidney disease, stage II  THERAPY PLAN:   The patient will return within 2 weeks to review the results. I advised him should he develop any problems at  the biopsy site to notify us immediately.   This note was electronically signed.  This document serves as a record of services personally performed by Shannon Penland, MD. It was created on her behalf by Darielle Pickett, a trained medical scribe. The creation of this record is based on the scribe's personal observations and the provider's statements to them. This document has been checked and approved by the attending provider.  I have reviewed the above documentation for accuracy and completeness, and I agree with the above.  Shannon K. Penland, MD    

## 2014-07-20 NOTE — Progress Notes (Signed)
See office note

## 2014-07-24 DIAGNOSIS — Z0000 Encounter for general adult medical examination without abnormal findings: Secondary | ICD-10-CM | POA: Diagnosis not present

## 2014-07-24 DIAGNOSIS — Z1211 Encounter for screening for malignant neoplasm of colon: Secondary | ICD-10-CM | POA: Diagnosis not present

## 2014-07-31 LAB — CHROMOSOME ANALYSIS, BONE MARROW

## 2014-07-31 LAB — TISSUE HYBRIDIZATION (BONE MARROW)-NCBH

## 2014-08-01 DIAGNOSIS — R768 Other specified abnormal immunological findings in serum: Secondary | ICD-10-CM | POA: Diagnosis not present

## 2014-08-01 DIAGNOSIS — D61818 Other pancytopenia: Secondary | ICD-10-CM | POA: Diagnosis not present

## 2014-08-01 DIAGNOSIS — B370 Candidal stomatitis: Secondary | ICD-10-CM | POA: Diagnosis not present

## 2014-08-03 ENCOUNTER — Encounter (HOSPITAL_COMMUNITY): Payer: Medicare Other | Attending: Oncology | Admitting: Hematology & Oncology

## 2014-08-03 ENCOUNTER — Encounter (HOSPITAL_COMMUNITY): Payer: Self-pay | Admitting: Hematology & Oncology

## 2014-08-03 VITALS — BP 144/76 | HR 55 | Temp 97.8°F | Resp 16 | Wt 177.7 lb

## 2014-08-03 DIAGNOSIS — C9000 Multiple myeloma not having achieved remission: Secondary | ICD-10-CM | POA: Diagnosis not present

## 2014-08-03 DIAGNOSIS — N182 Chronic kidney disease, stage 2 (mild): Secondary | ICD-10-CM | POA: Diagnosis not present

## 2014-08-03 DIAGNOSIS — D61818 Other pancytopenia: Secondary | ICD-10-CM | POA: Diagnosis not present

## 2014-08-03 DIAGNOSIS — D472 Monoclonal gammopathy: Secondary | ICD-10-CM

## 2014-08-03 DIAGNOSIS — D649 Anemia, unspecified: Secondary | ICD-10-CM | POA: Insufficient documentation

## 2014-08-03 DIAGNOSIS — D72819 Decreased white blood cell count, unspecified: Secondary | ICD-10-CM | POA: Insufficient documentation

## 2014-08-03 NOTE — Patient Instructions (Signed)
[place] [facility] Discharge Instructions  RECOMMENDATIONS MADE BY THE CONSULTANT AND ANY TEST RESULTS WILL BE SENT TO YOUR REFERRING PHYSICIAN.  Exam and discussion by Dr. Whitney Muse. Because of your bone marrow results we need to do a PET Scan. PET scan will be done at [facility].  Nothing to eat or drink 6 hours prior to the test.  Do not eat or drink anything with sugar in it.  No chewing gum, hard candy, etc.  Follow-up after PET Scan.  Thank you for choosing [place] at [facility] to provide your oncology and hematology care.  To afford each patient quality time with our provider, please arrive at least 15 minutes before your scheduled appointment time.    You need to re-schedule your appointment should you arrive 10 or more minutes late.  We strive to give you quality time with our providers, and arriving late affects you and other patients whose appointments are after yours.  Also, if you no show three or more times for appointments you may be dismissed from the clinic at the providers discretion.     Again, thank you for choosing [facility].  Our hope is that these requests will decrease the amount of time that you wait before being seen by our physicians.       _____________________________________________________________  Should you have questions after your visit to [facility], please contact our office at [phone] between the hours of 8:30 a.m. and 4:30 p.m.  Voicemails left after 4:30 p.m. will not be returned until the following business day.  For prescription refill requests, have your pharmacy contact our office.

## 2014-08-03 NOTE — Progress Notes (Signed)
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 male returns for followup of (per Dr. 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  He was seen by Rheumatology and further work-up was performed including SPEP and IFE that demonstrated an M-Spike.  Without signs of symptoms of Rheumatologic disorder, patient is being seen back sooner than previously planned for further work-up in light of SPEP+IFE results performed by rheumatology. He was seen by Kirby Crigler here at [facility] on 3/18. He was noted to have an M spike although the absolute value was not available for me to review. He has undergone a myeloma survey which is negative. Urine studies do reveal a monoclonal IgG heavy chain with associated kappa light chain. He has an underlying pancytopenia which is mild and chronic. He has CKD.  He is here to review the results of his bone marrow biopsy. He has no persistent pain or issues with the biopsy site. He was curious about treatment options or if he needs any treatment.   Past Medical History  Diagnosis Date  . Diabetes mellitus   . Hypertension   . Hypercholesteremia   . Melanoma     lower back  . Other pancytopenia 02/25/2014  . 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     has No Known Allergies.  [name] does not currently have medications on file.  Past Surgical History  Procedure Laterality Date  . Cataract  extraction Bilateral   . Corneal transplant Right   . Tonsillectomy    . Melanoma excision Left     left flank area 1980s  . Amputation finger / thumb Left     forefinger  . Bone marrow biopsy Left 07/19/14  . Bone marrow aspiration Left 07/19/14    Denies any headaches, dizziness, double vision, fevers, chills, night sweats, nausea, vomiting, diarrhea, constipation, chest pain, heart palpitations, shortness of breath, blood in stool, black tarry stool, urinary pain, urinary burning, urinary frequency, hematuria.  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 BACK: Back symmetric, no curvature. BMBX site is examined, well healed, barely noticeable. No pain with palpation of  the site EXTREMITIES:less then 2 second capillary refill, no joint deformities, effusion, or inflammation, no skin discoloration, no cyanosis  NEURO: alert & oriented x 3 with fluent speech, no focal motor/sensory deficits, gait normal   LABORATORY DATA: CBC     Component Value Date/Time   WBC 3.6* 07/19/2014 0820   RBC 4.11* 07/19/2014 0820   RBC 4.16* 08/09/2013 1542   HGB 13.3 07/19/2014 0820   HCT 37.8* 07/19/2014 0820   PLT 158 07/19/2014 0820   MCV 92.0 07/19/2014 0820   MCH 32.4 07/19/2014 0820   MCHC 35.2 07/19/2014 0820   RDW 12.8 07/19/2014 0820   LYMPHSABS 1.7 07/19/2014 0820   MONOABS 0.3 07/19/2014 0820   EOSABS 0.2 07/19/2014 0820   BASOSABS 0.0 07/19/2014 0820      Chemistry      Component Value Date/Time   NA 137 06/22/2014 1236   K 3.3* 06/22/2014 1236   CL 99* 06/22/2014 1236   CO2 30 06/22/2014 1236   BUN 18 06/22/2014 1236   CREATININE 1.23 06/22/2014 1236      Component Value Date/Time   CALCIUM 9.3 06/22/2014 1236   ALKPHOS 56 06/22/2014 1236   AST 28 06/22/2014 1236   ALT 23 06/22/2014 1236   BILITOT 0.9 06/22/2014 1236     PATHOLOGY:  FINAL DIAGNOSIS Diagnosis Bone Marrow, Aspirate,Biopsy, and Clot - HYPERCELLULAR BONE MARROW FOR AGE WITH TRILINEAGE HEMATOPOIESIS. - PLASMACYTOSIS - SEE COMMENT. PERIPHERAL BLOOD: - NEUTROPENIA. Diagnosis Note The bone marrow material is generally limited with lack of adequate aspirate or optimal touch imprints for evaluation. The core biopsy is hypercellular for age with trilineage hematopoiesis. Immunohistochemical stains highlight an increased plasma cell component in the marrow which is estimated to average10% of all cells in the core biopsy. The plasma cells display kappa light chain excess/restriction consistent with plasma cell dyscrasia/neoplasm. Correlation with cytogenetic studies is recommended. (BNS:ecj/gt 07/20/2014) Susanne Greenhouse MD Pathologist, Electronic Signature (Case signed 07/23/2014)   NORMAL CYTOGENETICS   ASSESSMENT AND PLAN:  SMOLDERING MYELOMA, 10% PLASMA CELLS ON BMBX Pancytopenia Positive ANA rheumatologic evaluation negative Chronic kidney disease, stage II  THERAPY PLAN:   I discussed a diagnosis of smoldering myeloma.  We  reviewed the NCCN guidelines together in detail.  I have recommended a PET/CT for final evaluation. We discussed that should his PET/CT be negative for the presence of bone disease we will proceed with observation only.  He understands and agrees with the plan as outlined.    This note was electronically signed.  This document serves as a record of services personally performed by Ancil Linsey, MD. It was created on her behalf by Arlyce Harman, a trained medical scribe. The creation of this record is based on the scribe's personal observations and the provider's statements to them. This document has been checked and approved by the attending provider.  I have reviewed the above documentation for accuracy and completeness, and I agree with the above.  Kelby Fam. Whitney Muse, MD

## 2014-08-09 ENCOUNTER — Encounter (HOSPITAL_COMMUNITY): Payer: Self-pay

## 2014-08-14 ENCOUNTER — Other Ambulatory Visit (HOSPITAL_COMMUNITY): Payer: Self-pay | Admitting: Hematology & Oncology

## 2014-08-14 ENCOUNTER — Ambulatory Visit (HOSPITAL_COMMUNITY)
Admission: RE | Admit: 2014-08-14 | Discharge: 2014-08-14 | Disposition: A | Discharge status: Home / Self Care | Payer: Medicare Other | Source: Ambulatory Visit | Attending: Hematology & Oncology | Admitting: Hematology & Oncology

## 2014-08-14 DIAGNOSIS — I10 Essential (primary) hypertension: Secondary | ICD-10-CM | POA: Insufficient documentation

## 2014-08-14 DIAGNOSIS — I6523 Occlusion and stenosis of bilateral carotid arteries: Secondary | ICD-10-CM | POA: Insufficient documentation

## 2014-08-14 DIAGNOSIS — I2510 Atherosclerotic heart disease of native coronary artery without angina pectoris: Secondary | ICD-10-CM | POA: Diagnosis not present

## 2014-08-14 DIAGNOSIS — N433 Hydrocele, unspecified: Secondary | ICD-10-CM | POA: Insufficient documentation

## 2014-08-14 DIAGNOSIS — Z85820 Personal history of malignant melanoma of skin: Secondary | ICD-10-CM | POA: Diagnosis not present

## 2014-08-14 DIAGNOSIS — N400 Enlarged prostate without lower urinary tract symptoms: Secondary | ICD-10-CM | POA: Insufficient documentation

## 2014-08-14 DIAGNOSIS — C9000 Multiple myeloma not having achieved remission: Secondary | ICD-10-CM | POA: Insufficient documentation

## 2014-08-14 DIAGNOSIS — E119 Type 2 diabetes mellitus without complications: Secondary | ICD-10-CM | POA: Insufficient documentation

## 2014-08-14 DIAGNOSIS — R911 Solitary pulmonary nodule: Secondary | ICD-10-CM | POA: Insufficient documentation

## 2014-08-14 DIAGNOSIS — I517 Cardiomegaly: Secondary | ICD-10-CM | POA: Insufficient documentation

## 2014-08-14 DIAGNOSIS — D472 Monoclonal gammopathy: Secondary | ICD-10-CM

## 2014-08-14 LAB — GLUCOSE, CAPILLARY: Glucose-Capillary: 133 mg/dL — ABNORMAL HIGH (ref 65–99)

## 2014-08-14 MED ORDER — FLUDEOXYGLUCOSE F - 18 (FDG) INJECTION
9.3000 | Freq: Once | INTRAVENOUS | Status: AC | PRN
  Administered 2014-08-14: 9.3 via INTRAVENOUS

## 2014-08-16 ENCOUNTER — Encounter (HOSPITAL_BASED_OUTPATIENT_CLINIC_OR_DEPARTMENT_OTHER): Payer: Medicare Other | Admitting: Oncology

## 2014-08-16 ENCOUNTER — Ambulatory Visit (HOSPITAL_COMMUNITY): Payer: Medicare Other | Admitting: Oncology

## 2014-08-16 ENCOUNTER — Encounter (HOSPITAL_COMMUNITY): Payer: Self-pay | Admitting: Oncology

## 2014-08-16 ENCOUNTER — Ambulatory Visit (HOSPITAL_COMMUNITY): Payer: Medicare Other

## 2014-08-16 VITALS — BP 167/67 | HR 60 | Temp 98.1°F | Resp 18 | Wt 177.0 lb

## 2014-08-16 DIAGNOSIS — R911 Solitary pulmonary nodule: Secondary | ICD-10-CM

## 2014-08-16 DIAGNOSIS — C9000 Multiple myeloma not having achieved remission: Secondary | ICD-10-CM

## 2014-08-16 DIAGNOSIS — D472 Monoclonal gammopathy: Secondary | ICD-10-CM

## 2014-08-16 NOTE — Assessment & Plan Note (Addendum)
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  PET imaging did demonstrate a small 5 mm LLL pulmonary nodule requiring follow-up with CT in 6 months due to history of melanoma.  It is nonspecific.  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  Patient education is provided regarding smoldering myeloma.  Labs every 3 months: CBC diff, CMET, ESR, CRP, MM panel, B2M.  Return in 3 months for follow-up.

## 2014-08-16 NOTE — Progress Notes (Signed)
Maurice Bogus, MD [address] [place] [id number]  Smoldering multiple myeloma (SMM) - Plan: CBC with Differential, Comprehensive metabolic panel, Lactate dehydrogenase, Beta 2 microglobuline, serum, Sedimentation rate, C-reactive protein, Multiple myeloma panel, serum, Kappa/lambda light chains  Pulmonary nodule - Plan: CT Chest Wo Contrast  CURRENT THERAPY: Review of work-up for pancytopenia  INTERVAL HISTORY: Maurice Cooper 81 y.o. male returns for followup of smoldering multiple myeloma with a bone marrow biopsy on 07/19/14 demonstrating a 10% plasma cell with normal cytogenetics, negative PET scan on 08/14/2014 for lytic lesions, and an IgG of ~2400.  I personally reviewed and went over radiographic studies with the patient.  The results are noted within this dictation.  PET imaging earlier this week is negative for any lytic lesions, but a LLL pulmonary nodule was noted that requires attention at follow-up.  Smoldering multiple myeloma - Smoldering multiple myeloma (SMM) is defined as:  ?M-protein ?3 g/dL and/or 10 to 60 percent bone marrow plasma cells, plus  ?No end-organ damage or other myeloma-defining events, and no amyloidosis  Thus, for the diagnosis of SMM, patients should not have any of the following myeloma-defining events:  ?End-organ damage (lytic lesions, anemia, renal disease, or hypercalcemia) that can be attributed to the underlying plasma cell disorder  ??60 percent clonal plasma cells in the bone marrow   ?Involved/uninvolved free light chain (FLC) ratio of 100 or more   ?Magnetic resonance imaging (MRI) with more than one focal lesion (involving bone or bone marrow) Asymptomatic patients with one or more of the myeloma-defining events listed above are considered to have MM rather than SMM because they have a risk of progression with complications of greater than 80 percent within two years.  I have provided patient education regarding  smoldering myeloma.  All questions are answered.   Past Medical History  Diagnosis Date  . Diabetes mellitus   . Hypertension   . Hypercholesteremia   . Melanoma     lower back  . Other pancytopenia 02/25/2014  . 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     has No Known Allergies.  Current Outpatient Prescriptions on File Prior to Visit  Medication Sig Dispense Refill  . AMLODIPINE BESYLATE PO Take 10 mg by mouth daily.    Marland Kitchen aspirin EC 325 MG tablet Take 325 mg by mouth daily.    Marland Kitchen CINNAMON PO Take 1,000 mg by mouth daily.    Marland Kitchen DOXAZOSIN MESYLATE PO Take 4 mg by mouth daily.    . finasteride (PROSCAR) 5 MG tablet Take 5 mg by mouth daily.  0  . fluticasone (FLONASE) 50 MCG/ACT nasal spray 2 puffs in each nostril at bedtime. (Patient not taking: Reported on 07/19/2014) 16 g 2  . GLIPIZIDE ER PO Take 5 mg by mouth daily.    Marland Kitchen HYDROcodone-acetaminophen (NORCO/VICODIN) 5-325 MG per tablet Take 1/2 to 1 tablet as needed every 4 hours for pain (Patient not taking: Reported on 08/03/2014) 25 tablet 0  . ketoconazole (NIZORAL) 2 % cream Apply 1 application topically 2 (two) times daily.    . metoprolol (LOPRESSOR) 100 MG tablet Take 100 mg by mouth 2 (two) times daily.    . Multiple Vitamins-Minerals (CENTRUM SILVER PO) Take 1 capsule by mouth daily.    Marland Kitchen nystatin (MYCOSTATIN) 100000 UNIT/ML suspension  Take 5 mLs by mouth 2 (two) times daily.  0  . POTASSIUM CHLORIDE PO Take 20 mEq by mouth daily.    . pravastatin (PRAVACHOL) 40 MG tablet Take 40 mg by mouth daily.    . timolol (TIMOPTIC) 0.5 % ophthalmic solution Place 2 drops into the right eye daily.  1  . valsartan-hydrochlorothiazide (DIOVAN-HCT) 160-12.5 MG per tablet Take 1 tablet by mouth daily.    . vitamin C (ASCORBIC ACID) 500 MG  tablet Take 500 mg by mouth daily.     No current facility-administered medications on file prior to visit.    Past Surgical History  Procedure Laterality Date  . Cataract extraction Bilateral   . Corneal transplant Right   . Tonsillectomy    . Melanoma excision Left     left flank area 1980s  . Amputation finger / thumb Left     forefinger  . Bone marrow biopsy Left 07/19/14  . Bone marrow aspiration Left 07/19/14    Denies any headaches, dizziness, double vision, fevers, chills, night sweats, nausea, vomiting, diarrhea, constipation, chest pain, heart palpitations, shortness of breath, blood in stool, black tarry stool, urinary pain, urinary burning, urinary frequency, hematuria.   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 EXTREMITIES:less then 2 second capillary refill, no joint deformities, effusion, or inflammation, no skin discoloration, no cyanosis  NEURO: alert & oriented x 3 with fluent speech, no focal motor/sensory deficits, gait normal   LABORATORY DATA: CBC    Component Value  Date/Time   WBC 3.6* 07/19/2014 0820   RBC 4.11* 07/19/2014 0820   RBC 4.16* 08/09/2013 1542   HGB 13.3 07/19/2014 0820   HCT 37.8* 07/19/2014 0820   PLT 158 07/19/2014 0820   MCV 92.0 07/19/2014 0820   MCH 32.4 07/19/2014 0820   MCHC 35.2 07/19/2014 0820   RDW 12.8 07/19/2014 0820   LYMPHSABS 1.7 07/19/2014 0820   MONOABS 0.3 07/19/2014 0820   EOSABS 0.2 07/19/2014 0820   BASOSABS 0.0 07/19/2014 0820      Chemistry      Component Value Date/Time   NA 137 06/22/2014 1236   K 3.3* 06/22/2014 1236   CL 99* 06/22/2014 1236   CO2 30 06/22/2014 1236   BUN 18 06/22/2014 1236   CREATININE 1.23 06/22/2014 1236      Component Value Date/Time   CALCIUM 9.3 06/22/2014 1236   ALKPHOS 56 06/22/2014 1236   AST 28 06/22/2014 1236   ALT 23 06/22/2014 1236   BILITOT 0.9 06/22/2014 1236     Lab Results  Component Value Date   PROT 8.6* 06/22/2014   ALBUMINELP 4.1 06/22/2014   A1GS 0.3 06/22/2014   A2GS 0.6 06/22/2014   BETS 0.9 06/22/2014  GAMS 2.2* 06/22/2014   MSPIKE 1.8* 06/22/2014   SPEI Comment 06/22/2014   SPECOM Comment 06/22/2014   IGGSERUM 2436* 06/22/2014   IGA 60* 06/22/2014   IGMSERUM 29 06/22/2014   KPAFRELGTCHN 17.71 06/22/2014   LAMBDASER 12.68 06/22/2014   KAPLAMBRATIO 1.40 06/22/2014    PENDING LABS:   RADIOGRAPHIC STUDIES:  Nm Pet Image Initial (pi) Whole Body  08/14/2014   CLINICAL DATA:  Subsequent treatment strategy for restaging of myeloma. History of cutaneous melanoma 20 years ago. Diabetes. Hypertension.  EXAM: NUCLEAR MEDICINE PET WHOLE BODY  TECHNIQUE: 9.3 mCi F-18 FDG was injected intravenously. Full-ring PET imaging was performed from the vertex to the feet after the radiotracer. CT data was obtained and used for attenuation correction and anatomic localization.  FASTING BLOOD GLUCOSE:  Value:  133 mg/dl  COMPARISON:  Skeletal survey 04/23/2014.  No prior PET.  FINDINGS: Head/Neck: No areas of abnormal hypermetabolism.  Chest: Subcarinal node  which measures 11 mm (upper normal) and a S.U.V. max of 3.7 on image 97 of series 4. Similar to the surrounding mediastinal blood pool.  Abdomen/Pelvis: Bilateral hypermetabolic inguinal nodes. Index left inguinal node measures 9 mm and a S.U.V. max of 2.2 on image 217.  A right inguinal node measures 9 mm and a S.U.V. max of 2.4 on image 223.  Skeleton: Left acromioclavicular joint hypermetabolism which is likely degenerative. Hypermetabolism lateral to the left iliac crest. This measures a S.U.V. max of 4.6, including on image 189. No well-defined soft tissue mass in this area. Question slight increased density within the adjacent gluteal musculature.  Equivocal mildly increased hypermetabolism about the more medial left iliac bone. No well-defined lytic lesion. This measures a S.U.V. max of 3.1, including on approximately image 189.  Extremities: No abnormal osseous or soft tissue hypermetabolism.  CT IMAGES PERFORMED FOR ATTENUATION: Bilateral carotid atherosclerosis. No cervical adenopathy.  Mild cardiomegaly with LAD coronary artery atherosclerosis. Calcifications or surgical clips in the left axilla. Trace bilateral pleural fluid or thickening. 4 mm left lower lobe pulmonary nodule on image 31. Mild right adrenal thickening. Normal left adrenal gland. Low-density interpolar right renal lesion which is likely a cyst. Abdominal aortic and branch vessel atherosclerosis. Mild prostatomegaly. Small bilateral hydroceles.  IMPRESSION: 1. No convincing evidence of hypermetabolic active myeloma. 2. Hypermetabolism within the musculature lateral left iliac crest. Suggestion of increased density within the musculature but no well-defined mass. Question prior bone marrow biopsy. If there has been no prior biopsy, this area is indeterminate and should be considered for pre and post-contrast pelvic MRI. 3. Left iliac hypermetabolism is without CT correlate and favored to be within normal variation. 4. Inguinal and  subcarinal nodes which are favored to be reactive. 5. A 5 mm left lower lobe pulmonary nodule is nonspecific. Given the history of melanoma, consider chest CT followup at 6 months. 6.  Atherosclerosis, including within the coronary arteries.   Electronically Signed   By: Abigail Miyamoto M.D.   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  PET imaging did demonstrate a small 5 mm LLL pulmonary nodule requiring follow-up with CT in 6 months due to history of melanoma.  It is nonspecific.  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  Patient education is provided regarding  smoldering myeloma.  Labs every 3 months: CBC diff, CMET, ESR, CRP, MM panel, B2M.  Return in 3 months for follow-up.  THERAPY PLAN:  Will monitor labs every 3 months.  We will be on the look-out for CRAB findings.   C= Calcium increase of greater than 11.5 mg/dL R = Renal insufficiency with creatinine > 2 mg/dL A= Anemia with Hgb < 10 g/dL B= Bone disease with lytic lesions and/or osteopenia   All questions were answered. The patient knows to call the clinic with any problems, questions or concerns. We can certainly see the patient much sooner if necessary.  Patient and plan discussed with Dr. Ancil Linsey and she is in agreement with the aforementioned.   This note is electronically signed by: Doy Mince 08/16/2014 10:50 AM

## 2014-08-16 NOTE — Patient Instructions (Signed)
[place] at [facility] Discharge Instructions  RECOMMENDATIONS MADE BY THE CONSULTANT AND ANY TEST RESULTS WILL BE SENT TO YOUR REFERRING PHYSICIAN.  Exam and discussion today with Kirby Crigler, PA-C. Return for lab work and office visit in 3 months as scheduled. CT scan of chest in 6 months as scheduled.    Thank you for choosing [place] at [facility] to provide your oncology and hematology care.  To afford each patient quality time with our provider, please arrive at least 15 minutes before your scheduled appointment time.    You need to re-schedule your appointment should you arrive 10 or more minutes late.  We strive to give you quality time with our providers, and arriving late affects you and other patients whose appointments are after yours.  Also, if you no show three or more times for appointments you may be dismissed from the clinic at the providers discretion.     Again, thank you for choosing [facility].  Our hope is that these requests will decrease the amount of time that you wait before being seen by our physicians.       _____________________________________________________________  Should you have questions after your visit to [facility], please contact our office at [phone] between the hours of 8:30 a.m. and 4:30 p.m.  Voicemails left after 4:30 p.m. will not be returned until the following business day.  For prescription refill requests, have your pharmacy contact our office.

## 2014-10-19 DIAGNOSIS — I10 Essential (primary) hypertension: Secondary | ICD-10-CM | POA: Diagnosis not present

## 2014-10-19 DIAGNOSIS — E785 Hyperlipidemia, unspecified: Secondary | ICD-10-CM | POA: Diagnosis not present

## 2014-10-19 DIAGNOSIS — H409 Unspecified glaucoma: Secondary | ICD-10-CM | POA: Diagnosis not present

## 2014-10-19 DIAGNOSIS — E1129 Type 2 diabetes mellitus with other diabetic kidney complication: Secondary | ICD-10-CM | POA: Diagnosis not present

## 2014-10-19 DIAGNOSIS — E1169 Type 2 diabetes mellitus with other specified complication: Secondary | ICD-10-CM | POA: Diagnosis not present

## 2014-10-25 DIAGNOSIS — E1129 Type 2 diabetes mellitus with other diabetic kidney complication: Secondary | ICD-10-CM | POA: Diagnosis not present

## 2014-10-25 DIAGNOSIS — I129 Hypertensive chronic kidney disease with stage 1 through stage 4 chronic kidney disease, or unspecified chronic kidney disease: Secondary | ICD-10-CM | POA: Diagnosis not present

## 2014-10-25 DIAGNOSIS — D472 Monoclonal gammopathy: Secondary | ICD-10-CM | POA: Diagnosis not present

## 2014-10-25 DIAGNOSIS — I10 Essential (primary) hypertension: Secondary | ICD-10-CM | POA: Diagnosis not present

## 2014-10-25 DIAGNOSIS — Z23 Encounter for immunization: Secondary | ICD-10-CM | POA: Diagnosis not present

## 2014-11-16 ENCOUNTER — Encounter (HOSPITAL_BASED_OUTPATIENT_CLINIC_OR_DEPARTMENT_OTHER): Payer: Medicare Other

## 2014-11-16 ENCOUNTER — Encounter (HOSPITAL_COMMUNITY): Payer: Medicare Other | Attending: Oncology | Admitting: Oncology

## 2014-11-16 VITALS — BP 172/60 | HR 58 | Temp 97.8°F | Resp 20 | Wt 179.4 lb

## 2014-11-16 DIAGNOSIS — C9000 Multiple myeloma not having achieved remission: Secondary | ICD-10-CM | POA: Diagnosis not present

## 2014-11-16 DIAGNOSIS — D61818 Other pancytopenia: Secondary | ICD-10-CM | POA: Diagnosis not present

## 2014-11-16 DIAGNOSIS — D472 Monoclonal gammopathy: Secondary | ICD-10-CM

## 2014-11-16 DIAGNOSIS — D72819 Decreased white blood cell count, unspecified: Secondary | ICD-10-CM | POA: Diagnosis not present

## 2014-11-16 DIAGNOSIS — D649 Anemia, unspecified: Secondary | ICD-10-CM | POA: Insufficient documentation

## 2014-11-16 LAB — COMPREHENSIVE METABOLIC PANEL
ALK PHOS: 61 U/L (ref 38–126)
ALT: 22 U/L (ref 17–63)
AST: 26 U/L (ref 15–41)
Albumin: 4.5 g/dL (ref 3.5–5.0)
Anion gap: 8 (ref 5–15)
BUN: 20 mg/dL (ref 6–20)
CALCIUM: 9.3 mg/dL (ref 8.9–10.3)
CO2: 29 mmol/L (ref 22–32)
CREATININE: 1.16 mg/dL (ref 0.61–1.24)
Chloride: 99 mmol/L — ABNORMAL LOW (ref 101–111)
GFR calc non Af Amer: 57 mL/min — ABNORMAL LOW (ref >60)
Glucose, Bld: 216 mg/dL — ABNORMAL HIGH (ref 65–99)
Potassium: 3.8 mmol/L (ref 3.5–5.1)
SODIUM: 136 mmol/L (ref 135–145)
TOTAL PROTEIN: 8.7 g/dL — AB (ref 6.5–8.1)
Total Bilirubin: 1 mg/dL (ref 0.3–1.2)

## 2014-11-16 LAB — CBC WITH DIFFERENTIAL/PLATELET
Basophils Absolute: 0 10*3/uL (ref 0.0–0.1)
Basophils Relative: 1 %
EOS ABS: 0.1 10*3/uL (ref 0.0–0.7)
Eosinophils Relative: 3 %
HCT: 38 % — ABNORMAL LOW (ref 39.0–52.0)
HEMOGLOBIN: 13.3 g/dL (ref 13.0–17.0)
LYMPHS PCT: 38 %
Lymphs Abs: 1.6 10*3/uL (ref 0.7–4.0)
MCH: 32.4 pg (ref 26.0–34.0)
MCHC: 35 g/dL (ref 30.0–36.0)
MCV: 92.7 fL (ref 78.0–100.0)
MONOS PCT: 6 %
Monocytes Absolute: 0.3 10*3/uL (ref 0.1–1.0)
NEUTROS PCT: 52 %
Neutro Abs: 2.2 10*3/uL (ref 1.7–7.7)
Platelets: 161 10*3/uL (ref 150–400)
RBC: 4.1 MIL/uL — AB (ref 4.22–5.81)
RDW: 12.9 % (ref 11.5–15.5)
WBC: 4.2 10*3/uL (ref 4.0–10.5)

## 2014-11-16 LAB — SEDIMENTATION RATE: SED RATE: 26 mm/h — AB (ref 0–16)

## 2014-11-16 LAB — LACTATE DEHYDROGENASE: LDH: 161 U/L (ref 98–192)

## 2014-11-16 LAB — C-REACTIVE PROTEIN: CRP: <0.5 mg/dL (ref <1.0)

## 2014-11-16 NOTE — Patient Instructions (Signed)
[place] at [facility] Discharge Instructions  RECOMMENDATIONS MADE BY THE CONSULTANT AND ANY TEST RESULTS WILL BE SENT TO YOUR REFERRING PHYSICIAN.  Exam per Meriel Flavors. Return 3 months lab and MD appointment.  Thank you for choosing [place] at [facility] to provide your oncology and hematology care.  To afford each patient quality time with our provider, please arrive at least 15 minutes before your scheduled appointment time.    You need to re-schedule your appointment should you arrive 10 or more minutes late.  We strive to give you quality time with our providers, and arriving late affects you and other patients whose appointments are after yours.  Also, if you no show three or more times for appointments you may be dismissed from the clinic at the providers discretion.     Again, thank you for choosing [facility].  Our hope is that these requests will decrease the amount of time that you wait before being seen by our physicians.       _____________________________________________________________  Should you have questions after your visit to [facility], please contact our office at [phone] between the hours of 8:30 a.m. and 4:30 p.m.  Voicemails left after 4:30 p.m. will not be returned until the following business day.  For prescription refill requests, have your pharmacy contact our office.

## 2014-11-16 NOTE — Progress Notes (Signed)
LABS DRAWN

## 2014-11-16 NOTE — Progress Notes (Signed)
Maurice Bogus, MD [place] [id number]  Smoldering multiple myeloma (SMM) [facility])  CURRENT THERAPY: Observation for smoldering myeloma.  INTERVAL HISTORY: Maurice Cooper 81 y.o. male returns for followup of smoldering multiple myeloma with a bone marrow biopsy on 07/19/14 demonstrating a 10% plasma cell with normal cytogenetics, negative PET scan on 08/14/2014 for lytic lesions, and an IgG of ~2400.  I personally reviewed and went over radiographic studies with the patient.  The results are noted within this dictation.  He is scheduled for CT imaging in Jan 2017 to follow-up on a pulmonary nodule.  I personally reviewed and went over laboratory results with the patient.  The results are noted within this dictation.  These are updated today.  Most labs are pending at this time, but CBC is WNL.  I have provided patient education regarding smoldering myeloma.  He denies any new pain.  He denies any B symptoms.  His brother is a patient of ours and we see him for an anemia.  Past Medical History  Diagnosis Date  . Diabetes mellitus   . Hypertension   . Hypercholesteremia   . Melanoma     lower back  . Other pancytopenia 02/25/2014  . 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     has No Known Allergies.  Current Outpatient Prescriptions on File Prior to Visit  Medication Sig Dispense Refill  . AMLODIPINE BESYLATE PO Take 10 mg by mouth daily.    Marland Kitchen aspirin EC 325 MG tablet Take 325 mg by mouth daily.    Marland Kitchen DOXAZOSIN MESYLATE PO Take 4 mg by mouth daily.    . finasteride (PROSCAR) 5 MG tablet Take 5 mg by mouth daily.  0  . GLIPIZIDE ER PO Take 5 mg by mouth daily.    . metoprolol (LOPRESSOR) 100 MG tablet Take 100 mg by mouth 2 (two)  times daily.    . Multiple Vitamins-Minerals (CENTRUM SILVER PO) Take 1 capsule by mouth daily.    Marland Kitchen POTASSIUM CHLORIDE PO Take 20 mEq by mouth daily.    . pravastatin (PRAVACHOL) 40 MG tablet Take 40 mg by mouth daily.    . timolol (TIMOPTIC) 0.5 % ophthalmic solution Place 2 drops into the right eye daily.  1  . valsartan-hydrochlorothiazide (DIOVAN-HCT) 160-12.5 MG per tablet Take 1 tablet by mouth daily.    . vitamin C (ASCORBIC ACID) 500 MG tablet Take 500 mg by mouth daily.    . fluticasone (FLONASE) 50 MCG/ACT nasal spray 2 puffs in each nostril at bedtime. (Patient not taking: Reported on 07/19/2014) 16 g 2  . HYDROcodone-acetaminophen (NORCO/VICODIN) 5-325 MG per tablet Take 1/2 to 1 tablet as needed every 4 hours for pain (Patient not taking: Reported on 08/03/2014) 25 tablet 0  . ketoconazole (NIZORAL) 2 % cream Apply 1 application topically 2 (two) times daily.    Marland Kitchen nystatin (MYCOSTATIN) 100000 UNIT/ML suspension Take 5 mLs by mouth 2 (two) times daily.  0   No current facility-administered medications on file prior to visit.    Past Surgical History  Procedure Laterality Date  . Cataract extraction Bilateral   . Corneal transplant Right   . Tonsillectomy    . Melanoma excision Left     left flank area 1980s  . Amputation  finger / thumb Left     forefinger  . Bone marrow biopsy Left 07/19/14  . Bone marrow aspiration Left 07/19/14    Denies any headaches, dizziness, double vision, fevers, chills, night sweats, nausea, vomiting, diarrhea, constipation, chest pain, heart palpitations, shortness of breath, blood in stool, black tarry stool, urinary pain, urinary burning, urinary frequency, hematuria.   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 EXTREMITIES:less then 2 second capillary refill, no joint deformities, effusion, or inflammation, no skin discoloration, no cyanosis  NEURO: alert & oriented x 3 with fluent speech, no focal motor/sensory deficits, gait normal   LABORATORY DATA: CBC    Component Value Date/Time   WBC 4.2 11/16/2014 1247   RBC 4.10* 11/16/2014 1247   RBC 4.16* 08/09/2013 1542   HGB 13.3 11/16/2014 1247   HCT 38.0* 11/16/2014 1247   PLT 161 11/16/2014 1247   MCV 92.7 11/16/2014 1247   MCH 32.4 11/16/2014 1247   MCHC 35.0 11/16/2014 1247   RDW 12.9 11/16/2014 1247   LYMPHSABS 1.6 11/16/2014 1247   MONOABS 0.3 11/16/2014 1247   EOSABS 0.1 11/16/2014 1247   BASOSABS 0.0 11/16/2014 1247      Chemistry      Component Value Date/Time   NA 137 06/22/2014 1236   K 3.3* 06/22/2014 1236   CL 99* 06/22/2014 1236   CO2 30 06/22/2014 1236   BUN 18 06/22/2014 1236   CREATININE 1.23 06/22/2014 1236      Component Value Date/Time   CALCIUM 9.3 06/22/2014 1236   ALKPHOS 56 06/22/2014 1236   AST 28 06/22/2014 1236   ALT 23 06/22/2014 1236   BILITOT 0.9 06/22/2014 1236     Lab Results  Component Value Date   PROT 8.6* 06/22/2014   ALBUMINELP 4.1 06/22/2014   A1GS 0.3 06/22/2014   A2GS 0.6 06/22/2014   BETS 0.9 06/22/2014   GAMS 2.2*  06/22/2014   MSPIKE 1.8* 06/22/2014   SPEI Comment 06/22/2014   SPECOM Comment 06/22/2014   IGGSERUM 2436* 06/22/2014   IGA 60* 06/22/2014   IGMSERUM 29 06/22/2014   KPAFRELGTCHN 17.71 06/22/2014   LAMBDASER 12.68 06/22/2014   KAPLAMBRATIO 1.40 06/22/2014   KAPLAMBRATIO 7.06 06/22/2014    PENDING LABS:   RADIOGRAPHIC STUDIES:  No results found.   PATHOLOGY:    ASSESSMENT AND PLAN:   Smoldering multiple myeloma (SMM) Smoldering multiple myeloma with a bone marrow biopsy on 07/19/14 demonstrating a 10% plasma cell with normal cytogenetics, negative PET scan on 08/14/2014 for lytic lesions, and an IgG of ~2400.  PET imaging did demonstrate a small 5 mm LLL pulmonary nodule requiring follow-up with CT in 6 months due to history of melanoma.  It is nonspecific.  CT  of chest is scheduled for Jan 2017.  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  Smoldering multiple myeloma - Smoldering multiple myeloma (SMM) is defined as:  ?M-protein ?3 g/dL and/or 10 to 60 percent bone marrow plasma cells, plus  ?No end-organ damage or other myeloma-defining events, and no amyloidosis  Thus, for the diagnosis of SMM, patients should not have any of the following myeloma-defining events:  ?End-organ damage (lytic lesions, anemia, renal disease, or hypercalcemia) that can be attributed to the underlying plasma cell disorder  ??60 percent clonal plasma cells in the bone marrow   ?Involved/uninvolved free light chain (FLC) ratio of 100 or more   ?Magnetic resonance imaging (MRI) with more than one focal lesion (involving bone or bone marrow) Asymptomatic patients with one or more of the myeloma-defining events listed above are considered to have MM rather than SMM because they have a risk of progression with complications of greater than 80 percent within two years.  Labs are updated today and most are pending at this time.  We will call him with results.  Labs in 3 months: CBC diff, CMET,  ESR, CRP, MM panel, B2M.  Return in 3 months for follow-up and review of imaging test.   THERAPY PLAN:  Will monitor labs every 3 months.  We will be on the look-out for CRAB findings.   C= Calcium increase of greater than 11.5 mg/dL R = Renal insufficiency with creatinine > 2 mg/dL A= Anemia with Hgb < 10 g/dL B= Bone disease with lytic lesions and/or osteopenia   All questions were answered. The patient knows to call the clinic with any problems, questions or concerns. We can certainly see the patient much sooner if necessary.  Patient and plan discussed with Dr. Ancil Linsey and she is in agreement with the aforementioned.   This note is electronically signed by: Doy Mince 11/16/2014 1:33 PM

## 2014-11-16 NOTE — Assessment & Plan Note (Addendum)
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  PET imaging did demonstrate a small 5 mm LLL pulmonary nodule requiring follow-up with CT in 6 months due to history of melanoma.  It is nonspecific.  CT of chest is scheduled for Jan 2017.  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  Smoldering multiple myeloma - Smoldering multiple myeloma (SMM) is defined as:  ?M-protein ?3 g/dL and/or 10 to 60 percent bone marrow plasma cells, plus  ?No end-organ damage or other myeloma-defining events, and no amyloidosis  Thus, for the diagnosis of SMM, patients should not have any of the following myeloma-defining events:  ?End-organ damage (lytic lesions, anemia, renal disease, or hypercalcemia) that can be attributed to the underlying plasma cell disorder  ??60 percent clonal plasma cells in the bone marrow   ?Involved/uninvolved free light chain (FLC) ratio of 100 or more   ?Magnetic resonance imaging (MRI) with more than one focal lesion (involving bone or bone marrow) Asymptomatic patients with one or more of the myeloma-defining events listed above are considered to have MM rather than SMM because they have a risk of progression with complications of greater than 80 percent within two years.  Labs are updated today and most are pending at this time.  We will call him with results.  Labs in 3 months: CBC diff, CMET, ESR, CRP, MM panel, B2M.  Return in 3 months for follow-up and review of imaging test.

## 2014-11-17 LAB — BETA 2 MICROGLOBULIN, SERUM: BETA 2 MICROGLOBULIN: 2.2 mg/L (ref 0.6–2.4)

## 2014-11-18 LAB — KAPPA/LAMBDA LIGHT CHAINS
KAPPA FREE LGHT CHN: 18.9 mg/L (ref 3.30–19.40)
Kappa, lambda light chain ratio: 1.38 (ref 0.26–1.65)
Lambda free light chains: 13.72 mg/L (ref 5.71–26.30)

## 2014-11-19 LAB — MULTIPLE MYELOMA PANEL, SERUM
ALPHA 1: 0.2 g/dL (ref 0.0–0.4)
Albumin SerPl Elph-Mcnc: 4.1 g/dL (ref 2.9–4.4)
Albumin/Glob SerPl: 1.1 (ref 0.7–1.7)
Alpha2 Glob SerPl Elph-Mcnc: 0.7 g/dL (ref 0.4–1.0)
B-Globulin SerPl Elph-Mcnc: 0.9 g/dL (ref 0.7–1.3)
GLOBULIN, TOTAL: 4.1 g/dL — AB (ref 2.2–3.9)
Gamma Glob SerPl Elph-Mcnc: 2.3 g/dL — ABNORMAL HIGH (ref 0.4–1.8)
IGA: 74 mg/dL (ref 61–437)
IGG (IMMUNOGLOBIN G), SERUM: 2475 mg/dL — AB (ref 700–1600)
IGM, SERUM: 30 mg/dL (ref 15–143)
M Protein SerPl Elph-Mcnc: 1.9 g/dL — ABNORMAL HIGH
Total Protein ELP: 8.2 g/dL (ref 6.0–8.5)

## 2015-01-04 DIAGNOSIS — H402213 Chronic angle-closure glaucoma, right eye, severe stage: Secondary | ICD-10-CM | POA: Diagnosis not present

## 2015-01-04 DIAGNOSIS — H40002 Preglaucoma, unspecified, left eye: Secondary | ICD-10-CM | POA: Diagnosis not present

## 2015-01-04 DIAGNOSIS — H5441 Blindness, right eye, normal vision left eye: Secondary | ICD-10-CM | POA: Diagnosis not present

## 2015-01-21 DIAGNOSIS — H409 Unspecified glaucoma: Secondary | ICD-10-CM | POA: Diagnosis not present

## 2015-01-21 DIAGNOSIS — E1169 Type 2 diabetes mellitus with other specified complication: Secondary | ICD-10-CM | POA: Diagnosis not present

## 2015-01-21 DIAGNOSIS — E785 Hyperlipidemia, unspecified: Secondary | ICD-10-CM | POA: Diagnosis not present

## 2015-01-21 DIAGNOSIS — I10 Essential (primary) hypertension: Secondary | ICD-10-CM | POA: Diagnosis not present

## 2015-01-21 DIAGNOSIS — E1129 Type 2 diabetes mellitus with other diabetic kidney complication: Secondary | ICD-10-CM | POA: Diagnosis not present

## 2015-01-23 DIAGNOSIS — H402213 Chronic angle-closure glaucoma, right eye, severe stage: Secondary | ICD-10-CM | POA: Diagnosis not present

## 2015-01-23 DIAGNOSIS — H40002 Preglaucoma, unspecified, left eye: Secondary | ICD-10-CM | POA: Diagnosis not present

## 2015-01-23 DIAGNOSIS — Z961 Presence of intraocular lens: Secondary | ICD-10-CM | POA: Diagnosis not present

## 2015-01-24 DIAGNOSIS — I10 Essential (primary) hypertension: Secondary | ICD-10-CM | POA: Diagnosis not present

## 2015-01-24 DIAGNOSIS — E1165 Type 2 diabetes mellitus with hyperglycemia: Secondary | ICD-10-CM | POA: Diagnosis not present

## 2015-01-24 DIAGNOSIS — E1129 Type 2 diabetes mellitus with other diabetic kidney complication: Secondary | ICD-10-CM | POA: Diagnosis not present

## 2015-01-24 DIAGNOSIS — D472 Monoclonal gammopathy: Secondary | ICD-10-CM | POA: Diagnosis not present

## 2015-02-15 ENCOUNTER — Ambulatory Visit (HOSPITAL_COMMUNITY)
Admission: RE | Admit: 2015-02-15 | Discharge: 2015-02-15 | Disposition: A | Discharge status: Home / Self Care | Payer: Medicare Other | Source: Ambulatory Visit | Attending: Oncology | Admitting: Oncology

## 2015-02-15 DIAGNOSIS — I2510 Atherosclerotic heart disease of native coronary artery without angina pectoris: Secondary | ICD-10-CM | POA: Insufficient documentation

## 2015-02-15 DIAGNOSIS — R911 Solitary pulmonary nodule: Secondary | ICD-10-CM | POA: Diagnosis not present

## 2015-02-18 ENCOUNTER — Encounter (HOSPITAL_COMMUNITY): Payer: Self-pay | Admitting: Hematology & Oncology

## 2015-02-18 ENCOUNTER — Encounter (HOSPITAL_COMMUNITY): Payer: Medicare Other | Attending: Oncology | Admitting: Hematology & Oncology

## 2015-02-18 ENCOUNTER — Encounter (HOSPITAL_COMMUNITY): Payer: Medicare Other

## 2015-02-18 VITALS — BP 180/70 | HR 54 | Temp 97.6°F | Resp 18 | Wt 181.8 lb

## 2015-02-18 DIAGNOSIS — C9000 Multiple myeloma not having achieved remission: Secondary | ICD-10-CM | POA: Diagnosis not present

## 2015-02-18 DIAGNOSIS — N182 Chronic kidney disease, stage 2 (mild): Secondary | ICD-10-CM | POA: Diagnosis not present

## 2015-02-18 DIAGNOSIS — D61818 Other pancytopenia: Secondary | ICD-10-CM | POA: Insufficient documentation

## 2015-02-18 DIAGNOSIS — D72819 Decreased white blood cell count, unspecified: Secondary | ICD-10-CM | POA: Diagnosis not present

## 2015-02-18 DIAGNOSIS — D472 Monoclonal gammopathy: Secondary | ICD-10-CM

## 2015-02-18 DIAGNOSIS — D649 Anemia, unspecified: Secondary | ICD-10-CM | POA: Diagnosis not present

## 2015-02-18 DIAGNOSIS — R911 Solitary pulmonary nodule: Secondary | ICD-10-CM | POA: Diagnosis not present

## 2015-02-18 LAB — CBC WITH DIFFERENTIAL/PLATELET
BASOS ABS: 0 10*3/uL (ref 0.0–0.1)
BASOS PCT: 1 %
EOS ABS: 0.2 10*3/uL (ref 0.0–0.7)
Eosinophils Relative: 6 %
HEMATOCRIT: 38.5 % — AB (ref 39.0–52.0)
HEMOGLOBIN: 13.4 g/dL (ref 13.0–17.0)
Lymphocytes Relative: 39 %
Lymphs Abs: 1.4 10*3/uL (ref 0.7–4.0)
MCH: 32.2 pg (ref 26.0–34.0)
MCHC: 34.8 g/dL (ref 30.0–36.0)
MCV: 92.5 fL (ref 78.0–100.0)
MONO ABS: 0.3 10*3/uL (ref 0.1–1.0)
MONOS PCT: 8 %
NEUTROS ABS: 1.7 10*3/uL (ref 1.7–7.7)
NEUTROS PCT: 46 %
Platelets: 175 10*3/uL (ref 150–400)
RBC: 4.16 MIL/uL — ABNORMAL LOW (ref 4.22–5.81)
RDW: 12.8 % (ref 11.5–15.5)
WBC: 3.6 10*3/uL — ABNORMAL LOW (ref 4.0–10.5)

## 2015-02-18 LAB — COMPREHENSIVE METABOLIC PANEL
ALBUMIN: 4.4 g/dL (ref 3.5–5.0)
ALK PHOS: 61 U/L (ref 38–126)
ALT: 21 U/L (ref 17–63)
AST: 24 U/L (ref 15–41)
Anion gap: 5 (ref 5–15)
BUN: 18 mg/dL (ref 6–20)
CALCIUM: 9.3 mg/dL (ref 8.9–10.3)
CHLORIDE: 102 mmol/L (ref 101–111)
CO2: 32 mmol/L (ref 22–32)
CREATININE: 1.17 mg/dL (ref 0.61–1.24)
GFR, EST NON AFRICAN AMERICAN: 57 mL/min — AB (ref >60)
Glucose, Bld: 147 mg/dL — ABNORMAL HIGH (ref 65–99)
Potassium: 3.7 mmol/L (ref 3.5–5.1)
SODIUM: 139 mmol/L (ref 135–145)
TOTAL PROTEIN: 8.5 g/dL — AB (ref 6.5–8.1)
Total Bilirubin: 0.6 mg/dL (ref 0.3–1.2)

## 2015-02-18 LAB — SEDIMENTATION RATE: SED RATE: 20 mm/h — AB (ref 0–16)

## 2015-02-18 LAB — LACTATE DEHYDROGENASE: LDH: 157 U/L (ref 98–192)

## 2015-02-18 LAB — C-REACTIVE PROTEIN: CRP: <0.5 mg/dL (ref <1.0)

## 2015-02-18 NOTE — Patient Instructions (Addendum)
[place] at [facility] Discharge Instructions  RECOMMENDATIONS MADE BY THE CONSULTANT AND ANY TEST RESULTS WILL BE SENT TO YOUR REFERRING PHYSICIAN.   Exam completed by Dr Whitney Muse today Return to see the doctor in 4 months with labs Your Chest CT was stable, still 49mm we will do another CT in 6-12 months If you get another cold sore, we can call you in a prescription for something to help it faster Bone Survey before you return  Please call the clinic if you have any questions or concerns    Thank you for choosing [place] at [facility] to provide your oncology and hematology care.  To afford each patient quality time with our provider, please arrive at least 15 minutes before your scheduled appointment time.    You need to re-schedule your appointment should you arrive 10 or more minutes late.  We strive to give you quality time with our providers, and arriving late affects you and other patients whose appointments are after yours.  Also, if you no show three or more times for appointments you may be dismissed from the clinic at the providers discretion.     Again, thank you for choosing [facility].  Our hope is that these requests will decrease the amount of time that you wait before being seen by our physicians.       _____________________________________________________________  Should you have questions after your visit to [facility], please contact our office at [phone] between the hours of 8:30 a.m. and 4:30 p.m.  Voicemails left after 4:30 p.m. will not be returned until the following business day.  For prescription refill requests, have your pharmacy contact our office.

## 2015-02-18 NOTE — Progress Notes (Signed)
[name], MD [place] [address]  Pancytopenia CKD Stage II UPEP with Monoclonal IgG heavy chain with associated kappa light chain Bone Survey on 04/23/2014 with no findings to suggest primary of metastatic malignancy of the skeleton BMBX 07/19/2014 with hypercellular marrow for age with trilineage hematopoiesis, plasmacytosis, estimate of 10% plasma cells in the marrow that display kappa light chain restriction c/w plasma cell dyscrasia  CURRENT THERAPY: Observation  INTERVAL HISTORY: Maurice Cooper 82 y.o. male returns for followup of (per Dr. 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  He was seen by Rheumatology and further work-up was performed including SPEP and IFE that demonstrated an M-Spike. He has CKD.  [name] returns to the [place] alone today.  He says that his holidays were fine and that, overall, he's been feeling "pretty good." He says that his appetite and energy have both been fine or "pretty good."  In terms of major changes since the last time he came in, he says there have been none that he knows of. Realistically he seems to be doing well.  He reports that his family, including specifically his son, are all doing well. He says that about 53 of them got together to celebrate Christmas as a family. He says "there are eight of [place], and all of our families."  Overall, he has no complaints. He is here today to review CT of the chest and for ongoing follow-up of his myeloma.  Past Medical History  Diagnosis Date  . Diabetes mellitus ([place])   . Hypertension   . Hypercholesteremia   . Melanoma ([place])     lower back  . Other pancytopenia ([place]) 02/25/2014  . Smoldering multiple myeloma (SMM) ([place]) 02/25/2014    has Benign prostatic hypertrophy; Diabetes mellitus ([place]); Chronic kidney disease; Hypertension; Glaucoma associated with systemic syndrome;  and Smoldering multiple myeloma (SMM) ([facility]) on his problem list.     has No Known Allergies.  Mr. [name] does not currently have medications on file.  Past Surgical History  Procedure Laterality Date  . Cataract extraction Bilateral   . Corneal transplant Right   . Tonsillectomy    . Melanoma excision Left     left flank area 1980s  . Amputation finger / thumb Left     forefinger  . Bone marrow biopsy Left 07/19/14  . Bone marrow aspiration Left 07/19/14    Denies any headaches, dizziness, double vision, fevers, chills, night sweats, nausea, vomiting, diarrhea, constipation, chest pain, heart palpitations, shortness of breath, blood in stool, black tarry stool, urinary pain, urinary burning, urinary frequency, hematuria. 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 BACK: Back symmetric, no curvature. BMBX site is examined, well healed, barely noticeable. No pain with palpation of the  site EXTREMITIES:less then 2 second capillary refill, no joint deformities, effusion, or inflammation, no skin discoloration, no cyanosis  NEURO: alert & oriented x 3 with fluent speech, no focal motor/sensory deficits, gait normal   LABORATORY DATA: I have reviewed the data as listed  CBC    Component Value Date/Time   WBC 3.6* 02/18/2015 1132   RBC 4.16* 02/18/2015 1132   RBC 4.16* 08/09/2013 1542   HGB 13.4 02/18/2015 1132   HCT 38.5* 02/18/2015 1132   PLT 175 02/18/2015 1132   MCV 92.5 02/18/2015 1132   MCH 32.2 02/18/2015 1132   MCHC 34.8 02/18/2015 1132   RDW 12.8 02/18/2015 1132   LYMPHSABS 1.4 02/18/2015 1132   MONOABS 0.3 02/18/2015 1132   EOSABS 0.2 02/18/2015 1132   BASOSABS 0.0 02/18/2015 1132      Chemistry      Component Value Date/Time   NA 139 02/18/2015 1132   K 3.7 02/18/2015 1132   CL 102 02/18/2015 1132   CO2 32 02/18/2015 1132   BUN 18 02/18/2015 1132   CREATININE 1.17 02/18/2015 1132      Component Value Date/Time   CALCIUM 9.3 02/18/2015 1132   ALKPHOS 61 02/18/2015 1132   AST 24 02/18/2015 1132   ALT 21 02/18/2015 1132   BILITOT 0.6 02/18/2015 1132     RADIOLOGY   Study Result     CLINICAL DATA: Follow-up left lower lobe pulmonary nodule. History of melanoma and multiple myeloma.  EXAM: CT CHEST WITHOUT CONTRAST  TECHNIQUE: Multidetector CT imaging of the chest was performed following the standard protocol without IV contrast.  COMPARISON: 08/14/2014 PET-CT.  FINDINGS: Mediastinum/Nodes: Normal heart size. No pericardial fluid/thickening. Coronary atherosclerosis. Great vessels are normal in course and caliber. Normal visualized thyroid. Normal esophagus. Re- demonstrated are surgical clips in the left axilla. No axillary lymphadenopathy. Mildly enlarged 1.2 cm subcarinal lymph node, previously 1.2 cm on 08/14/2014 using similar measurement technique, unchanged. No additional pathologically enlarged mediastinal  or gross hilar nodes. Scattered granulomatous calcifications an nonenlarged bilateral hilar nodes, unchanged.  Lungs/Pleura: No pneumothorax. No pleural effusion. Stable mild pleural-parenchymal scarring at both lung apices. Stable 5 mm solid left lower lobe pulmonary nodule (series 3/image 30). No acute consolidative airspace disease, new significant pulmonary nodules or lung masses.  Upper abdomen: Unremarkable.  Musculoskeletal: No aggressive appearing focal osseous lesions. Mild-to-moderate degenerative changes in the thoracic spine.  IMPRESSION: 1. Interval stability of solitary 5 mm left lower lobe pulmonary nodule, for which 6 month stability has been demonstrated. No new pulmonary nodules. A follow-up chest CT is recommended in 6-12 months. 2. Mild subcarinal lymphadenopathy, for which 6 month stability has been demonstrated, suggesting benign etiology. This can also be reassessed on a follow-up chest CT in 6-12 months . 3. Coronary atherosclerosis.   Electronically Signed  By: Ilona Sorrel M.D.  On: 02/15/2015 12:58     PATHOLOGY:  FINAL DIAGNOSIS Diagnosis Bone Marrow, Aspirate,Biopsy, and Clot - HYPERCELLULAR BONE MARROW FOR AGE WITH TRILINEAGE HEMATOPOIESIS. - PLASMACYTOSIS - SEE COMMENT. PERIPHERAL BLOOD: - NEUTROPENIA. Diagnosis Note The bone marrow material is generally limited with lack of adequate aspirate or optimal touch imprints for evaluation. The core biopsy is hypercellular for age with trilineage hematopoiesis. Immunohistochemical stains  highlight an increased plasma cell component in the marrow which is estimated to average10% of all cells in the core biopsy. The plasma cells display kappa light chain excess/restriction consistent with plasma cell dyscrasia/neoplasm. Correlation with cytogenetic studies is recommended. (BNS:ecj/gt 07/20/2014) Susanne Greenhouse MD Pathologist, Electronic Signature (Case signed 07/23/2014)   NORMAL  CYTOGENETICS   ASSESSMENT AND PLAN:  SMOLDERING MYELOMA, 10% PLASMA CELLS ON BMBX Pancytopenia Positive ANA rheumatologic evaluation negative Chronic kidney disease, stage II PET/CT on 08/14/2014 with no convincing evidence of hypermetabolic active myeloma. 19m LLL pulmonary nodule  THERAPY PLAN:   I reviewed his repeat imaging, findings are stable. I have recommended another CT of the chest in 6 to 12 months.  His bloodwork has been very stable. We will move his visits out to 4 months. He will need another myeloma survey prior to his next follow-up.   We will notify the patient of any significant changes in his bloodwork once available.   Orders Placed This Encounter  Procedures  . DG Bone Survey Met    Standing Status: Future     Number of Occurrences:      Standing Expiration Date: 04/20/2016    Order Specific Question:  Reason for Exam (SYMPTOM  OR DIAGNOSIS REQUIRED)    Answer:  smoldering myeloma    Order Specific Question:  [facility]    [facility]  This note was electronically signed.  This document serves as a record of services personally performed by SAncil Linsey MD. It was created on her behalf by KToni Amend a trained medical scribe. The creation of this record is based on the scribe's personal observations and the provider's statements to them. This document has been checked and approved by the attending provider.  I have reviewed the above documentation for accuracy and completeness, and I agree with the above.  SKelby Fam PWhitney Muse MD

## 2015-02-19 LAB — MULTIPLE MYELOMA PANEL, SERUM
ALBUMIN/GLOB SERPL: 1.1 (ref 0.7–1.7)
ALPHA 1: 0.3 g/dL (ref 0.0–0.4)
ALPHA2 GLOB SERPL ELPH-MCNC: 0.7 g/dL (ref 0.4–1.0)
Albumin SerPl Elph-Mcnc: 4.1 g/dL (ref 2.9–4.4)
B-GLOBULIN SERPL ELPH-MCNC: 0.9 g/dL (ref 0.7–1.3)
GAMMA GLOB SERPL ELPH-MCNC: 2.1 g/dL — AB (ref 0.4–1.8)
GLOBULIN, TOTAL: 4 g/dL — AB (ref 2.2–3.9)
IGG (IMMUNOGLOBIN G), SERUM: 2409 mg/dL — AB (ref 700–1600)
IGM, SERUM: 29 mg/dL (ref 15–143)
IgA: 73 mg/dL (ref 61–437)
M Protein SerPl Elph-Mcnc: 1.6 g/dL — ABNORMAL HIGH
Total Protein ELP: 8.1 g/dL (ref 6.0–8.5)

## 2015-02-19 LAB — KAPPA/LAMBDA LIGHT CHAINS
KAPPA FREE LGHT CHN: 18.05 mg/L (ref 3.30–19.40)
Kappa, lambda light chain ratio: 1.32 (ref 0.26–1.65)
LAMDA FREE LIGHT CHAINS: 13.68 mg/L (ref 5.71–26.30)

## 2015-02-19 LAB — BETA 2 MICROGLOBULIN, SERUM: BETA 2 MICROGLOBULIN: 2.2 mg/L (ref 0.6–2.4)

## 2015-02-25 ENCOUNTER — Other Ambulatory Visit (HOSPITAL_COMMUNITY): Payer: Self-pay | Admitting: Emergency Medicine

## 2015-02-25 ENCOUNTER — Telehealth (HOSPITAL_COMMUNITY): Payer: Self-pay | Admitting: Oncology

## 2015-03-01 ENCOUNTER — Telehealth (HOSPITAL_COMMUNITY): Payer: Self-pay | Admitting: *Deleted

## 2015-03-06 MED ORDER — ACYCLOVIR 5 % EX OINT: 1.0000 "application " | TOPICAL_OINTMENT | CUTANEOUS | Status: DC

## 2015-03-14 DIAGNOSIS — C4402 Squamous cell carcinoma of skin of lip: Secondary | ICD-10-CM | POA: Diagnosis not present

## 2015-03-14 DIAGNOSIS — Z08 Encounter for follow-up examination after completed treatment for malignant neoplasm: Secondary | ICD-10-CM | POA: Diagnosis not present

## 2015-03-14 DIAGNOSIS — Z85828 Personal history of other malignant neoplasm of skin: Secondary | ICD-10-CM | POA: Diagnosis not present

## 2015-03-21 DIAGNOSIS — Z1283 Encounter for screening for malignant neoplasm of skin: Secondary | ICD-10-CM | POA: Diagnosis not present

## 2015-03-21 DIAGNOSIS — Z85820 Personal history of malignant melanoma of skin: Secondary | ICD-10-CM | POA: Diagnosis not present

## 2015-03-21 DIAGNOSIS — C4402 Squamous cell carcinoma of skin of lip: Secondary | ICD-10-CM | POA: Diagnosis not present

## 2015-03-21 DIAGNOSIS — L570 Actinic keratosis: Secondary | ICD-10-CM | POA: Diagnosis not present

## 2015-03-21 DIAGNOSIS — Z08 Encounter for follow-up examination after completed treatment for malignant neoplasm: Secondary | ICD-10-CM | POA: Diagnosis not present

## 2015-03-21 DIAGNOSIS — B078 Other viral warts: Secondary | ICD-10-CM | POA: Diagnosis not present

## 2015-04-24 DIAGNOSIS — I10 Essential (primary) hypertension: Secondary | ICD-10-CM | POA: Diagnosis not present

## 2015-04-24 DIAGNOSIS — E1165 Type 2 diabetes mellitus with hyperglycemia: Secondary | ICD-10-CM | POA: Diagnosis not present

## 2015-04-24 DIAGNOSIS — D472 Monoclonal gammopathy: Secondary | ICD-10-CM | POA: Diagnosis not present

## 2015-04-24 DIAGNOSIS — E1129 Type 2 diabetes mellitus with other diabetic kidney complication: Secondary | ICD-10-CM | POA: Diagnosis not present

## 2015-04-25 DIAGNOSIS — C4402 Squamous cell carcinoma of skin of lip: Secondary | ICD-10-CM | POA: Diagnosis not present

## 2015-04-29 DIAGNOSIS — Z1211 Encounter for screening for malignant neoplasm of colon: Secondary | ICD-10-CM | POA: Diagnosis not present

## 2015-04-29 DIAGNOSIS — Z0000 Encounter for general adult medical examination without abnormal findings: Secondary | ICD-10-CM | POA: Diagnosis not present

## 2015-06-18 ENCOUNTER — Encounter (HOSPITAL_COMMUNITY): Payer: Self-pay | Admitting: Hematology & Oncology

## 2015-06-18 ENCOUNTER — Encounter (HOSPITAL_COMMUNITY): Payer: Medicare Other | Attending: Hematology & Oncology | Admitting: Hematology & Oncology

## 2015-06-18 ENCOUNTER — Other Ambulatory Visit (HOSPITAL_COMMUNITY): Payer: Self-pay | Admitting: Oncology

## 2015-06-18 ENCOUNTER — Ambulatory Visit (HOSPITAL_COMMUNITY)
Admission: RE | Admit: 2015-06-18 | Discharge: 2015-06-18 | Disposition: A | Discharge status: Home / Self Care | Payer: Medicare Other | Source: Ambulatory Visit | Attending: Hematology & Oncology | Admitting: Hematology & Oncology

## 2015-06-18 ENCOUNTER — Encounter (HOSPITAL_COMMUNITY): Payer: Medicare Other

## 2015-06-18 VITALS — BP 187/64 | HR 56 | Temp 97.8°F | Resp 20 | Wt 179.0 lb

## 2015-06-18 DIAGNOSIS — N189 Chronic kidney disease, unspecified: Secondary | ICD-10-CM

## 2015-06-18 DIAGNOSIS — C9000 Multiple myeloma not having achieved remission: Secondary | ICD-10-CM

## 2015-06-18 DIAGNOSIS — D61818 Other pancytopenia: Secondary | ICD-10-CM | POA: Insufficient documentation

## 2015-06-18 DIAGNOSIS — Z85820 Personal history of malignant melanoma of skin: Secondary | ICD-10-CM | POA: Insufficient documentation

## 2015-06-18 DIAGNOSIS — H409 Unspecified glaucoma: Secondary | ICD-10-CM | POA: Diagnosis not present

## 2015-06-18 DIAGNOSIS — Z9889 Other specified postprocedural states: Secondary | ICD-10-CM | POA: Insufficient documentation

## 2015-06-18 DIAGNOSIS — N182 Chronic kidney disease, stage 2 (mild): Secondary | ICD-10-CM | POA: Insufficient documentation

## 2015-06-18 DIAGNOSIS — E7800 Pure hypercholesterolemia, unspecified: Secondary | ICD-10-CM | POA: Insufficient documentation

## 2015-06-18 DIAGNOSIS — D472 Monoclonal gammopathy: Secondary | ICD-10-CM

## 2015-06-18 DIAGNOSIS — E876 Hypokalemia: Secondary | ICD-10-CM

## 2015-06-18 DIAGNOSIS — N400 Enlarged prostate without lower urinary tract symptoms: Secondary | ICD-10-CM | POA: Diagnosis not present

## 2015-06-18 DIAGNOSIS — E1122 Type 2 diabetes mellitus with diabetic chronic kidney disease: Secondary | ICD-10-CM | POA: Diagnosis not present

## 2015-06-18 DIAGNOSIS — R911 Solitary pulmonary nodule: Secondary | ICD-10-CM

## 2015-06-18 LAB — COMPREHENSIVE METABOLIC PANEL
ALBUMIN: 4.5 g/dL (ref 3.5–5.0)
ALT: 24 U/L (ref 17–63)
ANION GAP: 7 (ref 5–15)
AST: 29 U/L (ref 15–41)
Alkaline Phosphatase: 60 U/L (ref 38–126)
BUN: 22 mg/dL — AB (ref 6–20)
CHLORIDE: 100 mmol/L — AB (ref 101–111)
CO2: 30 mmol/L (ref 22–32)
Calcium: 9.2 mg/dL (ref 8.9–10.3)
Creatinine, Ser: 1.24 mg/dL (ref 0.61–1.24)
GFR calc Af Amer: >60 mL/min (ref >60)
GFR, EST NON AFRICAN AMERICAN: 53 mL/min — AB (ref >60)
Glucose, Bld: 135 mg/dL — ABNORMAL HIGH (ref 65–99)
POTASSIUM: 3.4 mmol/L — AB (ref 3.5–5.1)
Sodium: 137 mmol/L (ref 135–145)
Total Bilirubin: 0.5 mg/dL (ref 0.3–1.2)
Total Protein: 8.6 g/dL — ABNORMAL HIGH (ref 6.5–8.1)

## 2015-06-18 LAB — CBC WITH DIFFERENTIAL/PLATELET
BASOS ABS: 0 10*3/uL (ref 0.0–0.1)
BASOS PCT: 0 %
EOS PCT: 5 %
Eosinophils Absolute: 0.2 10*3/uL (ref 0.0–0.7)
HCT: 35.4 % — ABNORMAL LOW (ref 39.0–52.0)
Hemoglobin: 12.3 g/dL — ABNORMAL LOW (ref 13.0–17.0)
Lymphocytes Relative: 38 %
Lymphs Abs: 1.3 10*3/uL (ref 0.7–4.0)
MCH: 32.3 pg (ref 26.0–34.0)
MCHC: 34.7 g/dL (ref 30.0–36.0)
MCV: 92.9 fL (ref 78.0–100.0)
MONOS PCT: 8 %
Monocytes Absolute: 0.3 10*3/uL (ref 0.1–1.0)
Neutro Abs: 1.7 10*3/uL (ref 1.7–7.7)
Neutrophils Relative %: 49 %
PLATELETS: 161 10*3/uL (ref 150–400)
RBC: 3.81 MIL/uL — ABNORMAL LOW (ref 4.22–5.81)
RDW: 12.9 % (ref 11.5–15.5)
WBC: 3.5 10*3/uL — ABNORMAL LOW (ref 4.0–10.5)

## 2015-06-18 LAB — SEDIMENTATION RATE: SED RATE: 22 mm/h — AB (ref 0–16)

## 2015-06-18 LAB — C-REACTIVE PROTEIN: CRP: <0.5 mg/dL (ref <1.0)

## 2015-06-18 LAB — LACTATE DEHYDROGENASE: LDH: 150 U/L (ref 98–192)

## 2015-06-18 MED ORDER — POTASSIUM CHLORIDE CRYS ER 20 MEQ PO TBCR: 40.0000 meq | EXTENDED_RELEASE_TABLET | Freq: Every day | ORAL | Status: DC

## 2015-06-18 NOTE — Patient Instructions (Addendum)
[place] at [facility] Discharge Instructions  RECOMMENDATIONS MADE BY THE CONSULTANT AND ANY TEST RESULTS WILL BE SENT TO YOUR REFERRING PHYSICIAN.  Return 4 months too see Tom and for labs  Go to Radiology Department today for a bone survey.   Thank you for choosing [place] at [facility] to provide your oncology and hematology care.  To afford each patient quality time with our provider, please arrive at least 15 minutes before your scheduled appointment time.   Beginning January 23rd 2017 lab work for the [facility] will be done in the  Main lab at [facility] on 1st floor. If you have a lab appointment with the [facility] please come in thru the  Main Entrance and check in at the main information desk  You need to re-schedule your appointment should you arrive 10 or more minutes late.  We strive to give you quality time with our providers, and arriving late affects you and other patients whose appointments are after yours.  Also, if you no show three or more times for appointments you may be dismissed from the clinic at the providers discretion.     Again, thank you for choosing [facility].  Our hope is that these requests will decrease the amount of time that you wait before being seen by our physicians.       _____________________________________________________________  Should you have questions after your visit to [facility], please contact our office at [phone] between the hours of 8:30 a.m. and 4:30 p.m.  Voicemails left after 4:30 p.m. will not be returned until the following business day.  For prescription refill requests, have your pharmacy contact our office.         Resources For Cancer Patients and their Caregivers ? American Cancer Society: Can assist with transportation, wigs, general needs, runs Look Good Feel Better.        503-233-0598 ? Cancer Care: Provides financial  assistance, online support groups, medication/co-pay assistance.  1-800-813-HOPE (812)433-4840) ? [place] Assists [place] cancer patients and their families through emotional , educational and financial support.  605-631-8383 ? [place] DSS Where to apply for food stamps, Medicaid and utility assistance. (630)290-9757 ? [facility]: Transportation to medical appointments. 639-231-0366 ? Social Security Administration: May apply for disability if have a Stage IV cancer. 385-684-8422 336-092-2757 ? [employer], Disability and Transit Services: Assists with nutrition, care and transit needs. [place] Support Programs: @10RELATIVEDAYS @ > Cancer Support Group  2nd Tuesday of the month 1pm-2pm, Journey Room  > Creative Journey  3rd Tuesday of the month 1130am-1pm, Journey Room  > Look Good Feel Better  1st Wednesday of the month 10am-12 noon, Journey Room (Call [facility] [place] to register [phone])

## 2015-06-18 NOTE — Progress Notes (Signed)
Maurice Cooper at [place], MD [address] [place] [address]  Pancytopenia CKD Stage II UPEP with Monoclonal IgG heavy chain with associated kappa light chain Bone Survey on 04/23/2014 with no findings to suggest primary of metastatic malignancy of the skeleton BMBX 07/19/2014 with hypercellular marrow for age with trilineage hematopoiesis, plasmacytosis, estimate of 10% plasma cells in the marrow that display kappa light chain restriction c/w plasma cell dyscrasia  CURRENT THERAPY: Observation  INTERVAL HISTORY: Maurice Cooper 82 y.o. male returns for followup of (per Dr. 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  He was seen by Rheumatology and further work-up was performed including SPEP and IFE that demonstrated an M-Spike. He has CKD. 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  Maurice Cooper returns to the [facility] today unaccompanied. He says everything is okay. When asked if he's staying busy, he says "not too busy."  He notes that his appetite has been fine. His blood pressure is high today but he notes "it's always high when I come here; it's never this way at the doctor's office."  His potassium is low today but he notes that he does take supplemental potassium, and that he's been taking it regularly. Toward the end of his appointment, he notes that he's been splitting his pills in half and not taking them as prescribed.  Other than this, he notes no new problems or nothing else out of the ordinary.  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 History of corneal transplant.   Past Medical History  Diagnosis Date  . Diabetes mellitus  ([place])   . Hypertension   . Hypercholesteremia   . Melanoma ([facility])     lower back  . Other pancytopenia ([facility]) 02/25/2014  . 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     has No Known Allergies.  Maurice Cooper does not currently have medications on file.  Past Surgical History  Procedure Laterality Date  . Cataract extraction Bilateral   . Corneal transplant Right   . Tonsillectomy    . Melanoma excision Left     left flank area 1980s  . Amputation finger / thumb Left     forefinger  . Bone marrow biopsy Left 07/19/14  . Bone marrow aspiration Left 07/19/14    Denies any headaches, dizziness, double vision, fevers, chills, night sweats, nausea, vomiting, diarrhea, constipation, chest pain, heart palpitations, shortness of breath, blood in stool, black tarry stool, urinary pain, urinary burning, urinary frequency, hematuria.  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 BACK: Back symmetric, no curvature.  EXTREMITIES:less then 2 second capillary refill, no joint deformities, effusion, or inflammation, no skin discoloration, no cyanosis  NEURO: alert & oriented x 3 with fluent speech, no focal motor/sensory deficits, gait normal   LABORATORY DATA: I have reviewed the data as listed  CBC    Component Value Date/Time   WBC 3.5* 06/18/2015 0859   RBC 3.81* 06/18/2015 0859   RBC 4.16* 08/09/2013 1542   HGB 12.3* 06/18/2015 0859   HCT 35.4* 06/18/2015 0859   PLT 161 06/18/2015 0859   MCV 92.9 06/18/2015 0859   MCH 32.3 06/18/2015 0859   MCHC 34.7 06/18/2015 0859   RDW 12.9 06/18/2015 0859   LYMPHSABS 1.3 06/18/2015 0859   MONOABS 0.3 06/18/2015 0859   EOSABS 0.2 06/18/2015 0859   BASOSABS 0.0 06/18/2015 0859      Chemistry      Component Value Date/Time   NA 137 06/18/2015 0859   K 3.4* 06/18/2015 0859   CL 100* 06/18/2015 0859   CO2 30 06/18/2015 0859   BUN 22* 06/18/2015 0859   CREATININE 1.24 06/18/2015 0859      Component Value Date/Time   CALCIUM 9.2 06/18/2015 0859   ALKPHOS 60 06/18/2015 0859   AST 29 06/18/2015 0859   ALT 24 06/18/2015 0859   BILITOT 0.5 06/18/2015 0859       RADIOLOGY   Study Result     CLINICAL DATA: Follow-up left lower lobe pulmonary nodule. History of melanoma and multiple myeloma.  EXAM: CT CHEST WITHOUT CONTRAST  TECHNIQUE: Multidetector CT imaging of the chest was performed following the standard protocol without IV contrast.  COMPARISON: 08/14/2014 PET-CT.  FINDINGS: Mediastinum/Nodes: Normal heart size. No pericardial fluid/thickening. Coronary atherosclerosis. Great vessels are normal in course and caliber. Normal visualized thyroid. Normal esophagus. Re- demonstrated are surgical clips in the left axilla. No axillary lymphadenopathy. Mildly  enlarged 1.2 cm subcarinal lymph node, previously 1.2 cm on 08/14/2014 using similar measurement technique, unchanged. No additional pathologically enlarged mediastinal or gross hilar nodes. Scattered granulomatous calcifications an nonenlarged bilateral hilar nodes, unchanged.  Lungs/Pleura: No pneumothorax. No pleural effusion. Stable mild pleural-parenchymal scarring at both lung apices. Stable 5 mm solid left lower lobe pulmonary nodule (series 3/image 30). No acute consolidative airspace disease, new significant pulmonary nodules or lung masses.  Upper abdomen: Unremarkable.  Musculoskeletal: No aggressive appearing focal osseous lesions. Mild-to-moderate degenerative changes in the thoracic spine.  IMPRESSION: 1. Interval stability of solitary 5 mm left lower lobe pulmonary nodule, for which 6 month stability has been demonstrated. No new pulmonary nodules. A follow-up chest CT is recommended in 6-12 months. 2. Mild subcarinal lymphadenopathy, for which 6 month stability has been demonstrated, suggesting benign etiology. This can also be reassessed on a follow-up chest CT in 6-12 months . 3. Coronary atherosclerosis.   Electronically Signed  By: Ilona Sorrel M.D.  On: 02/15/2015 12:58     PATHOLOGY:  FINAL DIAGNOSIS Diagnosis Bone Marrow, Aspirate,Biopsy, and Clot - HYPERCELLULAR BONE MARROW FOR AGE WITH TRILINEAGE HEMATOPOIESIS. - PLASMACYTOSIS - SEE COMMENT. PERIPHERAL BLOOD: -  NEUTROPENIA. Diagnosis Note The bone marrow material is generally limited with lack of adequate aspirate or optimal touch imprints for evaluation. The core biopsy is hypercellular for age with trilineage hematopoiesis. Immunohistochemical stains highlight an increased plasma cell component in the marrow which is estimated to average10% of all cells in the core biopsy. The plasma cells display kappa light chain excess/restriction consistent with plasma cell dyscrasia/neoplasm.  Correlation with cytogenetic studies is recommended. (BNS:ecj/gt 07/20/2014) Susanne Greenhouse MD Pathologist, Electronic Signature (Case signed 07/23/2014)   NORMAL CYTOGENETICS   ASSESSMENT AND PLAN:  SMOLDERING MYELOMA, 10% PLASMA CELLS ON BMBX Pancytopenia Positive ANA rheumatologic evaluation negative Chronic kidney disease, stage II PET/CT on 08/14/2014 with no convincing evidence of hypermetabolic active myeloma. 38m LLL pulmonary nodule last CT 02/15/2015 Hypokalemia  THERAPY PLAN:   He is due for repeat myeloma survey. This has been ordered.  Slightly more anemic today. Will continue to monitor closely with follow-up in 4 months. He will be advised if any labs come back of concern.  He was instructed to take his potassium as prescribed.  Would repeat CT of the chest next January.   This note was electronically signed.  This document serves as a record of services personally performed by SAncil Linsey MD. It was created on her behalf by KToni Amend a trained medical scribe. The creation of this record is based on the scribe's personal observations and the provider's statements to them. This document has been checked and approved by the attending provider.  I have reviewed the above documentation for accuracy and completeness, and I agree with the above.  SKelby Fam PWhitney Muse MD

## 2015-06-19 LAB — KAPPA/LAMBDA LIGHT CHAINS
KAPPA, LAMDA LIGHT CHAIN RATIO: 1.5 (ref 0.26–1.65)
Kappa free light chain: 20.24 mg/L — ABNORMAL HIGH (ref 3.30–19.40)
Lambda free light chains: 13.5 mg/L (ref 5.71–26.30)

## 2015-06-19 LAB — BETA 2 MICROGLOBULIN, SERUM: Beta-2 Microglobulin: 2.2 mg/L (ref 0.6–2.4)

## 2015-06-20 LAB — MULTIPLE MYELOMA PANEL, SERUM
ALBUMIN SERPL ELPH-MCNC: 4 g/dL (ref 2.9–4.4)
ALPHA 1: 0.2 g/dL (ref 0.0–0.4)
Albumin/Glob SerPl: 1.1 (ref 0.7–1.7)
Alpha2 Glob SerPl Elph-Mcnc: 0.7 g/dL (ref 0.4–1.0)
B-Globulin SerPl Elph-Mcnc: 0.9 g/dL (ref 0.7–1.3)
GAMMA GLOB SERPL ELPH-MCNC: 2.2 g/dL — AB (ref 0.4–1.8)
Globulin, Total: 4 g/dL — ABNORMAL HIGH (ref 2.2–3.9)
IGA: 72 mg/dL (ref 61–437)
IGM, SERUM: 29 mg/dL (ref 15–143)
IgG (Immunoglobin G), Serum: 2371 mg/dL — ABNORMAL HIGH (ref 700–1600)
M Protein SerPl Elph-Mcnc: 1.8 g/dL — ABNORMAL HIGH
Total Protein ELP: 8 g/dL (ref 6.0–8.5)

## 2015-07-22 DIAGNOSIS — H409 Unspecified glaucoma: Secondary | ICD-10-CM | POA: Diagnosis not present

## 2015-07-22 DIAGNOSIS — E1129 Type 2 diabetes mellitus with other diabetic kidney complication: Secondary | ICD-10-CM | POA: Diagnosis not present

## 2015-07-22 DIAGNOSIS — E1169 Type 2 diabetes mellitus with other specified complication: Secondary | ICD-10-CM | POA: Diagnosis not present

## 2015-07-22 DIAGNOSIS — I10 Essential (primary) hypertension: Secondary | ICD-10-CM | POA: Diagnosis not present

## 2015-07-22 DIAGNOSIS — E785 Hyperlipidemia, unspecified: Secondary | ICD-10-CM | POA: Diagnosis not present

## 2015-07-26 DIAGNOSIS — E1129 Type 2 diabetes mellitus with other diabetic kidney complication: Secondary | ICD-10-CM | POA: Diagnosis not present

## 2015-07-26 DIAGNOSIS — I129 Hypertensive chronic kidney disease with stage 1 through stage 4 chronic kidney disease, or unspecified chronic kidney disease: Secondary | ICD-10-CM | POA: Diagnosis not present

## 2015-07-26 DIAGNOSIS — D472 Monoclonal gammopathy: Secondary | ICD-10-CM | POA: Diagnosis not present

## 2015-07-26 DIAGNOSIS — L989 Disorder of the skin and subcutaneous tissue, unspecified: Secondary | ICD-10-CM | POA: Diagnosis not present

## 2015-08-07 DIAGNOSIS — H548 Legal blindness, as defined in USA: Secondary | ICD-10-CM | POA: Diagnosis not present

## 2015-08-07 DIAGNOSIS — Z961 Presence of intraocular lens: Secondary | ICD-10-CM | POA: Diagnosis not present

## 2015-08-07 DIAGNOSIS — H402213 Chronic angle-closure glaucoma, right eye, severe stage: Secondary | ICD-10-CM | POA: Diagnosis not present

## 2015-08-07 DIAGNOSIS — H40002 Preglaucoma, unspecified, left eye: Secondary | ICD-10-CM | POA: Diagnosis not present

## 2015-08-15 DIAGNOSIS — Z08 Encounter for follow-up examination after completed treatment for malignant neoplasm: Secondary | ICD-10-CM | POA: Diagnosis not present

## 2015-08-15 DIAGNOSIS — Z85828 Personal history of other malignant neoplasm of skin: Secondary | ICD-10-CM | POA: Diagnosis not present

## 2015-08-15 DIAGNOSIS — X32XXXD Exposure to sunlight, subsequent encounter: Secondary | ICD-10-CM | POA: Diagnosis not present

## 2015-08-15 DIAGNOSIS — Z85820 Personal history of malignant melanoma of skin: Secondary | ICD-10-CM | POA: Diagnosis not present

## 2015-08-15 DIAGNOSIS — L570 Actinic keratosis: Secondary | ICD-10-CM | POA: Diagnosis not present

## 2015-10-21 ENCOUNTER — Other Ambulatory Visit (HOSPITAL_COMMUNITY)
Admission: RE | Admit: 2015-10-21 | Discharge: 2015-10-21 | Disposition: A | Discharge status: Home / Self Care | Payer: Medicare Other | Source: Ambulatory Visit | Attending: Pulmonary Disease | Admitting: Pulmonary Disease

## 2015-10-21 ENCOUNTER — Other Ambulatory Visit (HOSPITAL_COMMUNITY): Payer: Medicare Other

## 2015-10-21 ENCOUNTER — Encounter (HOSPITAL_COMMUNITY): Payer: Medicare Other | Attending: Oncology

## 2015-10-21 ENCOUNTER — Ambulatory Visit (HOSPITAL_COMMUNITY): Payer: Medicare Other | Admitting: Oncology

## 2015-10-21 DIAGNOSIS — L989 Disorder of the skin and subcutaneous tissue, unspecified: Secondary | ICD-10-CM | POA: Diagnosis not present

## 2015-10-21 DIAGNOSIS — D472 Monoclonal gammopathy: Secondary | ICD-10-CM

## 2015-10-21 DIAGNOSIS — R911 Solitary pulmonary nodule: Secondary | ICD-10-CM | POA: Insufficient documentation

## 2015-10-21 DIAGNOSIS — Z87891 Personal history of nicotine dependence: Secondary | ICD-10-CM | POA: Diagnosis not present

## 2015-10-21 DIAGNOSIS — D649 Anemia, unspecified: Secondary | ICD-10-CM | POA: Diagnosis not present

## 2015-10-21 DIAGNOSIS — E1129 Type 2 diabetes mellitus with other diabetic kidney complication: Secondary | ICD-10-CM | POA: Insufficient documentation

## 2015-10-21 DIAGNOSIS — C9000 Multiple myeloma not having achieved remission: Secondary | ICD-10-CM

## 2015-10-21 LAB — COMPREHENSIVE METABOLIC PANEL
ALK PHOS: 60 U/L (ref 38–126)
ALT: 22 U/L (ref 17–63)
AST: 23 U/L (ref 15–41)
Albumin: 4.6 g/dL (ref 3.5–5.0)
Anion gap: 11 (ref 5–15)
BUN: 19 mg/dL (ref 6–20)
CALCIUM: 9.4 mg/dL (ref 8.9–10.3)
CO2: 29 mmol/L (ref 22–32)
CREATININE: 1.16 mg/dL (ref 0.61–1.24)
Chloride: 98 mmol/L — ABNORMAL LOW (ref 101–111)
GFR, EST NON AFRICAN AMERICAN: 57 mL/min — AB (ref >60)
Glucose, Bld: 159 mg/dL — ABNORMAL HIGH (ref 65–99)
Potassium: 3.6 mmol/L (ref 3.5–5.1)
Sodium: 138 mmol/L (ref 135–145)
Total Bilirubin: 0.7 mg/dL (ref 0.3–1.2)
Total Protein: 8.7 g/dL — ABNORMAL HIGH (ref 6.5–8.1)

## 2015-10-21 LAB — BASIC METABOLIC PANEL
Anion gap: 8 (ref 5–15)
BUN: 19 mg/dL (ref 6–20)
CALCIUM: 9.4 mg/dL (ref 8.9–10.3)
CO2: 29 mmol/L (ref 22–32)
CREATININE: 1.17 mg/dL (ref 0.61–1.24)
Chloride: 100 mmol/L — ABNORMAL LOW (ref 101–111)
GFR calc Af Amer: >60 mL/min (ref >60)
GFR calc non Af Amer: 56 mL/min — ABNORMAL LOW (ref >60)
GLUCOSE: 158 mg/dL — AB (ref 65–99)
Potassium: 3.6 mmol/L (ref 3.5–5.1)
Sodium: 137 mmol/L (ref 135–145)

## 2015-10-21 LAB — CBC WITH DIFFERENTIAL/PLATELET
Basophils Absolute: 0 10*3/uL (ref 0.0–0.1)
Basophils Relative: 1 %
EOS PCT: 4 %
Eosinophils Absolute: 0.2 10*3/uL (ref 0.0–0.7)
HCT: 37.4 % — ABNORMAL LOW (ref 39.0–52.0)
HEMOGLOBIN: 12.6 g/dL — AB (ref 13.0–17.0)
LYMPHS ABS: 1.4 10*3/uL (ref 0.7–4.0)
LYMPHS PCT: 35 %
MCH: 31.1 pg (ref 26.0–34.0)
MCHC: 33.7 g/dL (ref 30.0–36.0)
MCV: 92.3 fL (ref 78.0–100.0)
MONOS PCT: 8 %
Monocytes Absolute: 0.3 10*3/uL (ref 0.1–1.0)
Neutro Abs: 2.1 10*3/uL (ref 1.7–7.7)
Neutrophils Relative %: 52 %
PLATELETS: 201 10*3/uL (ref 150–400)
RBC: 4.05 MIL/uL — AB (ref 4.22–5.81)
RDW: 13 % (ref 11.5–15.5)
WBC: 4.1 10*3/uL (ref 4.0–10.5)

## 2015-10-21 LAB — C-REACTIVE PROTEIN: CRP: <0.5 mg/dL (ref <1.0)

## 2015-10-21 LAB — LACTATE DEHYDROGENASE: LDH: 140 U/L (ref 98–192)

## 2015-10-21 LAB — SEDIMENTATION RATE: SED RATE: 35 mm/h — AB (ref 0–16)

## 2015-10-22 LAB — HEMOGLOBIN A1C
Hgb A1c MFr Bld: 6.6 % — ABNORMAL HIGH (ref 4.8–5.6)
MEAN PLASMA GLUCOSE: 143 mg/dL

## 2015-10-22 LAB — KAPPA/LAMBDA LIGHT CHAINS
KAPPA FREE LGHT CHN: 17.7 mg/L (ref 3.3–19.4)
Kappa, lambda light chain ratio: 1.28 (ref 0.26–1.65)
LAMDA FREE LIGHT CHAINS: 13.8 mg/L (ref 5.7–26.3)

## 2015-10-22 LAB — BETA 2 MICROGLOBULIN, SERUM: BETA 2 MICROGLOBULIN: 2.4 mg/L (ref 0.6–2.4)

## 2015-10-23 ENCOUNTER — Encounter (HOSPITAL_COMMUNITY): Payer: Self-pay | Admitting: Oncology

## 2015-10-23 ENCOUNTER — Encounter (HOSPITAL_BASED_OUTPATIENT_CLINIC_OR_DEPARTMENT_OTHER): Payer: Medicare Other | Admitting: Oncology

## 2015-10-23 ENCOUNTER — Other Ambulatory Visit (HOSPITAL_COMMUNITY): Payer: Medicare Other

## 2015-10-23 VITALS — BP 173/76 | HR 56 | Temp 98.0°F | Resp 16 | Ht 69.0 in | Wt 176.0 lb

## 2015-10-23 DIAGNOSIS — D472 Monoclonal gammopathy: Secondary | ICD-10-CM

## 2015-10-23 DIAGNOSIS — C9000 Multiple myeloma not having achieved remission: Secondary | ICD-10-CM | POA: Diagnosis not present

## 2015-10-23 DIAGNOSIS — R911 Solitary pulmonary nodule: Secondary | ICD-10-CM | POA: Diagnosis not present

## 2015-10-23 DIAGNOSIS — D649 Anemia, unspecified: Secondary | ICD-10-CM

## 2015-10-23 NOTE — Patient Instructions (Signed)
[place] at [facility] Discharge Instructions  RECOMMENDATIONS MADE BY THE CONSULTANT AND ANY TEST RESULTS WILL BE SENT TO YOUR REFERRING PHYSICIAN.  You were seen today by Kirby Crigler PA-C. Get Ct of the chest done in Jan 2018. Return in 4 months for labs and to see Dr. Whitney Muse.   Thank you for choosing [place] at [facility] to provide your oncology and hematology care.  To afford each patient quality time with our provider, please arrive at least 15 minutes before your scheduled appointment time.   Beginning January 23rd 2017 lab work for the [facility] will be done in the  Main lab at [facility] on 1st floor. If you have a lab appointment with the [facility] please come in thru the  Main Entrance and check in at the main information desk  You need to re-schedule your appointment should you arrive 10 or more minutes late.  We strive to give you quality time with our providers, and arriving late affects you and other patients whose appointments are after yours.  Also, if you no show three or more times for appointments you may be dismissed from the clinic at the providers discretion.     Again, thank you for choosing [facility].  Our hope is that these requests will decrease the amount of time that you wait before being seen by our physicians.       _____________________________________________________________  Should you have questions after your visit to [facility], please contact our office at [phone] between the hours of 8:30 a.m. and 4:30 p.m.  Voicemails left after 4:30 p.m. will not be returned until the following business day.  For prescription refill requests, have your pharmacy contact our office.         Resources For Cancer Patients and their Caregivers ? American Cancer Society: Can assist with transportation, wigs, general needs, runs Look Good Feel Better.         913-756-8491 ? Cancer Care: Provides financial assistance, online support groups, medication/co-pay assistance.  1-800-813-HOPE (838) 158-3680) ? [facility] Assists [place] cancer patients and their families through emotional , educational and financial support.  804-432-7068 ? [place] DSS Where to apply for food stamps, Medicaid and utility assistance. 914-344-2115 ? [facility]: Transportation to medical appointments. (432)729-0861 ? Social Security Administration: May apply for disability if have a Stage IV cancer. 360-575-2649 (618)192-6512 ? [employer], Disability and Transit Services: Assists with nutrition, care and transit needs. [place] Support Programs: @10RELATIVEDAYS @ > Cancer Support Group  2nd Tuesday of the month 1pm-2pm, Journey Room  > Creative Journey  3rd Tuesday of the month 1130am-1pm, Journey Room  > Look Good Feel Better  1st Wednesday of the month 10am-12 noon, Journey Room (Call [place] to register [phone])

## 2015-10-23 NOTE — Progress Notes (Signed)
Maurice Bogus, MD [address] [place] [address]  Smoldering multiple myeloma (SMM) [facility]) - Plan: CBC with Differential, Comprehensive metabolic panel, Lactate dehydrogenase, Sedimentation rate, Vitamin B12, Folate, Iron and TIBC, Ferritin, Kappa/lambda light chains, Beta 2 microglobuline, serum, IgG, IgA, IgM, Immunofixation electrophoresis, Protein electrophoresis, serum, C-reactive protein  Anemia, unspecified anemia type - Plan: CBC with Differential, Vitamin B12, Folate, Iron and TIBC, Ferritin  Pulmonary nodule - Plan: CT Chest Wo Contrast  CURRENT THERAPY: Observation  INTERVAL HISTORY: Maurice Cooper 82 y.o. male returns for followup of smoldering multiple myeloma with a bone marrow biopsy on 07/19/14 demonstrating a 10% plasma cell with normal cytogenetics, negative PET scan on 08/14/2014 for lytic lesions, and an IgG of ~2400.  He reports that he is doing very well. He denies any complaints.  He denies any new pain.  He denies any urinary complaints.  He denies any issues with blood loss.  No changes in his energy level.   He denies any B symptoms.  Weight is stable.  Review of Systems  Constitutional: Negative.  Negative for chills, fever and weight loss.  HENT: Negative.   Eyes: Negative.   Respiratory: Negative.  Negative for cough.   Cardiovascular: Negative.  Negative for chest pain.  Gastrointestinal: Negative.  Negative for abdominal pain, constipation, diarrhea, nausea and vomiting.  Genitourinary: Negative.  Negative for dysuria.  Musculoskeletal: Negative.  Negative for back pain, falls, joint pain, myalgias and neck pain.  Skin: Negative.   Neurological: Negative.  Negative for weakness.  Endo/Heme/Allergies: Negative.  Does not bruise/bleed easily.  Psychiatric/Behavioral: Negative.     Past Medical History:  Diagnosis Date  . Diabetes mellitus ([place])   . Hypercholesteremia   . Hypertension   . Melanoma ([place])    lower back    . Other pancytopenia ([place]) 02/25/2014  . 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 AMPUTATION FINGER / THUMB Left    forefinger  . BONE MARROW ASPIRATION Left 07/19/14  . BONE MARROW BIOPSY Left 07/19/14  . CATARACT EXTRACTION Bilateral   . CORNEAL TRANSPLANT Right   . MELANOMA EXCISION Left    left flank area 1980s  . TONSILLECTOMY      Family History  Problem Relation Age of Onset  . Cancer Mother   . Stroke Father     Social History   Social History  . Marital status: Widowed    Spouse name: N/A  . Number of children: N/A  . Years of education: N/A   Social History Main Topics  . Smoking status: Former Smoker    Types: Cigarettes  . Smokeless tobacco: Former Systems developer    Types: Chew  . Alcohol use No  . Drug use: No  . Sexual activity: Not Asked   Other Topics Concern  . None   Social History Narrative  . None     PHYSICAL EXAMINATION  ECOG PERFORMANCE STATUS: 0 - Asymptomatic  Vitals:   10/23/15 1128  BP: (!) 173/76  Pulse: (!) 56  Resp: 16  Temp: 98 F (36.7 C)    GENERAL:alert, no distress, well nourished, well developed, comfortable, cooperative, smiling and unaccompanied. 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 EXTREMITIES:less then 2 second capillary refill, no joint deformities, effusion, or inflammation, no skin discoloration, no cyanosis  NEURO: alert & oriented x 3 with fluent speech, no focal motor/sensory deficits, gait normal   LABORATORY DATA: CBC    Component Value Date/Time   WBC 4.1 10/21/2015 1012   RBC 4.05 (L) 10/21/2015 1012   HGB 12.6 (L) 10/21/2015 1012   HCT 37.4 (L) 10/21/2015 1012   PLT 201 10/21/2015 1012   MCV 92.3 10/21/2015 1012   MCH 31.1 10/21/2015 1012   MCHC 33.7 10/21/2015 1012   RDW 13.0 10/21/2015 1012   LYMPHSABS 1.4 10/21/2015 1012   MONOABS 0.3 10/21/2015 1012   EOSABS 0.2 10/21/2015 1012   BASOSABS 0.0 10/21/2015 1012      Chemistry      Component Value Date/Time   NA 138 10/21/2015 1012   NA 137 10/21/2015 1012   K 3.6 10/21/2015 1012   K 3.6 10/21/2015 1012   CL 98 (L) 10/21/2015 1012   CL 100 (L) 10/21/2015 1012   CO2 29 10/21/2015 1012   CO2 29 10/21/2015 1012   BUN 19 10/21/2015 1012   BUN 19 10/21/2015 1012   CREATININE 1.16 10/21/2015 1012   CREATININE 1.17 10/21/2015 1012      Component Value Date/Time   CALCIUM 9.4 10/21/2015 1012   CALCIUM 9.4 10/21/2015 1012   ALKPHOS 60 10/21/2015 1012   AST 23 10/21/2015 1012   ALT 22 10/21/2015 1012   BILITOT 0.7 10/21/2015 1012        PENDING LABS:   RADIOGRAPHIC STUDIES:  No results found.   PATHOLOGY:    ASSESSMENT AND PLAN:  Smoldering multiple myeloma (SMM) Smoldering multiple myeloma with a bone marrow biopsy on 07/19/14 demonstrating a 10% plasma cell with normal cytogenetics, negative PET scan on 08/14/2014 for lytic lesions, and an IgG of ~2400.  Labs performed on 10/21/2015: CBC diff, CMET, LDH, ESR, CRP, B2M, serum light chain assay, and multiple myeloma panel.  I personally reviewed and went over laboratory results with the patient.  The results are noted within this dictation.  Minimal anemia is noted and stable compared to 4 months ago.  Will continue  to monitor closely.  Renal function and calcium are WNL.  I personally reviewed and went over radiographic studies with the patient.  The results are noted within this dictation.  Myeloma survey is negative for any new lytic lesions.  CT of chest to follow-up on 5 mm LLL pulmonary nodule is ordered, given history of melanoma.  This is to be completed in Jan 2018  He has smoldering multiple myeloma by definition.  Smoldering multiple myeloma - Smoldering multiple myeloma (SMM) is defined as:  ?M-protein ?3 g/dL and/or 10 to 60 percent bone marrow plasma cells, plus  ?No end-organ damage or other myeloma-defining events, and no amyloidosis  Thus, for the diagnosis of SMM, patients should not have any of the following myeloma-defining events:  ?End-organ damage (lytic lesions, anemia, renal disease, or hypercalcemia) that can be attributed to the underlying plasma cell disorder  ??60 percent clonal plasma cells in the bone marrow   ?Involved/uninvolved free light  chain (FLC) ratio of 100 or more   ?Magnetic resonance imaging (MRI) with more than one focal lesion (involving bone or bone marrow) Asymptomatic patients with one or more of the myeloma-defining events listed above are considered to have MM rather than SMM because they have a risk of progression with complications of greater than 80 percent within two years.  Labs in 4 months: CBC diff, CMET, ESR, CRP, MM panel, B2M.  Return in 4 months for follow-up and review of imaging test.   ORDERS PLACED FOR THIS ENCOUNTER: Orders Placed This Encounter  Procedures  . CT Chest Wo Contrast  . CBC with Differential  . Comprehensive metabolic panel  . Lactate dehydrogenase  . Sedimentation rate  . Vitamin B12  . Folate  . Iron and TIBC  . Ferritin  . Kappa/lambda light chains  . Beta 2 microglobuline, serum  . IgG, IgA, IgM  . Immunofixation electrophoresis  . Protein electrophoresis, serum  . C-reactive protein    MEDICATIONS  PRESCRIBED THIS ENCOUNTER: Meds ordered this encounter  Medications  . amLODipine (NORVASC) 10 MG tablet  . doxazosin (CARDURA) 4 MG tablet  . glipiZIDE (GLUCOTROL XL) 5 MG 24 hr tablet  . DISCONTD: potassium chloride SA (K-DUR,KLOR-CON) 20 MEQ tablet  . DISCONTD: potassium chloride SA (K-DUR,KLOR-CON) 20 MEQ tablet  . DISCONTD: potassium chloride SA (K-DUR,KLOR-CON) 20 MEQ tablet  . DISCONTD: potassium chloride SA (K-DUR,KLOR-CON) 20 MEQ tablet    Refill:  1    THERAPY PLAN:  Ongoing surveillance.   C= Calcium increase of greater than 11.5 mg/dL R = Renal insufficiency with creatinine > 2 mg/dL A= Anemia with Hgb < 10 g/dL B= Bone disease with lytic lesions and/or osteopenia   All questions were answered. The patient knows to call the clinic with any problems, questions or concerns. We can certainly see the patient much sooner if necessary.  Patient and plan discussed with Dr. Ancil Linsey and she is in agreement with the aforementioned.   This note is electronically signed by: Doy Mince 10/23/2015 5:32 PM

## 2015-10-23 NOTE — Assessment & Plan Note (Signed)
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  Labs performed on 10/21/2015: CBC diff, CMET, LDH, ESR, CRP, B2M, serum light chain assay, and multiple myeloma panel.  I personally reviewed and went over laboratory results with the patient.  The results are noted within this dictation.  Minimal anemia is noted and stable compared to 4 months ago.  Will continue to monitor closely.  Renal function and calcium are WNL.  I personally reviewed and went over radiographic studies with the patient.  The results are noted within this dictation.  Myeloma survey is negative for any new lytic lesions.  CT of chest to follow-up on 5 mm LLL pulmonary nodule is ordered, given history of melanoma.  This is to be completed in Jan 2018  He has smoldering multiple myeloma by definition.  Smoldering multiple myeloma - Smoldering multiple myeloma (SMM) is defined as:  ?M-protein ?3 g/dL and/or 10 to 60 percent bone marrow plasma cells, plus  ?No end-organ damage or other myeloma-defining events, and no amyloidosis  Thus, for the diagnosis of SMM, patients should not have any of the following myeloma-defining events:  ?End-organ damage (lytic lesions, anemia, renal disease, or hypercalcemia) that can be attributed to the underlying plasma cell disorder  ??60 percent clonal plasma cells in the bone marrow   ?Involved/uninvolved free light chain (FLC) ratio of 100 or more   ?Magnetic resonance imaging (MRI) with more than one focal lesion (involving bone or bone marrow) Asymptomatic patients with one or more of the myeloma-defining events listed above are considered to have MM rather than SMM because they have a risk of progression with complications of greater than 80 percent within two years.  Labs in 4 months: CBC diff, CMET, ESR, CRP, MM panel, B2M.  Return in 4 months  for follow-up and review of imaging test.

## 2015-10-24 LAB — MULTIPLE MYELOMA PANEL, SERUM
Albumin SerPl Elph-Mcnc: 4.5 g/dL — ABNORMAL HIGH (ref 2.9–4.4)
Albumin/Glob SerPl: 1.2 (ref 0.7–1.7)
Alpha 1: 0.2 g/dL (ref 0.0–0.4)
Alpha2 Glob SerPl Elph-Mcnc: 0.7 g/dL (ref 0.4–1.0)
B-GLOBULIN SERPL ELPH-MCNC: 0.8 g/dL (ref 0.7–1.3)
GAMMA GLOB SERPL ELPH-MCNC: 2.2 g/dL — AB (ref 0.4–1.8)
GLOBULIN, TOTAL: 3.9 g/dL (ref 2.2–3.9)
IgA: 74 mg/dL (ref 61–437)
IgG (Immunoglobin G), Serum: 2477 mg/dL — ABNORMAL HIGH (ref 700–1600)
IgM, Serum: 33 mg/dL (ref 15–143)
M PROTEIN SERPL ELPH-MCNC: 1.8 g/dL — AB
TOTAL PROTEIN ELP: 8.4 g/dL (ref 6.0–8.5)

## 2015-10-24 MED ORDER — OCTREOTIDE ACETATE 30 MG IM KIT
PACK | INTRAMUSCULAR | Status: AC
  Filled 2015-10-24: qty 1

## 2015-10-25 ENCOUNTER — Telehealth (HOSPITAL_COMMUNITY): Payer: Self-pay | Admitting: *Deleted

## 2015-10-25 DIAGNOSIS — I10 Essential (primary) hypertension: Secondary | ICD-10-CM | POA: Diagnosis not present

## 2015-10-25 DIAGNOSIS — N401 Enlarged prostate with lower urinary tract symptoms: Secondary | ICD-10-CM | POA: Diagnosis not present

## 2015-10-25 DIAGNOSIS — E1129 Type 2 diabetes mellitus with other diabetic kidney complication: Secondary | ICD-10-CM | POA: Diagnosis not present

## 2015-10-25 DIAGNOSIS — D472 Monoclonal gammopathy: Secondary | ICD-10-CM | POA: Diagnosis not present

## 2015-10-25 DIAGNOSIS — Z23 Encounter for immunization: Secondary | ICD-10-CM | POA: Diagnosis not present

## 2015-10-25 NOTE — Telephone Encounter (Signed)
-----   Message from [facility], PA-C sent at 10/24/2015  6:05 PM EDT ----- Stable

## 2016-01-20 ENCOUNTER — Other Ambulatory Visit (HOSPITAL_COMMUNITY)
Admission: RE | Admit: 2016-01-20 | Discharge: 2016-01-20 | Disposition: A | Discharge status: Home / Self Care | Payer: Medicare Other | Source: Ambulatory Visit | Attending: Pulmonary Disease | Admitting: Pulmonary Disease

## 2016-01-20 DIAGNOSIS — I10 Essential (primary) hypertension: Secondary | ICD-10-CM | POA: Diagnosis present

## 2016-01-20 DIAGNOSIS — E785 Hyperlipidemia, unspecified: Secondary | ICD-10-CM | POA: Diagnosis present

## 2016-01-20 DIAGNOSIS — I129 Hypertensive chronic kidney disease with stage 1 through stage 4 chronic kidney disease, or unspecified chronic kidney disease: Secondary | ICD-10-CM | POA: Diagnosis present

## 2016-01-20 DIAGNOSIS — H409 Unspecified glaucoma: Secondary | ICD-10-CM | POA: Insufficient documentation

## 2016-01-20 DIAGNOSIS — N401 Enlarged prostate with lower urinary tract symptoms: Secondary | ICD-10-CM | POA: Diagnosis present

## 2016-01-20 LAB — BASIC METABOLIC PANEL
Anion gap: 6 (ref 5–15)
BUN: 19 mg/dL (ref 6–20)
CALCIUM: 9.2 mg/dL (ref 8.9–10.3)
CO2: 32 mmol/L (ref 22–32)
CREATININE: 1.2 mg/dL (ref 0.61–1.24)
Chloride: 100 mmol/L — ABNORMAL LOW (ref 101–111)
GFR calc Af Amer: >60 mL/min (ref >60)
GFR, EST NON AFRICAN AMERICAN: 54 mL/min — AB (ref >60)
GLUCOSE: 149 mg/dL — AB (ref 65–99)
POTASSIUM: 3.5 mmol/L (ref 3.5–5.1)
SODIUM: 138 mmol/L (ref 135–145)

## 2016-01-21 LAB — HEMOGLOBIN A1C
HEMOGLOBIN A1C: 7 % — AB (ref 4.8–5.6)
MEAN PLASMA GLUCOSE: 154 mg/dL

## 2016-01-24 IMAGING — PT NM PET TUM IMG INITIAL (PI) WHOLE BODY
1 of 8 series · 3 of 25 positions shown · non-contrast
Comparison: Skeletal survey 04/23/2014.  No prior PET.

CLINICAL DATA: Subsequent treatment strategy for restaging of
myeloma. History of cutaneous melanoma 20 years ago. Diabetes.
Hypertension.

EXAM:
NUCLEAR MEDICINE PET WHOLE BODY
TECHNIQUE: 9.3 mCi F-18 FDG was injected intravenously. Full-ring PET imaging
was performed from the vertex to the feet after the radiotracer. CT
data was obtained and used for attenuation correction and anatomic
localization.
FASTING BLOOD GLUCOSE:  Value:  133 mg/dl

[Series 4: ct wb 5.0 hd_fov · axial · 5.0mm · 1.17mm/px · z∈[+110,+1030]mm · 3 of 462 slices shown]
[im 116/462  soft-tissue]
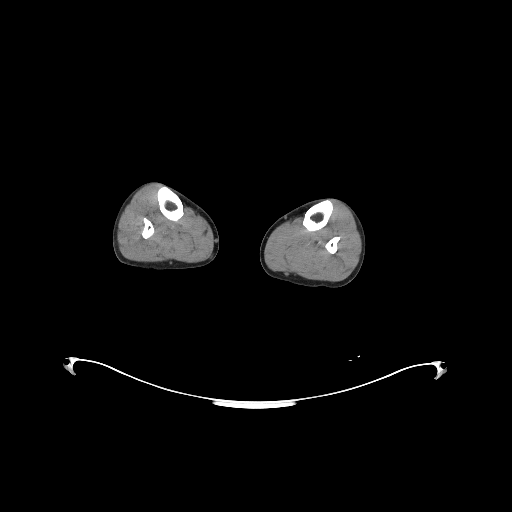
[im 231/462  soft-tissue]
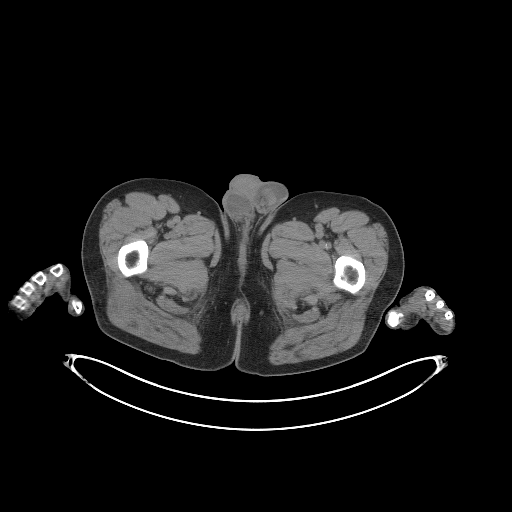
[im 346/462  soft-tissue]
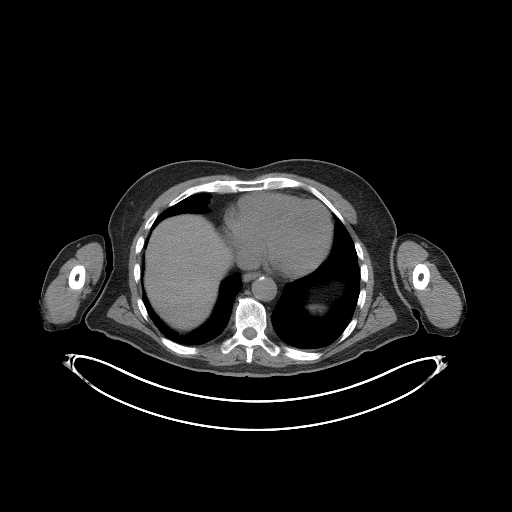

[3 of 25 positions shown; findings below may reference images not displayed]

FINDINGS: Head/Neck: No areas of abnormal hypermetabolism.

Chest: Subcarinal node which measures 11 mm (upper normal) and a
S.U.V. max of 3.7 on image 97 of series 4. Similar to the
surrounding mediastinal blood pool.

Abdomen/Pelvis: Bilateral hypermetabolic inguinal nodes. Index left
inguinal node measures 9 mm and a S.U.V. max of 2.2 on image 217.

A right inguinal node measures 9 mm and a S.U.V. max of 2.4 on image
223.

Skeleton: Left acromioclavicular joint hypermetabolism which is
likely degenerative. Hypermetabolism lateral to the left iliac
crest. This measures a S.U.V. max of 4.6, including on image 189. No
well-defined soft tissue mass in this area. Question slight
increased density within the adjacent gluteal musculature.

Equivocal mildly increased hypermetabolism about the more medial
left iliac bone. No well-defined lytic lesion. This measures a
S.U.V. max of 3.1, including on approximately image 189.

Extremities: No abnormal osseous or soft tissue hypermetabolism.

CT IMAGES PERFORMED FOR ATTENUATION: Bilateral carotid
atherosclerosis. No cervical adenopathy.

Mild cardiomegaly with LAD coronary artery atherosclerosis.
Calcifications or surgical clips in the left axilla. Trace bilateral
pleural fluid or thickening. 4 mm left lower lobe pulmonary nodule
on image 31. Mild right adrenal thickening. Normal left adrenal
gland. Low-density interpolar right renal lesion which is likely a
cyst. Abdominal aortic and branch vessel atherosclerosis. Mild
prostatomegaly. Small bilateral hydroceles.
IMPRESSION: 1. No convincing evidence of hypermetabolic active myeloma.
2. Hypermetabolism within the musculature lateral left iliac crest.
Suggestion of increased density within the musculature but no
well-defined mass. Question prior bone marrow biopsy. If there has
been no prior biopsy, this area is indeterminate and should be
considered for pre and post-contrast pelvic MRI.
3. Left iliac hypermetabolism is without CT correlate and favored to
be within normal variation.
4. Inguinal and subcarinal nodes which are favored to be reactive.
5. A 5 mm left lower lobe pulmonary nodule is nonspecific. Given the
history of melanoma, consider chest CT followup at 6 months.
6.  Atherosclerosis, including within the coronary arteries.

## 2016-02-04 ENCOUNTER — Telehealth: Payer: Self-pay | Admitting: Internal Medicine

## 2016-02-04 NOTE — Telephone Encounter (Signed)
ON RECALL FOR TCS °

## 2016-02-04 NOTE — Telephone Encounter (Signed)
Letter mailed to pt.  

## 2016-02-17 ENCOUNTER — Ambulatory Visit (HOSPITAL_COMMUNITY)
Admission: RE | Admit: 2016-02-17 | Discharge: 2016-02-17 | Disposition: A | Discharge status: Home / Self Care | Payer: Medicare Other | Source: Ambulatory Visit | Attending: Oncology | Admitting: Oncology

## 2016-02-17 DIAGNOSIS — R911 Solitary pulmonary nodule: Secondary | ICD-10-CM | POA: Diagnosis not present

## 2016-02-20 ENCOUNTER — Other Ambulatory Visit (HOSPITAL_COMMUNITY): Payer: Medicare Other

## 2016-02-20 ENCOUNTER — Ambulatory Visit (HOSPITAL_COMMUNITY): Payer: Medicare Other | Admitting: Oncology

## 2016-02-20 ENCOUNTER — Ambulatory Visit (HOSPITAL_COMMUNITY): Payer: Medicare Other | Admitting: Hematology & Oncology

## 2016-03-05 ENCOUNTER — Encounter (HOSPITAL_COMMUNITY): Payer: Self-pay | Admitting: Oncology

## 2016-03-05 ENCOUNTER — Other Ambulatory Visit (HOSPITAL_COMMUNITY): Payer: Medicare Other

## 2016-03-05 ENCOUNTER — Encounter (HOSPITAL_COMMUNITY): Payer: Medicare Other | Attending: Oncology | Admitting: Oncology

## 2016-03-05 DIAGNOSIS — Z823 Family history of stroke: Secondary | ICD-10-CM | POA: Diagnosis not present

## 2016-03-05 DIAGNOSIS — E7800 Pure hypercholesterolemia, unspecified: Secondary | ICD-10-CM | POA: Diagnosis not present

## 2016-03-05 DIAGNOSIS — I10 Essential (primary) hypertension: Secondary | ICD-10-CM | POA: Insufficient documentation

## 2016-03-05 DIAGNOSIS — Z809 Family history of malignant neoplasm, unspecified: Secondary | ICD-10-CM | POA: Insufficient documentation

## 2016-03-05 DIAGNOSIS — E119 Type 2 diabetes mellitus without complications: Secondary | ICD-10-CM | POA: Insufficient documentation

## 2016-03-05 DIAGNOSIS — D649 Anemia, unspecified: Secondary | ICD-10-CM | POA: Insufficient documentation

## 2016-03-05 DIAGNOSIS — Z85820 Personal history of malignant melanoma of skin: Secondary | ICD-10-CM | POA: Insufficient documentation

## 2016-03-05 DIAGNOSIS — Z87891 Personal history of nicotine dependence: Secondary | ICD-10-CM | POA: Insufficient documentation

## 2016-03-05 DIAGNOSIS — D472 Monoclonal gammopathy: Secondary | ICD-10-CM

## 2016-03-05 DIAGNOSIS — C9000 Multiple myeloma not having achieved remission: Secondary | ICD-10-CM | POA: Diagnosis not present

## 2016-03-05 LAB — CBC WITH DIFFERENTIAL/PLATELET
BASOS ABS: 0 10*3/uL (ref 0.0–0.1)
Basophils Relative: 0 %
Eosinophils Absolute: 0.2 10*3/uL (ref 0.0–0.7)
Eosinophils Relative: 4 %
HEMATOCRIT: 38.6 % — AB (ref 39.0–52.0)
HEMOGLOBIN: 13.6 g/dL (ref 13.0–17.0)
LYMPHS PCT: 34 %
Lymphs Abs: 1.2 10*3/uL (ref 0.7–4.0)
MCH: 32.5 pg (ref 26.0–34.0)
MCHC: 35.2 g/dL (ref 30.0–36.0)
MCV: 92.1 fL (ref 78.0–100.0)
Monocytes Absolute: 0.2 10*3/uL (ref 0.1–1.0)
Monocytes Relative: 6 %
NEUTROS ABS: 2 10*3/uL (ref 1.7–7.7)
NEUTROS PCT: 56 %
Platelets: 170 10*3/uL (ref 150–400)
RBC: 4.19 MIL/uL — AB (ref 4.22–5.81)
RDW: 12.7 % (ref 11.5–15.5)
WBC: 3.6 10*3/uL — AB (ref 4.0–10.5)

## 2016-03-05 LAB — COMPREHENSIVE METABOLIC PANEL
ALT: 26 U/L (ref 17–63)
AST: 27 U/L (ref 15–41)
Albumin: 4.5 g/dL (ref 3.5–5.0)
Alkaline Phosphatase: 58 U/L (ref 38–126)
Anion gap: 8 (ref 5–15)
BUN: 18 mg/dL (ref 6–20)
CO2: 30 mmol/L (ref 22–32)
Calcium: 9.4 mg/dL (ref 8.9–10.3)
Chloride: 98 mmol/L — ABNORMAL LOW (ref 101–111)
Creatinine, Ser: 1.17 mg/dL (ref 0.61–1.24)
GFR calc Af Amer: >60 mL/min (ref >60)
GFR, EST NON AFRICAN AMERICAN: 56 mL/min — AB (ref >60)
GLUCOSE: 170 mg/dL — AB (ref 65–99)
Potassium: 3.6 mmol/L (ref 3.5–5.1)
Sodium: 136 mmol/L (ref 135–145)
TOTAL PROTEIN: 8.6 g/dL — AB (ref 6.5–8.1)
Total Bilirubin: 0.9 mg/dL (ref 0.3–1.2)

## 2016-03-05 LAB — C-REACTIVE PROTEIN: CRP: <0.8 mg/dL (ref <1.0)

## 2016-03-05 LAB — LACTATE DEHYDROGENASE: LDH: 149 U/L (ref 98–192)

## 2016-03-05 LAB — IRON AND TIBC
IRON: 118 ug/dL (ref 45–182)
SATURATION RATIOS: 32 % (ref 17.9–39.5)
TIBC: 372 ug/dL (ref 250–450)
UIBC: 254 ug/dL

## 2016-03-05 LAB — FOLATE: Folate: 53.4 ng/mL (ref >5.9)

## 2016-03-05 LAB — SEDIMENTATION RATE: Sed Rate: 25 mm/hr — ABNORMAL HIGH (ref 0–16)

## 2016-03-05 LAB — FERRITIN: FERRITIN: 66 ng/mL (ref 24–336)

## 2016-03-05 LAB — VITAMIN B12: Vitamin B-12: 553 pg/mL (ref 180–914)

## 2016-03-05 NOTE — Progress Notes (Signed)
Maurice Cooper presented for labwork. Labs per MD order drawn via Peripheral Line 23 gauge needle inserted in lt ac Good blood return present. Procedure without incident.  Needle removed intact. Patient tolerated procedure well.

## 2016-03-05 NOTE — Assessment & Plan Note (Addendum)
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  Labs today: CBC diff, CMET, LDH, ESR, CRP, B2M, serum light chain assay, and multiple myeloma panel, anemia panel.  I personally reviewed and went over laboratory results with the patient.    I personally reviewed and went over radiographic studies with the patient.  The results are noted within this dictation.  Myeloma survey is negative for any new lytic lesions.  He is due for repeat skeletal survey in May 2018.  Order is placed.  CT of chest to follow-up on 5 mm LLL pulmonary nodule was completed on 02/17/2016.  Pulmonary nodule is stable.  No further surveillance is recommended after 18 months of stability.  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  Smoldering multiple myeloma - Smoldering multiple myeloma (SMM) is defined as:  ?M-protein ?3 g/dL and/or 10 to 60 percent bone marrow plasma cells, plus  ?No end-organ damage or other myeloma-defining events, and no amyloidosis  Thus, for the diagnosis of SMM, patients should not have any of the following myeloma-defining events:  ?End-organ damage (lytic lesions, anemia, renal disease, or hypercalcemia) that can be attributed to the underlying plasma cell disorder  ??60 percent clonal plasma cells in the bone marrow   ?Involved/uninvolved free light chain (FLC) ratio of 100 or more   ?Magnetic resonance imaging (MRI) with more than one focal lesion (involving bone or bone marrow) Asymptomatic patients with one or more of the myeloma-defining events listed above are considered to have MM rather than SMM because they have a risk of progression with complications of greater than 80 percent within two years.  Labs in 4 months: CBC diff, CMET, SPEP+IFE, light chain assay, IgG, IgM, IgA, B2M.  Return in 4 months for follow-up.

## 2016-03-05 NOTE — Progress Notes (Signed)
[name], MD [address] [place] [address]  Smoldering multiple myeloma (SMM) [facility]) - Plan: potassium chloride SA (K-DUR,KLOR-CON) 20 MEQ tablet, CBC with Differential, Comprehensive metabolic panel, Kappa/lambda light chains, IgG, IgA, IgM, Protein electrophoresis, serum, Immunofixation electrophoresis, DG Bone Survey Met, CBC with Differential, Comprehensive metabolic panel, Lactate dehydrogenase, Sedimentation rate, Vitamin B12, Folate, Iron and TIBC, Ferritin, Kappa/lambda light chains, Beta 2 microglobuline, serum, IgG, IgA, IgM, Immunofixation electrophoresis, Protein electrophoresis, serum, C-reactive protein  Anemia - Plan: CBC with Differential, Vitamin B12, Folate, Iron and TIBC, Ferritin  CURRENT THERAPY: Observation  INTERVAL HISTORY: Maurice Cooper 83 y.o. male returns for followup of smoldering multiple myeloma with a bone marrow biopsy on 07/19/14 demonstrating a 10% plasma cell with normal cytogenetics, negative PET scan on 08/14/2014 for lytic lesions, and an IgG of ~2400.  Doing very well he denies any complaints. He denies having any fractures. He denies any new pain.  He denies any fevers, chills, night sweats. Appetite is strong. Weight is stable. Performance status remains excellent.  Review of Systems  Constitutional: Negative.  Negative for chills, fever and weight loss.  HENT: Negative.   Eyes: Negative.   Respiratory: Negative.  Negative for cough.   Cardiovascular: Negative.  Negative for chest pain.  Gastrointestinal: Negative.  Negative for abdominal pain, constipation, diarrhea, nausea and vomiting.  Genitourinary: Negative.  Negative for dysuria.  Musculoskeletal: Negative.  Negative for back pain, falls, joint pain, myalgias and neck pain.  Skin: Negative.   Neurological: Negative.  Negative for weakness.  Endo/Heme/Allergies: Negative.  Does not bruise/bleed easily.  Psychiatric/Behavioral: Negative.     Past  Medical History:  Diagnosis Date  . Diabetes mellitus ([place])   . Hypercholesteremia   . Hypertension   . Melanoma ([place])    lower back  . Other pancytopenia ([place]) 02/25/2014  . Smoldering multiple myeloma (SMM) ([place]) 02/25/2014    Past Surgical History:  Procedure Laterality Date  . AMPUTATION FINGER / THUMB Left    forefinger  . BONE MARROW ASPIRATION Left 07/19/14  . BONE MARROW BIOPSY Left 07/19/14  . CATARACT EXTRACTION Bilateral   . CORNEAL TRANSPLANT Right   . MELANOMA EXCISION Left    left flank area 1980s  . TONSILLECTOMY      Family History  Problem Relation Age of Onset  . Cancer Mother   . Stroke Father     Social History   Social History  . Marital status: Widowed    Spouse name: N/A  . Number of children: N/A  . Years of education: N/A   Social History Main Topics  . Smoking status: Former Smoker    Types: Cigarettes  . Smokeless tobacco: Former Systems developer    Types: Chew  . Alcohol use No  . Drug use: No  . Sexual activity: Not on file   Other Topics Concern  . Not on file   Social History Narrative  . No narrative on file     PHYSICAL EXAMINATION  ECOG PERFORMANCE STATUS: 0 - Asymptomatic  Vitals:   03/05/16 1353 03/05/16 1358  BP: (!) 178/71 (!) 162/73  Pulse: (!) 59   Resp: 20   Temp: 97.7 F (36.5 C)     GENERAL:alert, no distress, well nourished, well developed, comfortable, cooperative, smiling and unaccompanied. 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 HEART: regular rate & rhythm, no murmurs, no gallops, S1 normal and S2 normal ABDOMEN:abdomen soft, non-tender and normal bowel sounds BACK: Back symmetric, no curvature. EXTREMITIES:less then 2 second capillary refill, no joint deformities, effusion, or inflammation, no skin discoloration, no cyanosis  NEURO: alert & oriented x 3 with fluent speech, no focal motor/sensory deficits, gait normal   LABORATORY DATA: CBC    Component Value Date/Time   WBC 4.1 10/21/2015 1012   RBC 4.05 (L) 10/21/2015 1012   HGB 12.6 (L) 10/21/2015 1012   HCT 37.4 (L) 10/21/2015 1012   PLT 201 10/21/2015 1012   MCV 92.3 10/21/2015 1012   MCH 31.1 10/21/2015 1012   MCHC 33.7 10/21/2015 1012   RDW 13.0 10/21/2015 1012   LYMPHSABS 1.4 10/21/2015 1012   MONOABS 0.3 10/21/2015 1012   EOSABS 0.2 10/21/2015 1012   BASOSABS 0.0 10/21/2015 1012      Chemistry      Component Value Date/Time   NA 138 01/20/2016 0928   K 3.5 01/20/2016 0928   CL 100 (L) 01/20/2016 0928   CO2 32 01/20/2016 0928   BUN 19 01/20/2016 0928   CREATININE 1.20 01/20/2016 0928      Component Value Date/Time   CALCIUM 9.2 01/20/2016 0928   ALKPHOS 60 10/21/2015 1012   AST 23 10/21/2015 1012   ALT 22 10/21/2015 1012   BILITOT 0.7 10/21/2015 1012        PENDING LABS:   RADIOGRAPHIC STUDIES:  Ct Chest Wo Contrast  Result Date: 02/17/2016 CLINICAL DATA:  Follow-up pulmonary nodule EXAM: CT CHEST WITHOUT CONTRAST TECHNIQUE: Multidetector CT imaging of the chest was performed following the standard protocol without IV contrast. COMPARISON:  CT chest dated 02/15/2015.  PET-CT dated 08/14/2014. FINDINGS: Cardiovascular: Heart is normal in size.  No pericardial effusion. Coronary atherosclerosis in the LAD. Atherosclerotic calcifications of the aortic arch. Mediastinum/Nodes: Small mediastinal lymph nodes, including an 8 mm short axis low right paratracheal node (series 2/ image 58) and a 12 mm short axis subcarinal node  (series 2/image 73), unchanged. Lungs/Pleura: Evaluation of lung parenchyma is mildly constrained by respiratory motion. 5 mm medial left lower lobe pulmonary nodule (series 3/image 83), unchanged. Biapical pleural-parenchymal scarring. No focal consolidation. No pleural effusion or pneumothorax. Upper Abdomen: Visualize upper abdomen is grossly unremarkable. Musculoskeletal:  Mild degenerative changes of the thoracic spine. IMPRESSION: 5 mm left lower lobe pulmonary nodule, unchanged. Eighteen month stability has been demonstrated. No dedicated follow-up imaging is required per Fleischner Society guidelines. This recommendation follows the consensus statement: Guidelines for Management of Small Pulmonary Nodules Detected on CT Images: From the Fleischner Society 2017; Radiology 2017; 284:228-243. Electronically Signed   By: Julian Hy M.D.   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  Labs today: CBC diff, CMET, LDH, ESR, CRP, B2M, serum light chain assay, and multiple myeloma panel, anemia panel.  I personally reviewed and went over laboratory results with the patient.    I personally reviewed  and went over radiographic studies with the patient.  The results are noted within this dictation.  Myeloma survey is negative for any new lytic lesions.  He is due for repeat skeletal survey in May 2018.  Order is placed.  CT of chest to follow-up on 5 mm LLL pulmonary nodule was completed on 02/17/2016.  Pulmonary nodule is stable.  No further surveillance is recommended after 18 months of stability.  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  Smoldering multiple myeloma - Smoldering multiple myeloma (SMM) is defined as:  ?M-protein ?3 g/dL and/or  10 to 60 percent bone marrow plasma cells, plus  ?No end-organ damage or other myeloma-defining events, and no amyloidosis  Thus, for the diagnosis of SMM, patients should not have any of the following myeloma-defining events:  ?End-organ damage (lytic lesions, anemia, renal disease, or hypercalcemia) that can be attributed to the underlying plasma cell disorder  ??60 percent clonal plasma cells in the bone marrow   ?Involved/uninvolved free light chain (FLC) ratio of 100 or more   ?Magnetic resonance imaging (MRI) with more than one focal lesion (involving bone or bone marrow) Asymptomatic patients with one or more of the myeloma-defining events listed above are considered to have MM rather than SMM because they have a risk of progression with complications of greater than 80 percent within two years.  Labs in 4 months: CBC diff, CMET, SPEP+IFE, light chain assay, IgG, IgM, IgA, B2M.  Return in 4 months for follow-up.   ORDERS PLACED FOR THIS ENCOUNTER: Orders Placed This Encounter  Procedures  . DG Bone Survey Met  . CBC with Differential  . Comprehensive metabolic panel  . Kappa/lambda light chains  . IgG, IgA, IgM  . Protein electrophoresis, serum  . Immunofixation electrophoresis    MEDICATIONS PRESCRIBED THIS ENCOUNTER: Meds ordered this encounter  Medications  . potassium chloride SA (K-DUR,KLOR-CON) 20 MEQ tablet    Sig: Take 20 mEq by mouth daily.    THERAPY PLAN:  Ongoing surveillance.   C= Calcium increase of greater than 11.5 mg/dL R = Renal insufficiency with creatinine > 2 mg/dL A= Anemia with Hgb < 10 g/dL B= Bone disease with lytic lesions and/or osteopenia   All questions were answered. The patient knows to call the clinic with any problems, questions or concerns. We can certainly see the patient much sooner if necessary.  Patient and plan discussed with Dr. Ancil Linsey and she is in agreement with the aforementioned.   This note is electronically  signed by: Robynn Pane, PA-C 03/05/2016 2:30 PM

## 2016-03-05 NOTE — Patient Instructions (Addendum)
[place] at [facility] Discharge Instructions  RECOMMENDATIONS MADE BY THE CONSULTANT AND ANY TEST RESULTS WILL BE SENT TO YOUR REFERRING PHYSICIAN.  Labs today  Skeletal survey May 2018  Labs 4 months  Return in 4 months for follow up   Thank you for choosing [place] at [facility] to provide your oncology and hematology care.  To afford each patient quality time with our provider, please arrive at least 15 minutes before your scheduled appointment time.    If you have a lab appointment with the [facility] please come in thru the  Main Entrance and check in at the main information desk  You need to re-schedule your appointment should you arrive 10 or more minutes late.  We strive to give you quality time with our providers, and arriving late affects you and other patients whose appointments are after yours.  Also, if you no show three or more times for appointments you may be dismissed from the clinic at the providers discretion.     Again, thank you for choosing [facility].  Our hope is that these requests will decrease the amount of time that you wait before being seen by our physicians.       _____________________________________________________________  Should you have questions after your visit to [facility], please contact our office at [phone] between the hours of 8:30 a.m. and 4:30 p.m.  Voicemails left after 4:30 p.m. will not be returned until the following business day.  For prescription refill requests, have your pharmacy contact our office.       Resources For Cancer Patients and their Caregivers ? American Cancer Society: Can assist with transportation, wigs, general needs, runs Look Good Feel Better.        8193165682 ? Cancer Care: Provides financial assistance, online support groups, medication/co-pay assistance.  1-800-813-HOPE 3200934067) ? [place] Assists [place] cancer patients and their families through emotional , educational and financial support.  443-793-9044 ? [place] DSS Where to apply for food stamps, Medicaid and utility assistance. (970) 417-6357 ? [employer]: Transportation to medical appointments. 563-449-2485 ? Social Security Administration: May apply for disability if have a Stage IV cancer. (608) 691-3894 (301)154-6001 ? [employer], Disability and Transit Services: Assists with nutrition, care and transit needs. [place] Support Programs: @10RELATIVEDAYS @ > Cancer Support Group  2nd Tuesday of the month 1pm-2pm, Journey Room  > Creative Journey  3rd Tuesday of the month 1130am-1pm, Journey Room  > Look Good Feel Better  1st Wednesday of the month 10am-12 noon, Journey Room (Call [place] to register [phone])

## 2016-03-06 LAB — PROTEIN ELECTROPHORESIS, SERUM
A/G Ratio: 1 (ref 0.7–1.7)
ALPHA-2-GLOBULIN: 0.7 g/dL (ref 0.4–1.0)
Albumin ELP: 4.5 g/dL — ABNORMAL HIGH (ref 2.9–4.4)
Alpha-1-Globulin: 0.2 g/dL (ref 0.0–0.4)
Beta Globulin: 1 g/dL (ref 0.7–1.3)
GAMMA GLOBULIN: 2.4 g/dL — AB (ref 0.4–1.8)
Globulin, Total: 4.3 g/dL — ABNORMAL HIGH (ref 2.2–3.9)
M-SPIKE, %: 1.9 g/dL — AB
Total Protein ELP: 8.8 g/dL — ABNORMAL HIGH (ref 6.0–8.5)

## 2016-03-06 LAB — IGG, IGA, IGM
IGA: 69 mg/dL (ref 61–437)
IGG (IMMUNOGLOBIN G), SERUM: 2519 mg/dL — AB (ref 700–1600)
IGM, SERUM: 30 mg/dL (ref 15–143)

## 2016-03-06 LAB — KAPPA/LAMBDA LIGHT CHAINS
Kappa free light chain: 18.6 mg/L (ref 3.3–19.4)
Kappa, lambda light chain ratio: 1.44 (ref 0.26–1.65)
LAMDA FREE LIGHT CHAINS: 12.9 mg/L (ref 5.7–26.3)

## 2016-03-06 LAB — BETA 2 MICROGLOBULIN, SERUM: Beta-2 Microglobulin: 2.3 mg/L (ref 0.6–2.4)

## 2016-03-09 LAB — IMMUNOFIXATION ELECTROPHORESIS
IGA: 73 mg/dL (ref 61–437)
IgG (Immunoglobin G), Serum: 2475 mg/dL — ABNORMAL HIGH (ref 700–1600)
IgM, Serum: 31 mg/dL (ref 15–143)
Total Protein ELP: 8.3 g/dL (ref 6.0–8.5)

## 2016-03-16 DIAGNOSIS — Z1283 Encounter for screening for malignant neoplasm of skin: Secondary | ICD-10-CM | POA: Diagnosis not present

## 2016-03-16 DIAGNOSIS — Z85820 Personal history of malignant melanoma of skin: Secondary | ICD-10-CM | POA: Diagnosis not present

## 2016-03-16 DIAGNOSIS — Z08 Encounter for follow-up examination after completed treatment for malignant neoplasm: Secondary | ICD-10-CM | POA: Diagnosis not present

## 2016-03-16 DIAGNOSIS — L570 Actinic keratosis: Secondary | ICD-10-CM | POA: Diagnosis not present

## 2016-03-16 DIAGNOSIS — Z85828 Personal history of other malignant neoplasm of skin: Secondary | ICD-10-CM | POA: Diagnosis not present

## 2016-03-18 DIAGNOSIS — Z961 Presence of intraocular lens: Secondary | ICD-10-CM | POA: Diagnosis not present

## 2016-03-18 DIAGNOSIS — H548 Legal blindness, as defined in USA: Secondary | ICD-10-CM | POA: Diagnosis not present

## 2016-03-18 DIAGNOSIS — H402213 Chronic angle-closure glaucoma, right eye, severe stage: Secondary | ICD-10-CM | POA: Diagnosis not present

## 2016-03-18 DIAGNOSIS — H40002 Preglaucoma, unspecified, left eye: Secondary | ICD-10-CM | POA: Diagnosis not present

## 2016-04-17 ENCOUNTER — Other Ambulatory Visit (HOSPITAL_COMMUNITY)
Admission: RE | Admit: 2016-04-17 | Discharge: 2016-04-17 | Disposition: A | Discharge status: Home / Self Care | Payer: Medicare Other | Source: Ambulatory Visit | Attending: Pulmonary Disease | Admitting: Pulmonary Disease

## 2016-04-17 DIAGNOSIS — I129 Hypertensive chronic kidney disease with stage 1 through stage 4 chronic kidney disease, or unspecified chronic kidney disease: Secondary | ICD-10-CM | POA: Diagnosis not present

## 2016-04-17 DIAGNOSIS — D472 Monoclonal gammopathy: Secondary | ICD-10-CM | POA: Diagnosis not present

## 2016-04-17 DIAGNOSIS — I10 Essential (primary) hypertension: Secondary | ICD-10-CM | POA: Insufficient documentation

## 2016-04-17 DIAGNOSIS — E1129 Type 2 diabetes mellitus with other diabetic kidney complication: Secondary | ICD-10-CM | POA: Insufficient documentation

## 2016-04-17 LAB — BASIC METABOLIC PANEL WITH GFR
Anion gap: 7 (ref 5–15)
BUN: 22 mg/dL — ABNORMAL HIGH (ref 6–20)
CO2: 32 mmol/L (ref 22–32)
Calcium: 9.2 mg/dL (ref 8.9–10.3)
Chloride: 98 mmol/L — ABNORMAL LOW (ref 101–111)
Creatinine, Ser: 1.32 mg/dL — ABNORMAL HIGH (ref 0.61–1.24)
GFR calc Af Amer: 56 mL/min — ABNORMAL LOW
GFR calc non Af Amer: 49 mL/min — ABNORMAL LOW
Glucose, Bld: 136 mg/dL — ABNORMAL HIGH (ref 65–99)
Potassium: 3.5 mmol/L (ref 3.5–5.1)
Sodium: 137 mmol/L (ref 135–145)

## 2016-04-18 LAB — HEMOGLOBIN A1C
Hgb A1c MFr Bld: 7.1 % — ABNORMAL HIGH (ref 4.8–5.6)
Mean Plasma Glucose: 157 mg/dL

## 2016-04-22 DIAGNOSIS — I10 Essential (primary) hypertension: Secondary | ICD-10-CM | POA: Diagnosis not present

## 2016-04-22 DIAGNOSIS — E119 Type 2 diabetes mellitus without complications: Secondary | ICD-10-CM | POA: Diagnosis not present

## 2016-04-22 DIAGNOSIS — I129 Hypertensive chronic kidney disease with stage 1 through stage 4 chronic kidney disease, or unspecified chronic kidney disease: Secondary | ICD-10-CM | POA: Diagnosis not present

## 2016-04-22 DIAGNOSIS — N183 Chronic kidney disease, stage 3 (moderate): Secondary | ICD-10-CM | POA: Diagnosis not present

## 2016-06-24 ENCOUNTER — Ambulatory Visit (HOSPITAL_COMMUNITY)
Admission: RE | Admit: 2016-06-24 | Discharge: 2016-06-24 | Disposition: A | Discharge status: Home / Self Care | Payer: Medicare Other | Source: Ambulatory Visit | Attending: Oncology | Admitting: Oncology

## 2016-06-24 DIAGNOSIS — I6529 Occlusion and stenosis of unspecified carotid artery: Secondary | ICD-10-CM | POA: Insufficient documentation

## 2016-06-24 DIAGNOSIS — I739 Peripheral vascular disease, unspecified: Secondary | ICD-10-CM | POA: Insufficient documentation

## 2016-06-24 DIAGNOSIS — C9000 Multiple myeloma not having achieved remission: Secondary | ICD-10-CM | POA: Insufficient documentation

## 2016-06-24 DIAGNOSIS — D472 Monoclonal gammopathy: Secondary | ICD-10-CM

## 2016-06-26 ENCOUNTER — Encounter (HOSPITAL_COMMUNITY): Payer: Medicare Other | Attending: Oncology

## 2016-06-26 DIAGNOSIS — C9000 Multiple myeloma not having achieved remission: Secondary | ICD-10-CM | POA: Insufficient documentation

## 2016-06-26 DIAGNOSIS — D472 Monoclonal gammopathy: Secondary | ICD-10-CM

## 2016-06-26 LAB — CBC WITH DIFFERENTIAL/PLATELET
Basophils Absolute: 0 10*3/uL (ref 0.0–0.1)
Basophils Relative: 0 %
EOS ABS: 0.2 10*3/uL (ref 0.0–0.7)
EOS PCT: 4 %
HCT: 36.8 % — ABNORMAL LOW (ref 39.0–52.0)
HEMOGLOBIN: 12.7 g/dL — AB (ref 13.0–17.0)
LYMPHS ABS: 1.4 10*3/uL (ref 0.7–4.0)
LYMPHS PCT: 37 %
MCH: 31.4 pg (ref 26.0–34.0)
MCHC: 34.5 g/dL (ref 30.0–36.0)
MCV: 91.1 fL (ref 78.0–100.0)
MONOS PCT: 10 %
Monocytes Absolute: 0.4 10*3/uL (ref 0.1–1.0)
NEUTROS PCT: 49 %
Neutro Abs: 1.8 10*3/uL (ref 1.7–7.7)
Platelets: 158 10*3/uL (ref 150–400)
RBC: 4.04 MIL/uL — AB (ref 4.22–5.81)
RDW: 12.7 % (ref 11.5–15.5)
WBC: 3.7 10*3/uL — AB (ref 4.0–10.5)

## 2016-06-26 LAB — COMPREHENSIVE METABOLIC PANEL
ALK PHOS: 59 U/L (ref 38–126)
ALT: 21 U/L (ref 17–63)
ANION GAP: 7 (ref 5–15)
AST: 25 U/L (ref 15–41)
Albumin: 4.3 g/dL (ref 3.5–5.0)
BILIRUBIN TOTAL: 0.9 mg/dL (ref 0.3–1.2)
BUN: 22 mg/dL — ABNORMAL HIGH (ref 6–20)
CALCIUM: 9.5 mg/dL (ref 8.9–10.3)
CO2: 29 mmol/L (ref 22–32)
CREATININE: 1.21 mg/dL (ref 0.61–1.24)
Chloride: 101 mmol/L (ref 101–111)
GFR, EST NON AFRICAN AMERICAN: 54 mL/min — AB (ref >60)
Glucose, Bld: 151 mg/dL — ABNORMAL HIGH (ref 65–99)
Potassium: 3.9 mmol/L (ref 3.5–5.1)
Sodium: 137 mmol/L (ref 135–145)
TOTAL PROTEIN: 8.5 g/dL — AB (ref 6.5–8.1)

## 2016-06-27 LAB — IGG, IGA, IGM
IGM, SERUM: 27 mg/dL (ref 15–143)
IgA: 61 mg/dL (ref 61–437)
IgG (Immunoglobin G), Serum: 2189 mg/dL — ABNORMAL HIGH (ref 700–1600)

## 2016-06-29 LAB — PROTEIN ELECTROPHORESIS, SERUM
A/G Ratio: 1.2 (ref 0.7–1.7)
ALBUMIN ELP: 4.3 g/dL (ref 2.9–4.4)
Alpha-1-Globulin: 0.2 g/dL (ref 0.0–0.4)
Alpha-2-Globulin: 0.6 g/dL (ref 0.4–1.0)
Beta Globulin: 0.8 g/dL (ref 0.7–1.3)
GAMMA GLOBULIN: 2 g/dL — AB (ref 0.4–1.8)
Globulin, Total: 3.6 g/dL (ref 2.2–3.9)
M-Spike, %: 1.7 g/dL — ABNORMAL HIGH
TOTAL PROTEIN ELP: 7.9 g/dL (ref 6.0–8.5)

## 2016-06-30 LAB — KAPPA/LAMBDA LIGHT CHAINS
KAPPA, LAMDA LIGHT CHAIN RATIO: 1.35 (ref 0.26–1.65)
Kappa free light chain: 17.9 mg/L (ref 3.3–19.4)
Lambda free light chains: 13.3 mg/L (ref 5.7–26.3)

## 2016-07-02 LAB — IMMUNOFIXATION ELECTROPHORESIS
IgA: 64 mg/dL (ref 61–437)
IgG (Immunoglobin G), Serum: 2204 mg/dL — ABNORMAL HIGH (ref 700–1600)
IgM, Serum: 31 mg/dL (ref 15–143)
TOTAL PROTEIN ELP: 7.9 g/dL (ref 6.0–8.5)

## 2016-07-03 ENCOUNTER — Encounter (HOSPITAL_COMMUNITY): Payer: Self-pay | Admitting: Adult Health

## 2016-07-03 ENCOUNTER — Ambulatory Visit (HOSPITAL_COMMUNITY): Payer: Medicare Other | Admitting: Adult Health

## 2016-07-03 ENCOUNTER — Encounter (HOSPITAL_COMMUNITY): Payer: Medicare Other | Attending: Adult Health | Admitting: Adult Health

## 2016-07-03 VITALS — BP 173/64 | HR 60 | Temp 97.5°F | Resp 18 | Wt 179.0 lb

## 2016-07-03 DIAGNOSIS — C9000 Multiple myeloma not having achieved remission: Secondary | ICD-10-CM | POA: Insufficient documentation

## 2016-07-03 DIAGNOSIS — D472 Monoclonal gammopathy: Secondary | ICD-10-CM

## 2016-07-03 NOTE — Patient Instructions (Addendum)
[place] at [facility] Discharge Instructions  RECOMMENDATIONS MADE BY THE CONSULTANT AND ANY TEST RESULTS WILL BE SENT TO YOUR REFERRING PHYSICIAN.  You saw Mike Craze, NP, today RTC in 4 months for FU; labs about 1 week prior to FU visit.   See Amy at checkout for appointments  Thank you for choosing [place] at [facility] to provide your oncology and hematology care.  To afford each patient quality time with our provider, please arrive at least 15 minutes before your scheduled appointment time.    If you have a lab appointment with the [facility] please come in thru the  Main Entrance and check in at the main information desk  You need to re-schedule your appointment should you arrive 10 or more minutes late.  We strive to give you quality time with our providers, and arriving late affects you and other patients whose appointments are after yours.  Also, if you no show three or more times for appointments you may be dismissed from the clinic at the providers discretion.     Again, thank you for choosing [facility].  Our hope is that these requests will decrease the amount of time that you wait before being seen by our physicians.       _____________________________________________________________  Should you have questions after your visit to [facility], please contact our office at [phone] between the hours of 8:30 a.m. and 4:30 p.m.  Voicemails left after 4:30 p.m. will not be returned until the following business day.  For prescription refill requests, have your pharmacy contact our office.       Resources For Cancer Patients and their Caregivers ? American Cancer Society: Can assist with transportation, wigs, general needs, runs Look Good Feel Better.        610-397-1395 ? Cancer Care: Provides financial assistance, online support groups, medication/co-pay assistance.  1-800-813-HOPE  236 075 0703) ? [place] Assists [place] cancer patients and their families through emotional , educational and financial support.  332-591-2639 ? [place] DSS Where to apply for food stamps, Medicaid and utility assistance. (269)302-6328 ? RCATS: Transportation to medical appointments. 619-461-3703 ? Social Security Administration: May apply for disability if have a Stage IV cancer. 813-106-4390 458 634 4469 ? [employer], Disability and Transit Services: Assists with nutrition, care and transit needs. [place] Support Programs: @10RELATIVEDAYS @ > Cancer Support Group  2nd Tuesday of the month 1pm-2pm, Journey Room  > Creative Journey  3rd Tuesday of the month 1130am-1pm, Journey Room  > Look Good Feel Better  1st Wednesday of the month 10am-12 noon, Journey Room (Call [place] to register [phone])

## 2016-07-05 NOTE — Progress Notes (Signed)
 [facility] [address] [place], [place] [address]   CLINIC:  Medical Oncology/Hematology  PCP:  Hawkins, Edward, MD [address] [place] [address] [phone]   REASON FOR VISIT:  Follow-up for Smoldering multiple myeloma   CURRENT THERAPY: Observation    BRIEF ONCOLOGIC HISTORY:    Smoldering multiple myeloma (SMM) (HCC)   02/25/2014 Initial Diagnosis    Smoldering multiple myeloma (SMM) (HCC)     06/25/2016 Imaging    Bone survey- 1. Stable exam. No significant lytic lesions identified. Exam unchanged from prior metastatic bone survey of 5:16 2017 .  2. Carotid, aortoiliac, peripheral vascular disease.        HISTORY OF PRESENT ILLNESS:  (From Tom Kefalas, PA-C's last note on 03/05/16)     INTERVAL HISTORY:  Maurice Cooper 84 y.o. male returns for routine follow-up for smoldering multiple myeloma.    Overall, he tells me he has been feeling well. Appetite is 100%; energy levels 75%. Denies bone pain. Denies any cough, shortness of breath, bowel/bladder changes, headaches, or bleeding episodes.   He remains active by exercising at [employer] 2-3x/week; he continues to do his won yard work and housework.   Overall, he is largely without complaints today.   For fun, he enjoys sports especially the [place] [employer] and [place] [employer].    REVIEW OF SYSTEMS:  Review of Systems  Constitutional: Negative.  Negative for chills, fatigue and fever.  HENT:  Negative.  Negative for lump/mass and nosebleeds.   Eyes: Negative.   Respiratory: Negative.  Negative for cough and shortness of breath.   Cardiovascular: Negative.  Negative for chest pain and leg swelling.  Gastrointestinal: Negative.  Negative for abdominal pain, blood in stool, constipation, diarrhea, nausea and vomiting.  Endocrine: Negative.   Genitourinary: Negative.  Negative for dysuria and hematuria.   Musculoskeletal: Negative.  Negative for arthralgias.  Skin:  Negative.  Negative for rash.  Neurological: Negative.  Negative for dizziness and headaches.  Hematological: Negative.  Negative for adenopathy. Does not bruise/bleed easily.  Psychiatric/Behavioral: Negative.  Negative for depression and sleep disturbance. The patient is not nervous/anxious.      PAST MEDICAL/SURGICAL HISTORY:  Past Medical History:  Diagnosis Date  . Diabetes mellitus (HCC)   . Hypercholesteremia   . Hypertension   . Melanoma (HCC)    lower back  . Other pancytopenia (HCC) 02/25/2014  . Smoldering multiple myeloma (SMM) (HCC) 02/25/2014   Past Surgical History:  Procedure Laterality Date  . AMPUTATION FINGER / THUMB Left    forefinger  . BONE MARROW ASPIRATION Left 07/19/14  . BONE MARROW BIOPSY Left 07/19/14  . CATARACT EXTRACTION Bilateral   . CORNEAL TRANSPLANT Right   . MELANOMA EXCISION Left    left flank area 1980s  . TONSILLECTOMY       SOCIAL HISTORY:  Social History   Social History  . Marital status: Widowed    Spouse name: N/A  . Number of children: N/A  . Years of education: N/A   Occupational History  . Not on file.   Social History Main Topics  . Smoking status: Former Smoker    Types: Cigarettes  . Smokeless tobacco: Former User    Types: Chew  . Alcohol use No  . Drug use: No  . Sexual activity: Not on file   Other Topics Concern  . Not on file   Social History Narrative  . No narrative on file    FAMILY   HISTORY:  Family History  Problem Relation Age of Onset  . Cancer Mother   . Stroke Father     CURRENT MEDICATIONS:  Outpatient Encounter Prescriptions as of 07/03/2016  Medication Sig  . amLODipine (NORVASC) 10 MG tablet Take 10 mg by mouth daily.   . aspirin EC 325 MG tablet Take 325 mg by mouth daily.  . doxazosin (CARDURA) 4 MG tablet   . DOXAZOSIN MESYLATE PO Take 4 mg by mouth daily.  . finasteride (PROSCAR) 5 MG tablet Take 5 mg by mouth daily.  . glipiZIDE (GLUCOTROL XL) 5 MG 24 hr tablet Take 5 mg by  mouth daily with breakfast.   . metoprolol (LOPRESSOR) 100 MG tablet Take 100 mg by mouth 2 (two) times daily.  . Multiple Vitamins-Minerals (CENTRUM SILVER PO) Take 1 capsule by mouth daily.  . potassium chloride SA (K-DUR,KLOR-CON) 20 MEQ tablet Take 20 mEq by mouth daily.  . pravastatin (PRAVACHOL) 40 MG tablet Take 40 mg by mouth daily.  . timolol (TIMOPTIC) 0.5 % ophthalmic solution Place 1 drop into the right eye daily.   . valsartan-hydrochlorothiazide (DIOVAN-HCT) 160-12.5 MG per tablet Take 1 tablet by mouth daily.  . vitamin C (ASCORBIC ACID) 500 MG tablet Take 500 mg by mouth daily.   No facility-administered encounter medications on file as of 07/03/2016.     ALLERGIES:  No Known Allergies   PHYSICAL EXAM:  ECOG Performance status: 0 - Asymptomatic   Vitals:   07/03/16 1314  BP: (!) 173/64  Pulse: 60  Resp: 18  Temp: 97.5 F (36.4 C)   Filed Weights   07/03/16 1314  Weight: 179 lb (81.2 kg)    Physical Exam  Constitutional: He is oriented to person, place, and time and well-developed, well-nourished, and in no distress.  HENT:  Head: Normocephalic.  Mouth/Throat: Oropharynx is clear and moist. No oropharyngeal exudate.  Eyes: Conjunctivae are normal. Pupils are equal, round, and reactive to light. No scleral icterus.  (R) eye cloudy/right eye deformity  Neck: Normal range of motion. Neck supple.  Cardiovascular: Normal rate and regular rhythm.   Pulmonary/Chest: Effort normal and breath sounds normal. No respiratory distress. He has no wheezes. He has no rales.  Abdominal: Soft. Bowel sounds are normal. There is no tenderness. There is no rebound and no guarding.  Musculoskeletal: Normal range of motion. He exhibits no edema.  (L) index finger absent   Lymphadenopathy:    He has no cervical adenopathy.  Neurological: He is alert and oriented to person, place, and time. No cranial nerve deficit. Gait normal.  Skin: Skin is warm and dry. No rash noted.    Psychiatric: Mood, memory, affect and judgment normal.  Nursing note and vitals reviewed.    LABORATORY DATA:  I have reviewed the labs as listed.  CBC    Component Value Date/Time   WBC 3.7 (L) 06/26/2016 1237   RBC 4.04 (L) 06/26/2016 1237   HGB 12.7 (L) 06/26/2016 1237   HCT 36.8 (L) 06/26/2016 1237   PLT 158 06/26/2016 1237   MCV 91.1 06/26/2016 1237   MCH 31.4 06/26/2016 1237   MCHC 34.5 06/26/2016 1237   RDW 12.7 06/26/2016 1237   LYMPHSABS 1.4 06/26/2016 1237   MONOABS 0.4 06/26/2016 1237   EOSABS 0.2 06/26/2016 1237   BASOSABS 0.0 06/26/2016 1237   CMP Latest Ref Rng & Units 06/26/2016 04/17/2016 03/05/2016  Glucose 65 - 99 mg/dL 151(H) 136(H) 170(H)  BUN 6 - 20 mg/dL 22(H) 22(H) 18    Creatinine 0.61 - 1.24 mg/dL 1.21 1.32(H) 1.17  Sodium 135 - 145 mmol/L 137 137 136  Potassium 3.5 - 5.1 mmol/L 3.9 3.5 3.6  Chloride 101 - 111 mmol/L 101 98(L) 98(L)  CO2 22 - 32 mmol/L 29 32 30  Calcium 8.9 - 10.3 mg/dL 9.5 9.2 9.4  Total Protein 6.5 - 8.1 g/dL 8.5(H) - 8.6(H)  Total Bilirubin 0.3 - 1.2 mg/dL 0.9 - 0.9  Alkaline Phos 38 - 126 U/L 59 - 58  AST 15 - 41 U/L 25 - 27  ALT 17 - 63 U/L 21 - 26     Ref. Range 06/26/2016 12:38  Total Protein ELP Latest Ref Range: 6.0 - 8.5 g/dL 7.9  Albumin ELP Latest Ref Range: 2.9 - 4.4 g/dL 4.3  Globulin, Total Latest Ref Range: 2.2 - 3.9 g/dL 3.6  A/G Ratio Latest Ref Range: 0.7 - 1.7  1.2  Alpha-1-Globulin Latest Ref Range: 0.0 - 0.4 g/dL 0.2  Alpha-2-Globulin Latest Ref Range: 0.4 - 1.0 g/dL 0.6  Beta Globulin Latest Ref Range: 0.7 - 1.3 g/dL 0.8  Gamma Globulin Latest Ref Range: 0.4 - 1.8 g/dL 2.0 (H)  M-SPIKE, % Latest Ref Range: Not Observed g/dL 1.7 (H)  SPE Interp. Unknown Comment  Comment Unknown Comment     Ref. Range 06/26/2016 12:37  IgG (Immunoglobin G), Serum Latest Ref Range: 700 - 1,600 mg/dL 2,189 (H)  IgA Latest Ref Range: 61 - 437 mg/dL 61  IgM, Serum Latest Ref Range: 15 - 143 mg/dL 27  Kappa free light chain  Latest Ref Range: 3.3 - 19.4 mg/L 17.9  Lamda free light chains Latest Ref Range: 5.7 - 26.3 mg/L 13.3  Kappa, lamda light chain ratio Latest Ref Range: 0.26 - 1.65  1.35     PENDING LABS:    DIAGNOSTIC IMAGING:  *The following radiologic images and reports have been reviewed independently and agree with below findings.  Myeloma bone survey: 06/24/16    PATHOLOGY:  Bone marrow biopsy: 07/19/14   Cytogenetics: 07/19/14        ASSESSMENT & PLAN:   Smoldering multiple myeloma:  -Diagnosed in 2016. Bone marrow biopsy in 07/2014 with 10% plasma cells and normal cytogenetics. PET scan in 08/2014 demonstrated no lytic lesions. IgG at diagnosis ~2400. No active treatment.  -Recent myeloma skeletal survey on 06/24/16 negative for lytic lesions. Continue annual bone surveys.  -Myeloma labs are stable and reviewed with patient today.  Remains with elevated IgA. M-spike stable at 1.7%; kappa/lambda light chains and ratio are also stable.  -Return to cancer center in 4 months with labs. We will have him come about 1 week before scheduled follow-up visit so that myeloma labs are available to review together at office visit.   Health maintenance/Wellness promotion:  -Commended his exercise efforts and encouraged him to keep up the good work.  Continue healthy diet.  -Maintain follow-up with PCP as directed for physical exams, age/gender appropriate cancer screenings, and vaccinations.      Dispo:  -Return to cancer center in 4 months with labs.    All questions were answered to patient's stated satisfaction. Encouraged patient to call with any new concerns or questions before his next visit to the cancer center and we can certain see him sooner, if needed.    Plan of care discussed with Dr. Zhou, who agrees with the above aforementioned.    Orders placed this encounter:  Orders Placed This Encounter  Procedures  . CBC with Differential/Platelet  . Comprehensive metabolic   panel  .  Immunofixation electrophoresis  . IgG, IgA, IgM  . Kappa/lambda light chains  . Protein electrophoresis, serum      Gretchen Dawson, NP [facility] [phone]   

## 2016-07-15 DIAGNOSIS — E119 Type 2 diabetes mellitus without complications: Secondary | ICD-10-CM | POA: Diagnosis not present

## 2016-07-15 DIAGNOSIS — I129 Hypertensive chronic kidney disease with stage 1 through stage 4 chronic kidney disease, or unspecified chronic kidney disease: Secondary | ICD-10-CM | POA: Diagnosis not present

## 2016-07-15 DIAGNOSIS — N183 Chronic kidney disease, stage 3 (moderate): Secondary | ICD-10-CM | POA: Diagnosis not present

## 2016-07-15 DIAGNOSIS — I10 Essential (primary) hypertension: Secondary | ICD-10-CM | POA: Diagnosis not present

## 2016-07-23 DIAGNOSIS — N183 Chronic kidney disease, stage 3 (moderate): Secondary | ICD-10-CM | POA: Diagnosis not present

## 2016-07-23 DIAGNOSIS — E1129 Type 2 diabetes mellitus with other diabetic kidney complication: Secondary | ICD-10-CM | POA: Diagnosis not present

## 2016-07-23 DIAGNOSIS — I10 Essential (primary) hypertension: Secondary | ICD-10-CM | POA: Diagnosis not present

## 2016-07-23 DIAGNOSIS — C9000 Multiple myeloma not having achieved remission: Secondary | ICD-10-CM | POA: Diagnosis not present

## 2016-08-25 DIAGNOSIS — H40012 Open angle with borderline findings, low risk, left eye: Secondary | ICD-10-CM | POA: Diagnosis not present

## 2016-09-21 DIAGNOSIS — Z961 Presence of intraocular lens: Secondary | ICD-10-CM | POA: Diagnosis not present

## 2016-09-21 DIAGNOSIS — H548 Legal blindness, as defined in USA: Secondary | ICD-10-CM | POA: Diagnosis not present

## 2016-09-21 DIAGNOSIS — H402213 Chronic angle-closure glaucoma, right eye, severe stage: Secondary | ICD-10-CM | POA: Diagnosis not present

## 2016-09-21 DIAGNOSIS — H40012 Open angle with borderline findings, low risk, left eye: Secondary | ICD-10-CM | POA: Diagnosis not present

## 2016-10-14 DIAGNOSIS — C9000 Multiple myeloma not having achieved remission: Secondary | ICD-10-CM | POA: Diagnosis not present

## 2016-10-14 DIAGNOSIS — I10 Essential (primary) hypertension: Secondary | ICD-10-CM | POA: Diagnosis not present

## 2016-10-14 DIAGNOSIS — E1129 Type 2 diabetes mellitus with other diabetic kidney complication: Secondary | ICD-10-CM | POA: Diagnosis not present

## 2016-10-14 DIAGNOSIS — N183 Chronic kidney disease, stage 3 (moderate): Secondary | ICD-10-CM | POA: Diagnosis not present

## 2016-10-20 DIAGNOSIS — Z23 Encounter for immunization: Secondary | ICD-10-CM | POA: Diagnosis not present

## 2016-10-20 DIAGNOSIS — Z0000 Encounter for general adult medical examination without abnormal findings: Secondary | ICD-10-CM | POA: Diagnosis not present

## 2016-10-26 ENCOUNTER — Encounter (HOSPITAL_COMMUNITY): Payer: Medicare Other | Attending: Oncology

## 2016-10-26 DIAGNOSIS — D472 Monoclonal gammopathy: Secondary | ICD-10-CM

## 2016-10-26 DIAGNOSIS — C9000 Multiple myeloma not having achieved remission: Secondary | ICD-10-CM | POA: Insufficient documentation

## 2016-10-26 LAB — COMPREHENSIVE METABOLIC PANEL
ALBUMIN: 4.1 g/dL (ref 3.5–5.0)
ALT: 20 U/L (ref 17–63)
ANION GAP: 8 (ref 5–15)
AST: 21 U/L (ref 15–41)
Alkaline Phosphatase: 63 U/L (ref 38–126)
BUN: 20 mg/dL (ref 6–20)
CO2: 31 mmol/L (ref 22–32)
Calcium: 9.3 mg/dL (ref 8.9–10.3)
Chloride: 99 mmol/L — ABNORMAL LOW (ref 101–111)
Creatinine, Ser: 1.1 mg/dL (ref 0.61–1.24)
GFR calc non Af Amer: >60 mL/min (ref >60)
GLUCOSE: 147 mg/dL — AB (ref 65–99)
Potassium: 3.7 mmol/L (ref 3.5–5.1)
Sodium: 138 mmol/L (ref 135–145)
Total Bilirubin: 0.7 mg/dL (ref 0.3–1.2)
Total Protein: 8.2 g/dL — ABNORMAL HIGH (ref 6.5–8.1)

## 2016-10-26 LAB — CBC WITH DIFFERENTIAL/PLATELET
BASOS PCT: 0 %
Basophils Absolute: 0 10*3/uL (ref 0.0–0.1)
Eosinophils Absolute: 0.2 10*3/uL (ref 0.0–0.7)
Eosinophils Relative: 6 %
HEMATOCRIT: 35.8 % — AB (ref 39.0–52.0)
HEMOGLOBIN: 12.4 g/dL — AB (ref 13.0–17.0)
LYMPHS ABS: 1 10*3/uL (ref 0.7–4.0)
Lymphocytes Relative: 33 %
MCH: 32.2 pg (ref 26.0–34.0)
MCHC: 34.6 g/dL (ref 30.0–36.0)
MCV: 93 fL (ref 78.0–100.0)
MONOS PCT: 9 %
Monocytes Absolute: 0.3 10*3/uL (ref 0.1–1.0)
NEUTROS ABS: 1.6 10*3/uL — AB (ref 1.7–7.7)
NEUTROS PCT: 52 %
Platelets: 167 10*3/uL (ref 150–400)
RBC: 3.85 MIL/uL — AB (ref 4.22–5.81)
RDW: 13.1 % (ref 11.5–15.5)
WBC: 3.1 10*3/uL — AB (ref 4.0–10.5)

## 2016-10-27 LAB — PROTEIN ELECTROPHORESIS, SERUM
A/G Ratio: 0.9 (ref 0.7–1.7)
Albumin ELP: 3.6 g/dL (ref 2.9–4.4)
Alpha-1-Globulin: 0.2 g/dL (ref 0.0–0.4)
Alpha-2-Globulin: 0.8 g/dL (ref 0.4–1.0)
Beta Globulin: 0.9 g/dL (ref 0.7–1.3)
GAMMA GLOBULIN: 2.3 g/dL — AB (ref 0.4–1.8)
GLOBULIN, TOTAL: 4.2 g/dL — AB (ref 2.2–3.9)
M-SPIKE, %: 1.8 g/dL — AB
TOTAL PROTEIN ELP: 7.8 g/dL (ref 6.0–8.5)

## 2016-10-27 LAB — IMMUNOFIXATION ELECTROPHORESIS
IGG (IMMUNOGLOBIN G), SERUM: 2211 mg/dL — AB (ref 700–1600)
IgA: 67 mg/dL (ref 61–437)
IgM (Immunoglobulin M), Srm: 30 mg/dL (ref 15–143)
TOTAL PROTEIN ELP: 7.8 g/dL (ref 6.0–8.5)

## 2016-10-27 LAB — IGG, IGA, IGM
IGM (IMMUNOGLOBULIN M), SRM: 30 mg/dL (ref 15–143)
IgA: 68 mg/dL (ref 61–437)
IgG (Immunoglobin G), Serum: 2287 mg/dL — ABNORMAL HIGH (ref 700–1600)

## 2016-10-27 LAB — KAPPA/LAMBDA LIGHT CHAINS
KAPPA FREE LGHT CHN: 18 mg/L (ref 3.3–19.4)
KAPPA, LAMDA LIGHT CHAIN RATIO: 1.29 (ref 0.26–1.65)
Lambda free light chains: 13.9 mg/L (ref 5.7–26.3)

## 2016-11-02 ENCOUNTER — Encounter (HOSPITAL_COMMUNITY): Payer: Medicare Other | Attending: Oncology | Admitting: Oncology

## 2016-11-02 VITALS — BP 182/68 | HR 53 | Temp 97.6°F | Resp 20 | Wt 180.6 lb

## 2016-11-02 DIAGNOSIS — C9000 Multiple myeloma not having achieved remission: Secondary | ICD-10-CM | POA: Insufficient documentation

## 2016-11-02 DIAGNOSIS — D472 Monoclonal gammopathy: Secondary | ICD-10-CM

## 2016-11-02 NOTE — Patient Instructions (Signed)
[facility] at [facility] Discharge Instructions  RECOMMENDATIONS MADE BY THE CONSULTANT AND ANY TEST RESULTS WILL BE SENT TO YOUR REFERRING PHYSICIAN.  Today you saw Dr. Zhou  Thank you for choosing [facility] at [facility] to provide your oncology and hematology care.  To afford each patient quality time with our provider, please arrive at least 15 minutes before your scheduled appointment time.    If you have a lab appointment with the Cancer Center please come in thru the  Main Entrance and check in at the main information desk  You need to re-schedule your appointment should you arrive 10 or more minutes late.  We strive to give you quality time with our providers, and arriving late affects you and other patients whose appointments are after yours.  Also, if you no show three or more times for appointments you may be dismissed from the clinic at the providers discretion.     Again, thank you for choosing [facility].  Our hope is that these requests will decrease the amount of time that you wait before being seen by our physicians.       _____________________________________________________________  Should you have questions after your visit to [facility], please contact our office at [phone] between the hours of 8:30 a.m. and 4:30 p.m.  Voicemails left after 4:30 p.m. will not be returned until the following business day.  For prescription refill requests, have your pharmacy contact our office.       Resources For Cancer Patients and their Caregivers ? American Cancer Society: Can assist with transportation, wigs, general needs, runs Look Good Feel Better.        1-888-227-6333 ? Cancer Care: Provides financial assistance, online support groups, medication/co-pay assistance.  1-800-813-HOPE (4673) ? [facility] Assists [place] cancer patients and their families through  emotional , educational and financial support.  336-427-4357 ? [place] DSS Where to apply for food stamps, Medicaid and utility assistance. 336-342-1394 ? [facility]: Transportation to medical appointments. 336-347-2287 ? Social Security Administration: May apply for disability if have a Stage IV cancer. 336-342-7796 1-800-772-1213 ? [place] Aging, Disability and Transit Services: Assists with nutrition, care and transit needs. 336-349-2343  Cancer Center Support Programs: @[email or web address]@ > Cancer Support Group  2nd Tuesday of the month 1pm-2pm, Journey Room  > Creative Journey  3rd Tuesday of the month 1130am-1pm, Journey Room  > Look Good Feel Better  1st Wednesday of the month 10am-12 noon, Journey Room (Call American Cancer Society to register 1-800-395-5775)   

## 2016-11-02 NOTE — Progress Notes (Signed)
[place], [place] [address]   CLINIC:  Medical Oncology/Hematology  PCP:  Sinda Du, MD [place] [address] [phone]   REASON FOR VISIT:  Follow-up for Smoldering multiple myeloma   CURRENT THERAPY: Observation    BRIEF ONCOLOGIC HISTORY:    Smoldering multiple myeloma (SMM) ([place])   02/25/2014 Initial Diagnosis    Smoldering multiple myeloma (SMM) ([place])     06/25/2016 Imaging    Bone survey- 1. Stable exam. No significant lytic lesions identified. Exam unchanged from prior metastatic bone survey of 5:16 2017 .  2. Carotid, aortoiliac, peripheral vascular disease.        HISTORY OF PRESENT ILLNESS:  (From Kirby Crigler, PA-C's last note on 03/05/16)     INTERVAL HISTORY:  Mr. Esguerra 83 y.o. male returns for routine follow-up for smoldering multiple myeloma.    Patient states he feels well and has no complaints today. He denies any new bone pain. He denies any worsening fatigue. No chest pain, shortness of breath, abdominal pain, focal weakness.  REVIEW OF SYSTEMS:  Review of Systems  Constitutional: Negative.  Negative for chills, fatigue and fever.  HENT:  Negative.  Negative for lump/mass and nosebleeds.   Eyes: Negative.   Respiratory: Negative.  Negative for cough and shortness of breath.   Cardiovascular: Negative.  Negative for chest pain and leg swelling.  Gastrointestinal: Negative.  Negative for abdominal pain, blood in stool, constipation, diarrhea, nausea and vomiting.  Endocrine: Negative.   Genitourinary: Negative.  Negative for dysuria and hematuria.   Musculoskeletal: Negative.  Negative for arthralgias.  Skin: Negative.  Negative for rash.  Neurological: Negative.  Negative for dizziness and headaches.  Hematological: Negative.  Negative for adenopathy. Does not bruise/bleed easily.  Psychiatric/Behavioral: Negative.  Negative for depression and sleep disturbance. The  patient is not nervous/anxious.      PAST MEDICAL/SURGICAL HISTORY:  Past Medical History:  Diagnosis Date  . Diabetes mellitus ([place])   . Hypercholesteremia   . Hypertension   . Melanoma ([place])    lower back  . Other pancytopenia ([place]) 02/25/2014  . Smoldering multiple myeloma (SMM) ([place]) 02/25/2014   Past Surgical History:  Procedure Laterality Date  . AMPUTATION FINGER / THUMB Left    forefinger  . BONE MARROW ASPIRATION Left 07/19/14  . BONE MARROW BIOPSY Left 07/19/14  . CATARACT EXTRACTION Bilateral   . CORNEAL TRANSPLANT Right   . MELANOMA EXCISION Left    left flank area 1980s  . TONSILLECTOMY       SOCIAL HISTORY:  Social History   Social History  . Marital status: Widowed    Spouse name: N/A  . Number of children: N/A  . Years of education: N/A   Occupational History  . Not on file.   Social History Main Topics  . Smoking status: Former Smoker    Types: Cigarettes  . Smokeless tobacco: Former Systems developer    Types: Chew  . Alcohol use No  . Drug use: No  . Sexual activity: Not on file   Other Topics Concern  . Not on file   Social History Narrative  . No narrative on file    FAMILY HISTORY:  Family History  Problem Relation Age of Onset  . Cancer Mother   . Stroke Father     CURRENT MEDICATIONS:  Outpatient Encounter Prescriptions as of 11/02/2016  Medication Sig  . amLODipine (NORVASC) 10 MG tablet Take 10 mg by  mouth daily.   Marland Kitchen aspirin EC 325 MG tablet Take 325 mg by mouth daily.  Marland Kitchen doxazosin (CARDURA) 4 MG tablet   . DOXAZOSIN MESYLATE PO Take 4 mg by mouth daily.  . finasteride (PROSCAR) 5 MG tablet Take 5 mg by mouth daily.  Marland Kitchen glipiZIDE (GLUCOTROL XL) 5 MG 24 hr tablet Take 5 mg by mouth daily with breakfast.   . metoprolol (LOPRESSOR) 100 MG tablet Take 100 mg by mouth 2 (two) times daily.  . Multiple Vitamins-Minerals (CENTRUM SILVER PO) Take 1 capsule by mouth daily.  . potassium chloride SA (K-DUR,KLOR-CON) 20 MEQ tablet Take 20 mEq by  mouth daily.  . pravastatin (PRAVACHOL) 40 MG tablet Take 40 mg by mouth daily.  . timolol (TIMOPTIC) 0.5 % ophthalmic solution Place 1 drop into the right eye daily.   . valsartan-hydrochlorothiazide (DIOVAN-HCT) 160-12.5 MG per tablet Take 1 tablet by mouth daily.  . vitamin C (ASCORBIC ACID) 500 MG tablet Take 500 mg by mouth daily.   No facility-administered encounter medications on file as of 11/02/2016.     ALLERGIES:  No Known Allergies   PHYSICAL EXAM:  ECOG Performance status: 0 - Asymptomatic   Vitals:   11/02/16 1020  BP: (!) 182/68  Pulse: (!) 53  Resp: 20  Temp: 97.6 F (36.4 C)  SpO2: 100%   Filed Weights   11/02/16 1020  Weight: 180 lb 9.6 oz (81.9 kg)    Physical Exam  Constitutional: He is oriented to person, place, and time and well-developed, well-nourished, and in no distress.  HENT:  Head: Normocephalic.  Mouth/Throat: Oropharynx is clear and moist. No oropharyngeal exudate.  Eyes: Pupils are equal, round, and reactive to light. Conjunctivae are normal. No scleral icterus.  (R) eye cloudy/right eye deformity  Neck: Normal range of motion. Neck supple.  Cardiovascular: Normal rate and regular rhythm.   Pulmonary/Chest: Effort normal and breath sounds normal. No respiratory distress. He has no wheezes. He has no rales.  Abdominal: Soft. Bowel sounds are normal. There is no tenderness. There is no rebound and no guarding.  Musculoskeletal: Normal range of motion. He exhibits no edema.  (L) index finger absent   Lymphadenopathy:    He has no cervical adenopathy.  Neurological: He is alert and oriented to person, place, and time. No cranial nerve deficit. Gait normal.  Skin: Skin is warm and dry. No rash noted.  Psychiatric: Mood, memory, affect and judgment normal.  Nursing note and vitals reviewed.    LABORATORY DATA:  I have reviewed the labs as listed.  CBC    Component Value Date/Time   WBC 3.1 (L) 10/26/2016 0921   RBC 3.85 (L) 10/26/2016  0921   HGB 12.4 (L) 10/26/2016 0921   HCT 35.8 (L) 10/26/2016 0921   PLT 167 10/26/2016 0921   MCV 93.0 10/26/2016 0921   MCH 32.2 10/26/2016 0921   MCHC 34.6 10/26/2016 0921   RDW 13.1 10/26/2016 0921   LYMPHSABS 1.0 10/26/2016 0921   MONOABS 0.3 10/26/2016 0921   EOSABS 0.2 10/26/2016 0921   BASOSABS 0.0 10/26/2016 0921   CMP Latest Ref Rng & Units 10/26/2016 06/26/2016 04/17/2016  Glucose 65 - 99 mg/dL 147(H) 151(H) 136(H)  BUN 6 - 20 mg/dL 20 22(H) 22(H)  Creatinine 0.61 - 1.24 mg/dL 1.10 1.21 1.32(H)  Sodium 135 - 145 mmol/L 138 137 137  Potassium 3.5 - 5.1 mmol/L 3.7 3.9 3.5  Chloride 101 - 111 mmol/L 99(L) 101 98(L)  CO2 22 - 32 mmol/L  31 29 32  Calcium 8.9 - 10.3 mg/dL 9.3 9.5 9.2  Total Protein 6.5 - 8.1 g/dL 8.2(H) 8.5(H) -  Total Bilirubin 0.3 - 1.2 mg/dL 0.7 0.9 -  Alkaline Phos 38 - 126 U/L 63 59 -  AST 15 - 41 U/L 21 25 -  ALT 17 - 63 U/L 20 21 -   Results for MACHAEL, RAINE (MRN [id number]) as of 11/02/2016 10:55  Ref. Range 10/26/2016 09:21  IgG (Immunoglobin G), Serum Latest Ref Range: 700 - 1,600 mg/dL 2,287 (H)  IgA Latest Ref Range: 61 - 437 mg/dL 68  IgM (Immunoglobulin M), Srm Latest Ref Range: 15 - 143 mg/dL 30  Kappa free light chain Latest Ref Range: 3.3 - 19.4 mg/L 18.0  Lamda free light chains Latest Ref Range: 5.7 - 26.3 mg/L 13.9  Kappa, lamda light chain ratio Latest Ref Range: 0.26 - 1.65  1.29   7d ago (10/26/16) 7moago (06/26/16) 467mogo (06/26/16) 47m44moo (03/05/16) 47mo37mo (03/05/16)      Total Protein ELP 6.0 - 8.5 g/dL 7.8  7.9  7.9  8.3  8.8     Albumin ELP 2.9 - 4.4 g/dL 3.6   4.3   4.5     Alpha-1-Globulin 0.0 - 0.4 g/dL 0.2   0.2   0.2    Alpha-2-Globulin 0.4 - 1.0 g/dL 0.8   0.6   0.7    Beta Globulin 0.7 - 1.3 g/dL 0.9   0.8   1.0    Gamma Globulin 0.4 - 1.8 g/dL 2.3    2.0    2.4     M-Spike, % Not Observed g/dL 1.8    1.7    1.9     SPE Interp.  Comment   CommentCM   CommentCM      PENDING LABS:    DIAGNOSTIC IMAGING:  *The  following radiologic images and reports have been reviewed independently and agree with below findings.  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 Bone marrow biopsy in 07/2014 with 10% plasma cells and normal cytogenetics. PET scan in 08/2014 demonstrated no lytic lesions. IgG at diagnosis ~2400. No active treatment.  -Recent myeloma skeletal survey on 06/24/16 negative for lytic lesions. Continue annual bone surveys.  -Myeloma labs are stable and reviewed with patient today.  Remains with elevated IgA. M-spike stable at 1.8%; kappa/lambda light chains and ratio are also stable and WNL.  -Return to cancer center in 4 months with labs. We will have him come about 1 week before scheduled follow-up visit so that myeloma labs are available to review together at office visit.   Dispo:  -Return to cancer center in 4 months with labs.    All questions were answered to patient's stated satisfaction. Encouraged patient to call with any new concerns or questions before his next visit to the cancer center and we can certain see him sooner, if needed.    Orders placed this encounter:  Orders Placed This Encounter  Procedures  . CBC with Differential  . Comprehensive metabolic panel  . Multiple Myeloma Panel (SPEP&IFE w/QIG)  . Kappa/lambda light chains  . Beta 2 microglobuline, serum     LouiTwana First

## 2017-01-13 DIAGNOSIS — E1129 Type 2 diabetes mellitus with other diabetic kidney complication: Secondary | ICD-10-CM | POA: Diagnosis not present

## 2017-01-13 DIAGNOSIS — E1169 Type 2 diabetes mellitus with other specified complication: Secondary | ICD-10-CM | POA: Diagnosis not present

## 2017-01-19 DIAGNOSIS — C9000 Multiple myeloma not having achieved remission: Secondary | ICD-10-CM | POA: Diagnosis not present

## 2017-01-19 DIAGNOSIS — N183 Chronic kidney disease, stage 3 (moderate): Secondary | ICD-10-CM | POA: Diagnosis not present

## 2017-01-19 DIAGNOSIS — I10 Essential (primary) hypertension: Secondary | ICD-10-CM | POA: Diagnosis not present

## 2017-01-19 DIAGNOSIS — E1129 Type 2 diabetes mellitus with other diabetic kidney complication: Secondary | ICD-10-CM | POA: Diagnosis not present

## 2017-03-04 ENCOUNTER — Inpatient Hospital Stay (HOSPITAL_COMMUNITY): Payer: Medicare Other | Attending: Oncology

## 2017-03-04 DIAGNOSIS — D472 Monoclonal gammopathy: Secondary | ICD-10-CM

## 2017-03-04 DIAGNOSIS — C9000 Multiple myeloma not having achieved remission: Secondary | ICD-10-CM | POA: Insufficient documentation

## 2017-03-04 LAB — CBC WITH DIFFERENTIAL/PLATELET
BASOS ABS: 0 10*3/uL (ref 0.0–0.1)
Basophils Relative: 0 %
EOS PCT: 3 %
Eosinophils Absolute: 0.1 10*3/uL (ref 0.0–0.7)
HCT: 38.3 % — ABNORMAL LOW (ref 39.0–52.0)
Hemoglobin: 12.8 g/dL — ABNORMAL LOW (ref 13.0–17.0)
Lymphocytes Relative: 38 %
Lymphs Abs: 1.3 10*3/uL (ref 0.7–4.0)
MCH: 31.3 pg (ref 26.0–34.0)
MCHC: 33.4 g/dL (ref 30.0–36.0)
MCV: 93.6 fL (ref 78.0–100.0)
MONO ABS: 0.3 10*3/uL (ref 0.1–1.0)
Monocytes Relative: 10 %
NEUTROS ABS: 1.6 10*3/uL — AB (ref 1.7–7.7)
Neutrophils Relative %: 49 %
PLATELETS: 178 10*3/uL (ref 150–400)
RBC: 4.09 MIL/uL — ABNORMAL LOW (ref 4.22–5.81)
RDW: 13 % (ref 11.5–15.5)
WBC: 3.3 10*3/uL — ABNORMAL LOW (ref 4.0–10.5)

## 2017-03-04 LAB — COMPREHENSIVE METABOLIC PANEL
ALBUMIN: 4.2 g/dL (ref 3.5–5.0)
ALT: 20 U/L (ref 17–63)
AST: 23 U/L (ref 15–41)
Alkaline Phosphatase: 60 U/L (ref 38–126)
Anion gap: 10 (ref 5–15)
BUN: 20 mg/dL (ref 6–20)
CHLORIDE: 98 mmol/L — AB (ref 101–111)
CO2: 29 mmol/L (ref 22–32)
Calcium: 9.1 mg/dL (ref 8.9–10.3)
Creatinine, Ser: 1.11 mg/dL (ref 0.61–1.24)
GFR calc Af Amer: >60 mL/min (ref >60)
GFR calc non Af Amer: 59 mL/min — ABNORMAL LOW (ref >60)
GLUCOSE: 164 mg/dL — AB (ref 65–99)
POTASSIUM: 3.7 mmol/L (ref 3.5–5.1)
Sodium: 137 mmol/L (ref 135–145)
Total Bilirubin: 0.6 mg/dL (ref 0.3–1.2)
Total Protein: 8.3 g/dL — ABNORMAL HIGH (ref 6.5–8.1)

## 2017-03-05 LAB — KAPPA/LAMBDA LIGHT CHAINS
Kappa free light chain: 19.7 mg/L — ABNORMAL HIGH (ref 3.3–19.4)
Kappa, lambda light chain ratio: 1.22 (ref 0.26–1.65)
Lambda free light chains: 16.2 mg/L (ref 5.7–26.3)

## 2017-03-05 LAB — BETA 2 MICROGLOBULIN, SERUM: BETA 2 MICROGLOBULIN: 2.4 mg/L (ref 0.6–2.4)

## 2017-03-08 LAB — MULTIPLE MYELOMA PANEL, SERUM
ALPHA 1: 0.2 g/dL (ref 0.0–0.4)
Albumin SerPl Elph-Mcnc: 4 g/dL (ref 2.9–4.4)
Albumin/Glob SerPl: 1 (ref 0.7–1.7)
Alpha2 Glob SerPl Elph-Mcnc: 0.7 g/dL (ref 0.4–1.0)
B-Globulin SerPl Elph-Mcnc: 0.8 g/dL (ref 0.7–1.3)
GAMMA GLOB SERPL ELPH-MCNC: 2.3 g/dL — AB (ref 0.4–1.8)
GLOBULIN, TOTAL: 4.1 g/dL — AB (ref 2.2–3.9)
IGA: 65 mg/dL (ref 61–437)
IgG (Immunoglobin G), Serum: 2493 mg/dL — ABNORMAL HIGH (ref 700–1600)
IgM (Immunoglobulin M), Srm: 32 mg/dL (ref 15–143)
M PROTEIN SERPL ELPH-MCNC: 1.8 g/dL — AB
TOTAL PROTEIN ELP: 8.1 g/dL (ref 6.0–8.5)

## 2017-03-11 ENCOUNTER — Inpatient Hospital Stay (HOSPITAL_COMMUNITY): Payer: Medicare Other | Attending: Internal Medicine | Admitting: Adult Health

## 2017-03-11 ENCOUNTER — Encounter (HOSPITAL_COMMUNITY): Payer: Self-pay | Admitting: Adult Health

## 2017-03-11 VITALS — BP 161/60 | HR 52 | Temp 98.5°F | Resp 18 | Wt 178.0 lb

## 2017-03-11 DIAGNOSIS — I10 Essential (primary) hypertension: Secondary | ICD-10-CM | POA: Diagnosis not present

## 2017-03-11 DIAGNOSIS — D472 Monoclonal gammopathy: Secondary | ICD-10-CM

## 2017-03-11 DIAGNOSIS — Z87891 Personal history of nicotine dependence: Secondary | ICD-10-CM | POA: Insufficient documentation

## 2017-03-11 DIAGNOSIS — Z7984 Long term (current) use of oral hypoglycemic drugs: Secondary | ICD-10-CM | POA: Diagnosis not present

## 2017-03-11 DIAGNOSIS — C9001 Multiple myeloma in remission: Secondary | ICD-10-CM | POA: Diagnosis not present

## 2017-03-11 DIAGNOSIS — Z7982 Long term (current) use of aspirin: Secondary | ICD-10-CM | POA: Insufficient documentation

## 2017-03-11 DIAGNOSIS — Z85820 Personal history of malignant melanoma of skin: Secondary | ICD-10-CM | POA: Diagnosis not present

## 2017-03-11 DIAGNOSIS — M130 Polyarthritis, unspecified: Secondary | ICD-10-CM | POA: Diagnosis not present

## 2017-03-11 DIAGNOSIS — E7800 Pure hypercholesterolemia, unspecified: Secondary | ICD-10-CM | POA: Diagnosis not present

## 2017-03-11 DIAGNOSIS — D61818 Other pancytopenia: Secondary | ICD-10-CM | POA: Diagnosis not present

## 2017-03-11 DIAGNOSIS — R5383 Other fatigue: Secondary | ICD-10-CM | POA: Diagnosis not present

## 2017-03-11 DIAGNOSIS — E1151 Type 2 diabetes mellitus with diabetic peripheral angiopathy without gangrene: Secondary | ICD-10-CM | POA: Diagnosis not present

## 2017-03-11 DIAGNOSIS — Z79899 Other long term (current) drug therapy: Secondary | ICD-10-CM | POA: Diagnosis not present

## 2017-03-11 DIAGNOSIS — Z809 Family history of malignant neoplasm, unspecified: Secondary | ICD-10-CM | POA: Diagnosis not present

## 2017-03-11 DIAGNOSIS — C9000 Multiple myeloma not having achieved remission: Secondary | ICD-10-CM

## 2017-03-11 NOTE — Progress Notes (Signed)
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 Stable exam. No significant lytic lesions identified. Exam unchanged from prior metastatic bone survey of 5:16 2017 .  2. Carotid, aortoiliac, peripheral vascular disease.        HISTORY OF PRESENT ILLNESS:  (From Maurice Crigler, PA-C's last note on 03/05/16)      INTERVAL HISTORY:  Maurice Cooper 84 y.o. male returns for routine follow-up for smoldering multiple myeloma.    Here today unaccompanied.   Overall, he tells me he has been feeling well. Appetite 100%; energy levels 75%. He feels tired at times.  Denies any focal bone pain. He has some occasional shoulder and knee discomfort at times, which is attributes to chronic arthritis; he doesn't take anything for this discomfort.    He continues to remain active by exercising. He remains independent in his ADLs and house chores.  Denies any fever/chills, cough, shortness of breath, abdominal pain, changes in bowel/bladder, bleeding episodes, dizziness, or headaches.   He wants to know if he can drink Boost to help improve his energy levels; encouraged him to try these nutritional supplements to see if they will offer him some benefit.   Otherwise, he is largely without complaints. Continues to see his PCP, Dr. Luan Pulling, regularly (generally every 3 months per patient).   For fun, he enjoys sports especially the [place] and [place].     REVIEW OF SYSTEMS:  Review of Systems  Constitutional: Positive for fatigue. Negative for  chills and fever.  HENT:  Negative.   Eyes: Negative.   Respiratory: Negative.   Cardiovascular: Negative.   Gastrointestinal: Negative.   Endocrine: Negative.   Genitourinary: Negative.    Musculoskeletal: Negative.   Neurological: Negative.   Hematological: Negative.   Psychiatric/Behavioral: Negative.      PAST MEDICAL/SURGICAL HISTORY:  Past Medical History:  Diagnosis Date  . Diabetes mellitus ([place])   . Hypercholesteremia   . Hypertension   . Melanoma ([place])    lower back  . Other pancytopenia ([place]) 02/25/2014  . 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 AMPUTATION FINGER / THUMB Left    forefinger  . BONE MARROW ASPIRATION Left 07/19/14  . BONE MARROW BIOPSY Left 07/19/14  . CATARACT EXTRACTION Bilateral   . CORNEAL TRANSPLANT Right   . MELANOMA EXCISION Left    left flank area 1980s  . TONSILLECTOMY       SOCIAL HISTORY:  Social History   Socioeconomic History  . Marital status: Widowed    Spouse name: Not on file  . Number of children: Not on file  . Years of education: Not on file  . Highest education level: Not on file  Social Needs  . Financial resource strain: Not on file  . Food insecurity - worry: Not on file  . Food insecurity - inability: Not on file  . Transportation needs - medical: Not on file  . Transportation needs - non-medical: Not on file  Occupational  History  . Not on file  Tobacco Use  . Smoking status: Former Smoker    Types: Cigarettes  . Smokeless tobacco: Former Systems developer    Types: Chew  Substance and Sexual Activity  . Alcohol use: No  . Drug use: No  . Sexual activity: Not on file  Other Topics Concern  . Not on file  Social History Narrative  . Not on file    FAMILY HISTORY:  Family History  Problem Relation Age of Onset  . Cancer Mother   . Stroke Father     CURRENT MEDICATIONS:  Outpatient Encounter Medications as of 03/11/2017  Medication Sig  .  amLODipine (NORVASC) 10 MG tablet Take 10 mg by mouth daily.   Marland Kitchen aspirin EC 325 MG tablet Take 325 mg by mouth daily.  Marland Kitchen doxazosin (CARDURA) 4 MG tablet   . DOXAZOSIN MESYLATE PO Take 4 mg by mouth daily.  . finasteride (PROSCAR) 5 MG tablet Take 5 mg by mouth daily.  Marland Kitchen glipiZIDE (GLUCOTROL XL) 5 MG 24 hr tablet Take 5 mg by mouth daily with breakfast.   . metoprolol (LOPRESSOR) 100 MG tablet Take 100 mg by mouth 2 (two) times daily.  . Multiple Vitamins-Minerals (CENTRUM SILVER PO) Take 1 capsule by mouth daily.  . potassium chloride SA (K-DUR,KLOR-CON) 20 MEQ tablet Take 20 mEq by mouth daily.  . pravastatin (PRAVACHOL) 40 MG tablet Take 40 mg by mouth daily.  . timolol (TIMOPTIC) 0.5 % ophthalmic solution Place 1 drop into the right eye daily.   . valsartan-hydrochlorothiazide (DIOVAN-HCT) 160-12.5 MG per tablet Take 1 tablet by mouth daily.  . vitamin C (ASCORBIC ACID) 500 MG tablet Take 500 mg by mouth daily.   No facility-administered encounter medications on file as of 03/11/2017.     ALLERGIES:  No Known Allergies   PHYSICAL EXAM:  ECOG Performance status: 0 - 1- Mildly symptomatic; remains independent   Vitals:   03/11/17 1111  BP: (!) 161/60  Pulse: (!) 52  Resp: 18  Temp: 98.5 F (36.9 C)  SpO2: 100%   Filed Weights   03/11/17 1111  Weight: 178 lb (80.7 kg)    Physical Exam  Constitutional: He is oriented to person, place, and time and well-developed, well-nourished, and in no distress.  HENT:  Head: Normocephalic.  Mouth/Throat: Oropharynx is clear and moist. No oropharyngeal exudate.  Eyes: No scleral icterus.  (R) eye cloudy/right eye deformity  Neck: Normal range of motion. Neck supple.  Cardiovascular: Regular rhythm.  Bradycardic   Pulmonary/Chest: Effort normal and breath sounds normal. No respiratory distress. He has no wheezes.  Abdominal: Soft. Bowel sounds are normal. There is no tenderness.  Musculoskeletal: Normal range of motion. He exhibits no  edema.  (L) index finger absent   Lymphadenopathy:    He has no cervical adenopathy.       Right: No supraclavicular adenopathy present.       Left: No supraclavicular adenopathy present.  Neurological: He is alert and oriented to person, place, and time. No cranial nerve deficit. Gait normal.  Skin: Skin is warm and dry. No rash noted.  Psychiatric: Mood, memory, affect and judgment normal.     LABORATORY DATA:  I have reviewed the labs as listed.  CBC    Component Value Date/Time   WBC 3.3 (L) 03/04/2017 1040   RBC 4.09 (L) 03/04/2017 1040   HGB 12.8 (L) 03/04/2017 1040   HCT 38.3 (L) 03/04/2017 1040   PLT 178 03/04/2017 1040  MCV 93.6 03/04/2017 1040   MCH 31.3 03/04/2017 1040   MCHC 33.4 03/04/2017 1040   RDW 13.0 03/04/2017 1040   LYMPHSABS 1.3 03/04/2017 1040   MONOABS 0.3 03/04/2017 1040   EOSABS 0.1 03/04/2017 1040   BASOSABS 0.0 03/04/2017 1040   CMP Latest Ref Rng & Units 03/04/2017 10/26/2016 06/26/2016  Glucose 65 - 99 mg/dL 164(H) 147(H) 151(H)  BUN 6 - 20 mg/dL 20 20 22(H)  Creatinine 0.61 - 1.24 mg/dL 1.11 1.10 1.21  Sodium 135 - 145 mmol/L 137 138 137  Potassium 3.5 - 5.1 mmol/L 3.7 3.7 3.9  Chloride 101 - 111 mmol/L 98(L) 99(L) 101  CO2 22 - 32 mmol/L _0 Calcium 8.9 - 10.3 mg/dL 9.1 9.3 9.5  Total Protein 6.5 - 8.1 g/dL 8.3(H) 8.2(H) 8.5(H)  Total Bilirubin 0.3 - 1.2 mg/dL 0.6 0.7 0.9  Alkaline Phos 38 - 126 U/L 60 63 59  AST 15 - 41 U/L _1 ALT 17 - 63 U/L _2 Results for JAILIN, MOOMAW (MRN [id number])   Ref. Range 03/04/2017 10:40  Beta-2 Microglobulin Latest Ref Range: 0.6 - 2.4 mg/L 2.4  Total Protein ELP Latest Ref Range: 6.0 - 8.5 g/dL 8.1  Albumin SerPl Elph-Mcnc Latest Ref Range: 2.9 - 4.4 g/dL 4.0  Albumin/Glob SerPl Latest Ref Range: 0.7 - 1.7  1.0  Alpha2 Glob SerPl Elph-Mcnc Latest Ref Range: 0.4 - 1.0 g/dL 0.7  Alpha 1 Latest Ref Range: 0.0 - 0.4 g/dL 0.2  Gamma Glob SerPl Elph-Mcnc Latest Ref Range: 0.4 - 1.8 g/dL 2.3  (H)  M Protein SerPl Elph-Mcnc Latest Ref Range: Not Observed g/dL 1.8 (H)  IFE 1 Unknown Comment  Globulin, Total Latest Ref Range: 2.2 - 3.9 g/dL 4.1 (H)  B-Globulin SerPl Elph-Mcnc Latest Ref Range: 0.7 - 1.3 g/dL 0.8  IgG (Immunoglobin G), Serum Latest Ref Range: 700 - 1,600 mg/dL 2,493 (H)  IgA Latest Ref Range: 61 - 437 mg/dL 65  IgM (Immunoglobulin M), Srm Latest Ref Range: 15 - 143 mg/dL 32  Kappa free light chain Latest Ref Range: 3.3 - 19.4 mg/L 19.7 (H)  Lamda free light chains Latest Ref Range: 5.7 - 26.3 mg/L 16.2  Kappa, lamda light chain ratio Latest Ref Range: 0.26 - 1.65  1.22        PENDING LABS:    DIAGNOSTIC IMAGING:  *The following radiologic images and reports have been reviewed independently and agree with below findings.  Myeloma bone survey: 06/24/16    PATHOLOGY:  Bone marrow biopsy: 07/19/14   Cytogenetics: 07/19/14        ASSESSMENT & PLAN:   Smoldering multiple myeloma:  -Diagnosed in 2016. Bone marrow biopsy in 07/2014 with 10% plasma cells and normal cytogenetics. PET scan in 08/2014 demonstrated no lytic lesions. IgG at diagnosis ~2400. No active treatment.  -Labs from 03/02/17 reviewed with patient in great detail; he was also provided a paper copy of blood work as well.  M-spike remains elevated at 1.8%, but is stable. Trend reviewed; M-spike has been in the 1.6-1.9% range since diagnosis. IFE shows IgG monoclonal protein with kappa light chain specificity.  Kappa/lambda light chain ratio normal.  -Last skeletal survey in 06/2016 negative for lytic lesions.  -We reviewed "CRAB" symptoms today. Calcium is normal; renal function is essentially normal. Anemia is stable with Hgb 12.8 g/dL. He does have generalized arthritis symptoms, but denies any focal bone pain.  -Will repeat bone survey before next visit  to ensure stability; orders placed today.  -Return to cancer center in 4 months for follow-up with labs 1 week prior so results will be  available for review at time of office visit. He agrees with this plan.       Dispo:  -Skeletal survey in 4 months (on same day as labs 1 week prior to office visit so results will be available for review at office visit).  -Return to cancer center in 4 months with labs 1 week prior to follow-up visit.     All questions were answered to patient's stated satisfaction. Encouraged patient to call with any new concerns or questions before his next visit to the cancer center and we can certain see him sooner, if needed.      Orders placed this encounter:  Orders Placed This Encounter  Procedures  . DG Bone Survey Met  . CBC with Differential/Platelet  . Comprehensive metabolic panel  . Immunofixation electrophoresis  . IgG, IgA, IgM  . Kappa/lambda light chains  . Protein electrophoresis, serum      Mike Craze, NP [place] [phone]

## 2017-03-14 ENCOUNTER — Encounter (HOSPITAL_COMMUNITY): Payer: Self-pay | Admitting: Adult Health

## 2017-03-18 DIAGNOSIS — D225 Melanocytic nevi of trunk: Secondary | ICD-10-CM | POA: Diagnosis not present

## 2017-03-18 DIAGNOSIS — L570 Actinic keratosis: Secondary | ICD-10-CM | POA: Diagnosis not present

## 2017-03-18 DIAGNOSIS — Z08 Encounter for follow-up examination after completed treatment for malignant neoplasm: Secondary | ICD-10-CM | POA: Diagnosis not present

## 2017-03-18 DIAGNOSIS — Z1283 Encounter for screening for malignant neoplasm of skin: Secondary | ICD-10-CM | POA: Diagnosis not present

## 2017-03-18 DIAGNOSIS — D0439 Carcinoma in situ of skin of other parts of face: Secondary | ICD-10-CM | POA: Diagnosis not present

## 2017-03-18 DIAGNOSIS — Z85820 Personal history of malignant melanoma of skin: Secondary | ICD-10-CM | POA: Diagnosis not present

## 2017-04-14 DIAGNOSIS — E1129 Type 2 diabetes mellitus with other diabetic kidney complication: Secondary | ICD-10-CM | POA: Diagnosis not present

## 2017-04-14 DIAGNOSIS — N183 Chronic kidney disease, stage 3 (moderate): Secondary | ICD-10-CM | POA: Diagnosis not present

## 2017-04-14 DIAGNOSIS — N401 Enlarged prostate with lower urinary tract symptoms: Secondary | ICD-10-CM | POA: Diagnosis not present

## 2017-04-14 DIAGNOSIS — C9000 Multiple myeloma not having achieved remission: Secondary | ICD-10-CM | POA: Diagnosis not present

## 2017-04-20 DIAGNOSIS — C9000 Multiple myeloma not having achieved remission: Secondary | ICD-10-CM | POA: Diagnosis not present

## 2017-04-20 DIAGNOSIS — E1129 Type 2 diabetes mellitus with other diabetic kidney complication: Secondary | ICD-10-CM | POA: Diagnosis not present

## 2017-04-20 DIAGNOSIS — I129 Hypertensive chronic kidney disease with stage 1 through stage 4 chronic kidney disease, or unspecified chronic kidney disease: Secondary | ICD-10-CM | POA: Diagnosis not present

## 2017-04-20 DIAGNOSIS — N182 Chronic kidney disease, stage 2 (mild): Secondary | ICD-10-CM | POA: Diagnosis not present

## 2017-06-17 DIAGNOSIS — X32XXXD Exposure to sunlight, subsequent encounter: Secondary | ICD-10-CM | POA: Diagnosis not present

## 2017-06-17 DIAGNOSIS — L570 Actinic keratosis: Secondary | ICD-10-CM | POA: Diagnosis not present

## 2017-06-17 DIAGNOSIS — Z85828 Personal history of other malignant neoplasm of skin: Secondary | ICD-10-CM | POA: Diagnosis not present

## 2017-06-17 DIAGNOSIS — Z08 Encounter for follow-up examination after completed treatment for malignant neoplasm: Secondary | ICD-10-CM | POA: Diagnosis not present

## 2017-07-01 ENCOUNTER — Inpatient Hospital Stay (HOSPITAL_COMMUNITY): Payer: Medicare Other | Attending: Hematology

## 2017-07-01 ENCOUNTER — Ambulatory Visit (HOSPITAL_COMMUNITY)
Admission: RE | Admit: 2017-07-01 | Discharge: 2017-07-01 | Disposition: A | Discharge status: Home / Self Care | Payer: Medicare Other | Source: Ambulatory Visit | Attending: Adult Health | Admitting: Adult Health

## 2017-07-01 DIAGNOSIS — C9000 Multiple myeloma not having achieved remission: Secondary | ICD-10-CM | POA: Diagnosis not present

## 2017-07-01 DIAGNOSIS — D472 Monoclonal gammopathy: Secondary | ICD-10-CM

## 2017-07-01 LAB — COMPREHENSIVE METABOLIC PANEL
ALK PHOS: 67 U/L (ref 38–126)
ALT: 23 U/L (ref 17–63)
ANION GAP: 8 (ref 5–15)
AST: 26 U/L (ref 15–41)
Albumin: 4.2 g/dL (ref 3.5–5.0)
BILIRUBIN TOTAL: 0.9 mg/dL (ref 0.3–1.2)
BUN: 21 mg/dL — ABNORMAL HIGH (ref 6–20)
CALCIUM: 8.9 mg/dL (ref 8.9–10.3)
CO2: 30 mmol/L (ref 22–32)
Chloride: 97 mmol/L — ABNORMAL LOW (ref 101–111)
Creatinine, Ser: 1.25 mg/dL — ABNORMAL HIGH (ref 0.61–1.24)
GFR, EST AFRICAN AMERICAN: 60 mL/min — AB (ref >60)
GFR, EST NON AFRICAN AMERICAN: 51 mL/min — AB (ref >60)
Glucose, Bld: 174 mg/dL — ABNORMAL HIGH (ref 65–99)
POTASSIUM: 3.7 mmol/L (ref 3.5–5.1)
Sodium: 135 mmol/L (ref 135–145)
TOTAL PROTEIN: 8.5 g/dL — AB (ref 6.5–8.1)

## 2017-07-01 LAB — CBC WITH DIFFERENTIAL/PLATELET
Basophils Absolute: 0 10*3/uL (ref 0.0–0.1)
Basophils Relative: 0 %
Eosinophils Absolute: 0.2 10*3/uL (ref 0.0–0.7)
Eosinophils Relative: 5 %
HEMATOCRIT: 36.5 % — AB (ref 39.0–52.0)
HEMOGLOBIN: 12.4 g/dL — AB (ref 13.0–17.0)
LYMPHS ABS: 1.3 10*3/uL (ref 0.7–4.0)
Lymphocytes Relative: 39 %
MCH: 31.6 pg (ref 26.0–34.0)
MCHC: 34 g/dL (ref 30.0–36.0)
MCV: 92.9 fL (ref 78.0–100.0)
MONO ABS: 0.3 10*3/uL (ref 0.1–1.0)
MONOS PCT: 8 %
NEUTROS ABS: 1.6 10*3/uL — AB (ref 1.7–7.7)
NEUTROS PCT: 48 %
Platelets: 188 10*3/uL (ref 150–400)
RBC: 3.93 MIL/uL — ABNORMAL LOW (ref 4.22–5.81)
RDW: 12.8 % (ref 11.5–15.5)
WBC: 3.2 10*3/uL — ABNORMAL LOW (ref 4.0–10.5)

## 2017-07-02 LAB — IGG, IGA, IGM
IGA: 59 mg/dL — AB (ref 61–437)
IGM (IMMUNOGLOBULIN M), SRM: 30 mg/dL (ref 15–143)
IgG (Immunoglobin G), Serum: 2390 mg/dL — ABNORMAL HIGH (ref 700–1600)

## 2017-07-02 LAB — KAPPA/LAMBDA LIGHT CHAINS
KAPPA FREE LGHT CHN: 22.2 mg/L — AB (ref 3.3–19.4)
KAPPA, LAMDA LIGHT CHAIN RATIO: 1.82 — AB (ref 0.26–1.65)
LAMDA FREE LIGHT CHAINS: 12.2 mg/L (ref 5.7–26.3)

## 2017-07-04 LAB — PROTEIN ELECTROPHORESIS, SERUM
A/G RATIO SPE: 1.3 (ref 0.7–1.7)
ALBUMIN ELP: 4.5 g/dL — AB (ref 2.9–4.4)
ALPHA-1-GLOBULIN: 0.2 g/dL (ref 0.0–0.4)
Alpha-2-Globulin: 0.6 g/dL (ref 0.4–1.0)
Beta Globulin: 0.7 g/dL (ref 0.7–1.3)
GAMMA GLOBULIN: 1.9 g/dL — AB (ref 0.4–1.8)
GLOBULIN, TOTAL: 3.5 g/dL (ref 2.2–3.9)
M-Spike, %: 1.6 g/dL — ABNORMAL HIGH
TOTAL PROTEIN ELP: 8 g/dL (ref 6.0–8.5)

## 2017-07-04 LAB — IMMUNOFIXATION ELECTROPHORESIS
IGG (IMMUNOGLOBIN G), SERUM: 2246 mg/dL — AB (ref 700–1600)
IgA: 54 mg/dL — ABNORMAL LOW (ref 61–437)
IgM (Immunoglobulin M), Srm: 30 mg/dL (ref 15–143)
Total Protein ELP: 7.8 g/dL (ref 6.0–8.5)

## 2017-07-08 ENCOUNTER — Inpatient Hospital Stay (HOSPITAL_COMMUNITY): Payer: Medicare Other | Attending: Hematology | Admitting: Internal Medicine

## 2017-07-08 ENCOUNTER — Encounter (HOSPITAL_COMMUNITY): Payer: Self-pay | Admitting: Internal Medicine

## 2017-07-08 ENCOUNTER — Other Ambulatory Visit: Payer: Self-pay

## 2017-07-08 VITALS — BP 181/74 | HR 58 | Temp 98.8°F | Resp 16 | Wt 176.0 lb

## 2017-07-08 DIAGNOSIS — I739 Peripheral vascular disease, unspecified: Secondary | ICD-10-CM | POA: Diagnosis not present

## 2017-07-08 DIAGNOSIS — N400 Enlarged prostate without lower urinary tract symptoms: Secondary | ICD-10-CM

## 2017-07-08 DIAGNOSIS — Z85820 Personal history of malignant melanoma of skin: Secondary | ICD-10-CM

## 2017-07-08 DIAGNOSIS — Z79899 Other long term (current) drug therapy: Secondary | ICD-10-CM | POA: Insufficient documentation

## 2017-07-08 DIAGNOSIS — N189 Chronic kidney disease, unspecified: Secondary | ICD-10-CM | POA: Insufficient documentation

## 2017-07-08 DIAGNOSIS — Z7982 Long term (current) use of aspirin: Secondary | ICD-10-CM | POA: Diagnosis not present

## 2017-07-08 DIAGNOSIS — Z7984 Long term (current) use of oral hypoglycemic drugs: Secondary | ICD-10-CM | POA: Diagnosis not present

## 2017-07-08 DIAGNOSIS — E1151 Type 2 diabetes mellitus with diabetic peripheral angiopathy without gangrene: Secondary | ICD-10-CM | POA: Diagnosis not present

## 2017-07-08 DIAGNOSIS — C9000 Multiple myeloma not having achieved remission: Secondary | ICD-10-CM | POA: Diagnosis not present

## 2017-07-08 DIAGNOSIS — E7800 Pure hypercholesterolemia, unspecified: Secondary | ICD-10-CM | POA: Insufficient documentation

## 2017-07-08 DIAGNOSIS — I129 Hypertensive chronic kidney disease with stage 1 through stage 4 chronic kidney disease, or unspecified chronic kidney disease: Secondary | ICD-10-CM | POA: Diagnosis not present

## 2017-07-08 DIAGNOSIS — D472 Monoclonal gammopathy: Secondary | ICD-10-CM

## 2017-07-08 DIAGNOSIS — Z809 Family history of malignant neoplasm, unspecified: Secondary | ICD-10-CM | POA: Insufficient documentation

## 2017-07-08 DIAGNOSIS — Z87891 Personal history of nicotine dependence: Secondary | ICD-10-CM | POA: Diagnosis not present

## 2017-07-08 NOTE — Progress Notes (Signed)
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  Assessment and Plan: 1.  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  Pt was previously followed by Dr. Talbert Cage and was diagnosed in 2016. Bone marrow biopsy in 07/2014 with 10% plasma cells and normal cytogenetics.  PET scan in 08/2014 demonstrated no lytic lesions. IgG at diagnosis ~2400. Pt has remained on observation.   He is here today for follow-up to go over labs and xrays.  Labs done 07/01/2017 showed WBC 3.2, HB 12.4 plts 188,000.  Chemistries WNL with Cr 1.25.  K/L light chain ratio 1.82.  IGG stable at 2246.  Skeletal survey done 07/01/2017 showed no lytic lesions.  He will RTC in 6 months for repeat labs.    2.  HTN.  BP is 181/74.  Continue to follow-up with PCP.    3.  DM.  Continue to follow-up with PCP.    Current Status:  Pt is seen today for follow-up.  He is here to go over labs and xrays.     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 Stable exam. No significant lytic lesions identified. Exam unchanged from prior metastatic bone survey of 5:16 2017 .  2. Carotid, aortoiliac, peripheral vascular disease.        Problem List Patient Active Problem List   Diagnosis Date Noted  . 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 Benign prostatic hypertrophy [N40.0] 08/09/2013  . Diabetes mellitus ([place]) [E11.9] 08/09/2013  . Chronic kidney disease [N18.9] 08/09/2013  . Hypertension [I10] 08/09/2013  . 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 Diabetes mellitus ([place])   . Hypercholesteremia   . Hypertension   . Melanoma ([place])    lower back  . Other pancytopenia ([place]) 02/25/2014  . 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 AMPUTATION FINGER / THUMB Left    forefinger  . BONE MARROW ASPIRATION Left 07/19/14  . BONE MARROW BIOPSY Left 07/19/14  . CATARACT EXTRACTION Bilateral   . CORNEAL TRANSPLANT Right   . MELANOMA EXCISION Left    left flank area 1980s  . TONSILLECTOMY      Family History Family History  Problem Relation Age of Onset  . Cancer Mother   . Stroke Father      Social History  reports that he has quit smoking. His smoking use included cigarettes. He has quit using smokeless tobacco. His smokeless tobacco use included chew. He reports that he does not drink alcohol or use drugs.  Medications  Current Outpatient Medications:  .  amLODipine (NORVASC) 10 MG tablet, Take 10 mg by mouth daily. , Disp: , Rfl:  .  aspirin EC 325 MG tablet, Take 325 mg by mouth daily., Disp: , Rfl:  .  doxazosin (CARDURA) 4 MG tablet, , Disp: , Rfl: 1 .  DOXAZOSIN MESYLATE PO, Take 4 mg by mouth daily., Disp: , Rfl:  .  finasteride (PROSCAR) 5 MG tablet, Take 5 mg by mouth daily., Disp: , Rfl: 0 .  glipiZIDE (GLUCOTROL XL) 5 MG 24 hr tablet, Take 5 mg by mouth daily with breakfast. , Disp: , Rfl:  .  metoprolol (LOPRESSOR) 100 MG tablet, Take 100 mg by mouth 2 (two) times daily., Disp: , Rfl:  .  Multiple Vitamins-Minerals (CENTRUM SILVER PO), Take 1 capsule by mouth daily., Disp: , Rfl:  .  potassium chloride SA (K-DUR,KLOR-CON) 20 MEQ tablet, Take 20 mEq by mouth daily., Disp: , Rfl:  .  pravastatin (PRAVACHOL) 40 MG tablet, Take 40 mg by mouth daily., Disp: , Rfl:  .  timolol (TIMOPTIC) 0.5 % ophthalmic solution,  Place 1 drop into the right eye daily. , Disp: , Rfl: 1 .  valsartan-hydrochlorothiazide (DIOVAN-HCT) 160-12.5 MG per tablet, Take 1 tablet by mouth daily., Disp: , Rfl:  .  vitamin C (ASCORBIC ACID) 500 MG tablet, Take 500 mg by mouth daily., Disp: , Rfl:   Allergies Patient has no known allergies.  Review of Systems Review of Systems - Oncology ROS as per HPI otherwise 12 point ROS is negative.   Physical Exam  Vitals Wt Readings from Last 3 Encounters:  07/08/17 176 lb (79.8 kg)  03/11/17 178 lb (80.7 kg)  11/02/16 180 lb 9.6 oz (81.9 kg)   Temp Readings from Last 3 Encounters:  07/08/17 98.8 F (37.1 C) (Oral)  03/11/17 98.5 F (36.9 C) (Oral)  11/02/16 97.6 F (36.4 C) (Oral)   BP Readings from Last 3 Encounters:  07/08/17 (!) 181/74  03/11/17 (!) 161/60  11/02/16 (!) 182/68   Pulse Readings from Last 3 Encounters:  07/08/17 (!) 58  03/11/17 (!) 52  11/02/16 (!) 53   Constitutional: Well-developed, well-nourished, and in no distress.   HENT: Head: Normocephalic and atraumatic.  Mouth/Throat: No oropharyngeal exudate. Mucosa moist. Eyes: Pupils are equal, round, and reactive to light. Conjunctivae are normal. No scleral icterus.  Neck: Normal range of motion. Neck supple. No JVD present.  Cardiovascular: Normal rate, regular rhythm and normal heart sounds.  Exam reveals no gallop and no friction rub.   No murmur heard. Pulmonary/Chest: Effort normal and breath sounds normal. No respiratory distress. No wheezes.No rales.  Abdominal: Soft. Bowel sounds are normal. No distension. There is no tenderness. There is no guarding.  Musculoskeletal: No edema or tenderness.  Lymphadenopathy: No cervical, axillary or supraclavicular adenopathy.  Neurological: Alert and oriented to person, place, and time. No cranial nerve deficit.  Skin: Skin is warm and dry. No rash noted. No erythema. No pallor.  Psychiatric: Affect and judgment normal.   Labs No visits with results  within 3 Day(s) from this visit.  Latest known visit with results is:  Appointment on 07/01/2017  Component Date Value Ref Range Status  . WBC 07/01/2017 3.2* 4.0 - 10.5 K/uL Final  . RBC 07/01/2017 3.93* 4.22 - 5.81 MIL/uL Final  . Hemoglobin 07/01/2017 12.4* 13.0 - 17.0 g/dL Final  . HCT 07/01/2017 36.5* 39.0 - 52.0 % Final  . MCV 07/01/2017 92.9  78.0 - 100.0 fL Final  . MCH 07/01/2017 31.6  26.0 - 34.0 pg Final  . MCHC 07/01/2017 34.0  30.0 - 36.0 g/dL Final  . RDW 07/01/2017 12.8  11.5 - 15.5 % Final  . Platelets 07/01/2017 188  150 - 400 K/uL Final  . Neutrophils Relative % 07/01/2017 48  % Final  . Neutro Abs 07/01/2017 1.6* 1.7 - 7.7 K/uL Final  . Lymphocytes Relative  07/01/2017 39  % Final  . Lymphs Abs 07/01/2017 1.3  0.7 - 4.0 K/uL Final  . Monocytes Relative 07/01/2017 8  % Final  . Monocytes Absolute 07/01/2017 0.3  0.1 - 1.0 K/uL Final  . Eosinophils Relative 07/01/2017 5  % Final  . Eosinophils Absolute 07/01/2017 0.2  0.0 - 0.7 K/uL Final  . Basophils Relative 07/01/2017 0  % Final  . Basophils Absolute 07/01/2017 0.0  0.0 - 0.1 K/uL Final   Performed at [facility], [address] [place], [place] [address]  . Sodium 07/01/2017 135  135 - 145 mmol/L Final  . Potassium 07/01/2017 3.7  3.5 - 5.1 mmol/L Final  . Chloride 07/01/2017 97* 101 - 111 mmol/L Final  . CO2 07/01/2017 30  22 - 32 mmol/L Final  . Glucose, Bld 07/01/2017 174* 65 - 99 mg/dL Final  . BUN 07/01/2017 21* 6 - 20 mg/dL Final  . Creatinine, Ser 07/01/2017 1.25* 0.61 - 1.24 mg/dL Final  . Calcium 07/01/2017 8.9  8.9 - 10.3 mg/dL Final  . Total Protein 07/01/2017 8.5* 6.5 - 8.1 g/dL Final  . Albumin 07/01/2017 4.2  3.5 - 5.0 g/dL Final  . AST 07/01/2017 26  15 - 41 U/L Final  . ALT 07/01/2017 23  17 - 63 U/L Final  . Alkaline Phosphatase 07/01/2017 67  38 - 126 U/L Final  . Total Bilirubin 07/01/2017 0.9  0.3 - 1.2 mg/dL Final  . GFR calc non Af Amer 07/01/2017 51* >60 mL/min Final  . GFR calc Af  Amer 07/01/2017 60* >60 mL/min Final   Comment: (NOTE) The eGFR has been calculated using the CKD EPI equation. This calculation has not been validated in all clinical situations. eGFR's persistently <60 mL/min signify possible Chronic Kidney Disease.   Georgiann Hahn gap 07/01/2017 8  5 - 15 Final   Performed at [facility] [address] [place], [place] [address]  . Total Protein ELP 07/01/2017 7.8  6.0 - 8.5 g/dL Final  . IgG (Immunoglobin G), Serum 07/01/2017 2,246* 700 - 1,600 mg/dL Final  . IgA 07/01/2017 54* 61 - 437 mg/dL Final  . IgM (Immunoglobulin M), Srm 07/01/2017 30  15 - 143 mg/dL Final   Comment: (NOTE) Performed At: [facility] [address] [place], [place] [id number] Rush Farmer MD [id number]   . Immunofixation Result, Serum 07/01/2017 Comment   Corrected   Comment: (NOTE) Immunofixation shows IgG monoclonal protein with kappa light chain specificity. Please note that samples from patients receiving DARZALEX(R) (daratumumab) treatment can appear as an "IgG kappa" and mask a complete response. If this patient is receiving DARA, this IFE assay interference can be removed by ordering test number [id number]-"Immunofixation, Daratumumab-Specific, Serum" and submitting a new sample for testing or by calling the lab to add this test to the current sample. Performed at [facility], [address] [place], [place] [address]   . IgG (Immunoglobin G), Serum 07/01/2017 2,390* 700 - 1,600 mg/dL Final  . IgA 07/01/2017 59* 61 - 437 mg/dL Final  . IgM (Immunoglobulin M), Srm 07/01/2017 30  15 - 143 mg/dL Final   Comment: (NOTE) Performed At: [facility] [place], [place] [id number] Rush Farmer MD [id number] Performed at [facility], [address] [place], [place] [address]   . Kappa free light chain 07/01/2017 22.2* 3.3 - 19.4 mg/L Final  . Lamda free light chains 07/01/2017 12.2  5.7 - 26.3 mg/L Final  . Kappa, lamda light chain  ratio 07/01/2017  1.82* 0.26 - 1.65 Final   Comment: (NOTE) Performed At: [facility] [place], [place] [id number] Rush Farmer MD [id number] Performed at [facility], [address], [place], [place] [address]   . Total Protein ELP 07/01/2017 8.0  6.0 - 8.5 g/dL Final  . Albumin ELP 07/01/2017 4.5* 2.9 - 4.4 g/dL Final  . Alpha-1-Globulin 07/01/2017 0.2  0.0 - 0.4 g/dL Final  . Alpha-2-Globulin 07/01/2017 0.6  0.4 - 1.0 g/dL Final  . Beta Globulin 07/01/2017 0.7  0.7 - 1.3 g/dL Final  . Gamma Globulin 07/01/2017 1.9* 0.4 - 1.8 g/dL Final  . M-Spike, % 07/01/2017 1.6* Not Observed g/dL Final  . SPE Interp. 07/01/2017 Comment   Final   Comment: (NOTE) The SPE pattern demonstrates a single peak (M-spike) in the gamma region which may represent monoclonal protein. This peak may also be caused by circulating immune complexes, cryoglobulins, C-reactive protein, fibrinogen or hemolysis.  If clinically indicated, the presence of a monoclonal gammopathy may be confirmed by immuno- fixation, as well as an evaluation of the urine for the presence of Bence-Jones protein. Performed At: [facility] [place], [place] [id number] Rush Farmer MD [id number]   . Comment 07/01/2017 Comment   Final   Comment: (NOTE) Protein electrophoresis scan will follow via computer, mail, or courier delivery.   Marland Kitchen GLOBULIN, TOTAL 07/01/2017 3.5  2.2 - 3.9 g/dL Corrected  . A/G Ratio 07/01/2017 1.3  0.7 - 1.7 Corrected   Performed at [facility], [address] [place], [place] [address]     Pathology Orders Placed This Encounter  Procedures  . CBC with Differential/Platelet    Standing Status:   Future    Standing Expiration Date:   07/09/2018  . Comprehensive metabolic panel    Standing Status:   Future    Standing Expiration Date:   07/09/2018  . Lactate dehydrogenase    Standing Status:   Future    Standing Expiration Date:   07/09/2018  . Protein  electrophoresis, serum    Standing Status:   Future    Standing Expiration Date:   07/09/2018  . Beta 2 microglobulin, serum    Standing Status:   Future    Standing Expiration Date:   07/09/2018  . Kappa/lambda light chains    Standing Status:   Future    Standing Expiration Date:   07/09/2018  . IgG, IgA, IgM    Standing Status:   Future    Standing Expiration Date:   07/09/2018       Zoila Shutter MD

## 2017-07-08 NOTE — Patient Instructions (Signed)
[place] at [facility] Discharge Instructions   You were seen today by Dr. Zoila Shutter. Return in 6 months for labs and follow up.    Thank you for choosing [place] at [facility] to provide your oncology and hematology care.  To afford each patient quality time with our provider, please arrive at least 15 minutes before your scheduled appointment time.    If you have a lab appointment with the [facility] please come in thru the  Main Entrance and check in at the main information desk  You need to re-schedule your appointment should you arrive 10 or more minutes late.  We strive to give you quality time with our providers, and arriving late affects you and other patients whose appointments are after yours.  Also, if you no show three or more times for appointments you may be dismissed from the clinic at the providers discretion.     Again, thank you for choosing [facility].  Our hope is that these requests will decrease the amount of time that you wait before being seen by our physicians.       _____________________________________________________________  Should you have questions after your visit to [facility], please contact our office at [phone] between the hours of 8:30 a.m. and 4:30 p.m.  Voicemails left after 4:30 p.m. will not be returned until the following business day.  For prescription refill requests, have your pharmacy contact our office.       Resources For Cancer Patients and their Caregivers ? American Cancer Society: Can assist with transportation, wigs, general needs, runs Look Good Feel Better.        531-035-9227 ? Cancer Care: Provides financial assistance, online support groups, medication/co-pay assistance.  1-800-813-HOPE 952 447 2564) ? [facility] Assists [place] cancer patients and their families through emotional , educational and financial  support.  (336) 880-0313 ? [place] DSS Where to apply for food stamps, Medicaid and utility assistance. 918 504 4428 ? [facility]: Transportation to medical appointments. 321 188 8120 ? Social Security Administration: May apply for disability if have a Stage IV cancer. 605-537-8587 445-708-6334 ? [employer], Disability and Transit Services: Assists with nutrition, care and transit needs. [place] Support Programs:   > Cancer Support Group  2nd Tuesday of the month 1pm-2pm, Journey Room   > Creative Journey  3rd Tuesday of the month 1130am-1pm, Journey Room

## 2017-07-14 DIAGNOSIS — N182 Chronic kidney disease, stage 2 (mild): Secondary | ICD-10-CM | POA: Diagnosis not present

## 2017-07-14 DIAGNOSIS — E1129 Type 2 diabetes mellitus with other diabetic kidney complication: Secondary | ICD-10-CM | POA: Diagnosis not present

## 2017-07-14 DIAGNOSIS — I129 Hypertensive chronic kidney disease with stage 1 through stage 4 chronic kidney disease, or unspecified chronic kidney disease: Secondary | ICD-10-CM | POA: Diagnosis not present

## 2017-07-14 DIAGNOSIS — D472 Monoclonal gammopathy: Secondary | ICD-10-CM | POA: Diagnosis not present

## 2017-07-21 DIAGNOSIS — I129 Hypertensive chronic kidney disease with stage 1 through stage 4 chronic kidney disease, or unspecified chronic kidney disease: Secondary | ICD-10-CM | POA: Diagnosis not present

## 2017-07-21 DIAGNOSIS — E1129 Type 2 diabetes mellitus with other diabetic kidney complication: Secondary | ICD-10-CM | POA: Diagnosis not present

## 2017-07-21 DIAGNOSIS — I10 Essential (primary) hypertension: Secondary | ICD-10-CM | POA: Diagnosis not present

## 2017-07-21 DIAGNOSIS — C9000 Multiple myeloma not having achieved remission: Secondary | ICD-10-CM | POA: Diagnosis not present

## 2017-09-08 DIAGNOSIS — H402213 Chronic angle-closure glaucoma, right eye, severe stage: Secondary | ICD-10-CM | POA: Diagnosis not present

## 2017-09-08 DIAGNOSIS — H40012 Open angle with borderline findings, low risk, left eye: Secondary | ICD-10-CM | POA: Diagnosis not present

## 2017-09-08 DIAGNOSIS — Z961 Presence of intraocular lens: Secondary | ICD-10-CM | POA: Diagnosis not present

## 2017-09-08 DIAGNOSIS — H548 Legal blindness, as defined in USA: Secondary | ICD-10-CM | POA: Diagnosis not present

## 2017-09-23 DIAGNOSIS — C44521 Squamous cell carcinoma of skin of breast: Secondary | ICD-10-CM | POA: Diagnosis not present

## 2017-09-23 DIAGNOSIS — X32XXXD Exposure to sunlight, subsequent encounter: Secondary | ICD-10-CM | POA: Diagnosis not present

## 2017-09-23 DIAGNOSIS — L570 Actinic keratosis: Secondary | ICD-10-CM | POA: Diagnosis not present

## 2017-09-23 DIAGNOSIS — B078 Other viral warts: Secondary | ICD-10-CM | POA: Diagnosis not present

## 2017-10-15 DIAGNOSIS — C9000 Multiple myeloma not having achieved remission: Secondary | ICD-10-CM | POA: Diagnosis not present

## 2017-10-15 DIAGNOSIS — I10 Essential (primary) hypertension: Secondary | ICD-10-CM | POA: Diagnosis not present

## 2017-10-15 DIAGNOSIS — I129 Hypertensive chronic kidney disease with stage 1 through stage 4 chronic kidney disease, or unspecified chronic kidney disease: Secondary | ICD-10-CM | POA: Diagnosis not present

## 2017-10-15 DIAGNOSIS — E1129 Type 2 diabetes mellitus with other diabetic kidney complication: Secondary | ICD-10-CM | POA: Diagnosis not present

## 2017-10-21 DIAGNOSIS — Z0000 Encounter for general adult medical examination without abnormal findings: Secondary | ICD-10-CM | POA: Diagnosis not present

## 2017-10-21 DIAGNOSIS — Z23 Encounter for immunization: Secondary | ICD-10-CM | POA: Diagnosis not present

## 2018-01-05 ENCOUNTER — Inpatient Hospital Stay (HOSPITAL_COMMUNITY): Payer: Medicare Other | Attending: Hematology

## 2018-01-05 DIAGNOSIS — E119 Type 2 diabetes mellitus without complications: Secondary | ICD-10-CM | POA: Diagnosis not present

## 2018-01-05 DIAGNOSIS — C9000 Multiple myeloma not having achieved remission: Secondary | ICD-10-CM | POA: Insufficient documentation

## 2018-01-05 DIAGNOSIS — I129 Hypertensive chronic kidney disease with stage 1 through stage 4 chronic kidney disease, or unspecified chronic kidney disease: Secondary | ICD-10-CM | POA: Insufficient documentation

## 2018-01-05 DIAGNOSIS — D472 Monoclonal gammopathy: Secondary | ICD-10-CM

## 2018-01-05 DIAGNOSIS — Z85820 Personal history of malignant melanoma of skin: Secondary | ICD-10-CM | POA: Insufficient documentation

## 2018-01-05 DIAGNOSIS — N189 Chronic kidney disease, unspecified: Secondary | ICD-10-CM | POA: Diagnosis not present

## 2018-01-05 DIAGNOSIS — E876 Hypokalemia: Secondary | ICD-10-CM | POA: Insufficient documentation

## 2018-01-05 DIAGNOSIS — Z7984 Long term (current) use of oral hypoglycemic drugs: Secondary | ICD-10-CM | POA: Insufficient documentation

## 2018-01-05 DIAGNOSIS — N400 Enlarged prostate without lower urinary tract symptoms: Secondary | ICD-10-CM | POA: Diagnosis not present

## 2018-01-05 DIAGNOSIS — F17210 Nicotine dependence, cigarettes, uncomplicated: Secondary | ICD-10-CM | POA: Insufficient documentation

## 2018-01-05 DIAGNOSIS — Z79899 Other long term (current) drug therapy: Secondary | ICD-10-CM | POA: Diagnosis not present

## 2018-01-05 DIAGNOSIS — E7800 Pure hypercholesterolemia, unspecified: Secondary | ICD-10-CM | POA: Diagnosis not present

## 2018-01-05 DIAGNOSIS — I10 Essential (primary) hypertension: Secondary | ICD-10-CM | POA: Insufficient documentation

## 2018-01-05 LAB — CBC WITH DIFFERENTIAL/PLATELET
Abs Immature Granulocytes: 0.01 10*3/uL (ref 0.00–0.07)
BASOS ABS: 0 10*3/uL (ref 0.0–0.1)
Basophils Relative: 0 %
EOS PCT: 5 %
Eosinophils Absolute: 0.2 10*3/uL (ref 0.0–0.5)
HEMATOCRIT: 39.5 % (ref 39.0–52.0)
HEMOGLOBIN: 13.3 g/dL (ref 13.0–17.0)
IMMATURE GRANULOCYTES: 0 %
LYMPHS ABS: 1.3 10*3/uL (ref 0.7–4.0)
LYMPHS PCT: 34 %
MCH: 31.6 pg (ref 26.0–34.0)
MCHC: 33.7 g/dL (ref 30.0–36.0)
MCV: 93.8 fL (ref 80.0–100.0)
MONOS PCT: 8 %
Monocytes Absolute: 0.3 10*3/uL (ref 0.1–1.0)
NRBC: 0 % (ref 0.0–0.2)
Neutro Abs: 2 10*3/uL (ref 1.7–7.7)
Neutrophils Relative %: 53 %
PLATELETS: 185 10*3/uL (ref 150–400)
RBC: 4.21 MIL/uL — ABNORMAL LOW (ref 4.22–5.81)
RDW: 12.8 % (ref 11.5–15.5)
WBC: 3.7 10*3/uL — ABNORMAL LOW (ref 4.0–10.5)

## 2018-01-05 LAB — COMPREHENSIVE METABOLIC PANEL
ALT: 23 U/L (ref 0–44)
ANION GAP: 8 (ref 5–15)
AST: 26 U/L (ref 15–41)
Albumin: 4.8 g/dL (ref 3.5–5.0)
Alkaline Phosphatase: 69 U/L (ref 38–126)
BUN: 20 mg/dL (ref 8–23)
CHLORIDE: 100 mmol/L (ref 98–111)
CO2: 29 mmol/L (ref 22–32)
CREATININE: 1.26 mg/dL — AB (ref 0.61–1.24)
Calcium: 9.3 mg/dL (ref 8.9–10.3)
GFR calc non Af Amer: 52 mL/min — ABNORMAL LOW (ref >60)
Glucose, Bld: 156 mg/dL — ABNORMAL HIGH (ref 70–99)
Potassium: 3.3 mmol/L — ABNORMAL LOW (ref 3.5–5.1)
SODIUM: 137 mmol/L (ref 135–145)
Total Bilirubin: 0.6 mg/dL (ref 0.3–1.2)
Total Protein: 9 g/dL — ABNORMAL HIGH (ref 6.5–8.1)

## 2018-01-05 LAB — LACTATE DEHYDROGENASE: LDH: 141 U/L (ref 98–192)

## 2018-01-06 LAB — KAPPA/LAMBDA LIGHT CHAINS
KAPPA, LAMDA LIGHT CHAIN RATIO: 1.55 (ref 0.26–1.65)
Kappa free light chain: 20 mg/L — ABNORMAL HIGH (ref 3.3–19.4)
Lambda free light chains: 12.9 mg/L (ref 5.7–26.3)

## 2018-01-06 LAB — PROTEIN ELECTROPHORESIS, SERUM
A/G Ratio: 1.2 (ref 0.7–1.7)
ALPHA-2-GLOBULIN: 0.6 g/dL (ref 0.4–1.0)
Albumin ELP: 4.4 g/dL (ref 2.9–4.4)
Alpha-1-Globulin: 0.2 g/dL (ref 0.0–0.4)
BETA GLOBULIN: 0.8 g/dL (ref 0.7–1.3)
GAMMA GLOBULIN: 2 g/dL — AB (ref 0.4–1.8)
Globulin, Total: 3.7 g/dL (ref 2.2–3.9)
M-Spike, %: 1.7 g/dL — ABNORMAL HIGH
Total Protein ELP: 8.1 g/dL (ref 6.0–8.5)

## 2018-01-06 LAB — IGG, IGA, IGM
IGA: 69 mg/dL (ref 61–437)
IgG (Immunoglobin G), Serum: 2391 mg/dL — ABNORMAL HIGH (ref 700–1600)
IgM (Immunoglobulin M), Srm: 30 mg/dL (ref 15–143)

## 2018-01-06 LAB — BETA 2 MICROGLOBULIN, SERUM: Beta-2 Microglobulin: 2.2 mg/L (ref 0.6–2.4)

## 2018-01-12 ENCOUNTER — Encounter (HOSPITAL_COMMUNITY): Payer: Self-pay | Admitting: Internal Medicine

## 2018-01-12 ENCOUNTER — Inpatient Hospital Stay (HOSPITAL_COMMUNITY): Payer: Medicare Other | Attending: Hematology | Admitting: Internal Medicine

## 2018-01-12 VITALS — BP 187/60 | HR 60 | Temp 97.8°F | Resp 20 | Wt 179.9 lb

## 2018-01-12 DIAGNOSIS — Z79899 Other long term (current) drug therapy: Secondary | ICD-10-CM

## 2018-01-12 DIAGNOSIS — I129 Hypertensive chronic kidney disease with stage 1 through stage 4 chronic kidney disease, or unspecified chronic kidney disease: Secondary | ICD-10-CM

## 2018-01-12 DIAGNOSIS — N189 Chronic kidney disease, unspecified: Secondary | ICD-10-CM | POA: Diagnosis not present

## 2018-01-12 DIAGNOSIS — E876 Hypokalemia: Secondary | ICD-10-CM | POA: Diagnosis not present

## 2018-01-12 DIAGNOSIS — N400 Enlarged prostate without lower urinary tract symptoms: Secondary | ICD-10-CM

## 2018-01-12 DIAGNOSIS — D472 Monoclonal gammopathy: Secondary | ICD-10-CM

## 2018-01-12 DIAGNOSIS — E119 Type 2 diabetes mellitus without complications: Secondary | ICD-10-CM

## 2018-01-12 DIAGNOSIS — Z7982 Long term (current) use of aspirin: Secondary | ICD-10-CM | POA: Diagnosis not present

## 2018-01-12 DIAGNOSIS — C9000 Multiple myeloma not having achieved remission: Secondary | ICD-10-CM | POA: Diagnosis not present

## 2018-01-12 DIAGNOSIS — Z87891 Personal history of nicotine dependence: Secondary | ICD-10-CM | POA: Insufficient documentation

## 2018-01-12 DIAGNOSIS — E7800 Pure hypercholesterolemia, unspecified: Secondary | ICD-10-CM | POA: Diagnosis not present

## 2018-01-12 DIAGNOSIS — F17210 Nicotine dependence, cigarettes, uncomplicated: Secondary | ICD-10-CM

## 2018-01-12 DIAGNOSIS — E785 Hyperlipidemia, unspecified: Secondary | ICD-10-CM | POA: Insufficient documentation

## 2018-01-12 DIAGNOSIS — Z809 Family history of malignant neoplasm, unspecified: Secondary | ICD-10-CM | POA: Diagnosis not present

## 2018-01-12 DIAGNOSIS — I10 Essential (primary) hypertension: Secondary | ICD-10-CM

## 2018-01-12 DIAGNOSIS — Z85820 Personal history of malignant melanoma of skin: Secondary | ICD-10-CM | POA: Diagnosis not present

## 2018-01-12 DIAGNOSIS — Z7984 Long term (current) use of oral hypoglycemic drugs: Secondary | ICD-10-CM

## 2018-01-12 NOTE — Progress Notes (Signed)
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  Assessment and Plan:  1.  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  Pt was previously followed by Dr. Talbert Cage and was diagnosed in 2016. Bone marrow biopsy in 07/2014 with 10% plasma cells and normal cytogenetics. PET scan in 08/2014 demonstrated no lytic lesions. IgG at diagnosis ~2400. Pt has remained on observation.   Labs done 01/05/2018 reviewed and showed WBC 3.7 HB 13.3 plts 185,000.  Chemistries WNL with Cr 1.26 and normal LFTs.  Calcium 9.3.  SPEP stable measuring 1.7 g/dl.   K/L light chain ratio 1.55  IGG stable at 2391.  Skeletal survey done 07/01/2017 showed no lytic lesions.  Pt will RTC in 07/2018 for follow-up to go over repeat labs.   2.  Hypokalemia.  K+ slightly decreased at 3.3.  Pt on HCTZ and po potassium.  Pt advised to follow-up with PCP for monitoring.    3.  HTN.  BP is 187/60.  Follow-up with PCP.    4.  DM.  Continue to follow-up with PCP.    Current Status:  Pt is seen today for follow-up.  He is here to over labs.     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 Stable exam. No significant lytic lesions identified. Exam unchanged from prior metastatic bone survey of 5:16 2017 .  2. Carotid, aortoiliac, peripheral vascular disease.      Problem List Patient Active Problem List   Diagnosis Date Noted  . 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 Benign prostatic hypertrophy [N40.0] 08/09/2013  . Diabetes mellitus ([place])  [E11.9] 08/09/2013  . Chronic kidney disease [N18.9] 08/09/2013  . Hypertension [I10] 08/09/2013  . 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 Diabetes mellitus ([place])   . Hypercholesteremia   . Hypertension   . Melanoma ([place])    lower back  . Other pancytopenia ([place]) 02/25/2014  . 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 AMPUTATION FINGER / THUMB Left    forefinger  . BONE MARROW ASPIRATION Left 07/19/14  . BONE MARROW BIOPSY Left 07/19/14  . CATARACT EXTRACTION Bilateral   . CORNEAL TRANSPLANT Right   . MELANOMA EXCISION Left    left flank area 1980s  . TONSILLECTOMY      Family History Family History  Problem Relation Age of Onset  . Cancer Mother   . Stroke Father      Social History  reports that he has quit smoking. His smoking use included cigarettes. He has quit using smokeless tobacco.  His smokeless tobacco use included chew. He reports that he does not drink alcohol or use drugs.  Medications  Current Outpatient Medications:  .  amLODipine (NORVASC) 10 MG tablet, Take 10 mg by mouth daily. , Disp: , Rfl:  .  aspirin EC 325 MG tablet, Take 325 mg by mouth daily., Disp: , Rfl:  .  doxazosin (CARDURA) 4 MG tablet, ,  Disp: , Rfl: 1 .  DOXAZOSIN MESYLATE PO, Take 4 mg by mouth daily., Disp: , Rfl:  .  finasteride (PROSCAR) 5 MG tablet, Take 5 mg by mouth daily., Disp: , Rfl: 0 .  glipiZIDE (GLUCOTROL XL) 5 MG 24 hr tablet, Take 5 mg by mouth daily with breakfast. , Disp: , Rfl:  .  metoprolol (LOPRESSOR) 100 MG tablet, Take 100 mg by mouth 2 (two) times daily., Disp: , Rfl:  .  Multiple Vitamins-Minerals (CENTRUM SILVER PO), Take 1 capsule by mouth daily., Disp: , Rfl:  .  potassium chloride SA (K-DUR,KLOR-CON) 20 MEQ tablet, Take 20 mEq by mouth daily., Disp: , Rfl:  .  pravastatin  (PRAVACHOL) 40 MG tablet, Take 40 mg by mouth daily., Disp: , Rfl:  .  timolol (TIMOPTIC) 0.5 % ophthalmic solution, Place 1 drop into the right eye daily. , Disp: , Rfl: 1 .  valsartan-hydrochlorothiazide (DIOVAN-HCT) 160-12.5 MG per tablet, Take 1 tablet by mouth daily., Disp: , Rfl:  .  vitamin C (ASCORBIC ACID) 500 MG tablet, Take 500 mg by mouth daily., Disp: , Rfl:   Allergies Patient has no known allergies.  Review of Systems Review of Systems - Oncology ROS negative   Physical Exam  Vitals Wt Readings from Last 3 Encounters:  01/12/18 179 lb 14.4 oz (81.6 kg)  07/08/17 176 lb (79.8 kg)  03/11/17 178 lb (80.7 kg)   Temp Readings from Last 3 Encounters:  01/12/18 97.8 F (36.6 C) (Oral)  07/08/17 98.8 F (37.1 C) (Oral)  03/11/17 98.5 F (36.9 C) (Oral)   BP Readings from Last 3 Encounters:  01/12/18 (!) 187/60  07/08/17 (!) 181/74  03/11/17 (!) 161/60   Pulse Readings from Last 3 Encounters:  01/12/18 60  07/08/17 (!) 58  03/11/17 (!) 52   Constitutional: Well-developed, well-nourished, and in no distress.   HENT: Head: Normocephalic and atraumatic.  Mouth/Throat: No oropharyngeal exudate. Mucosa moist. Eyes: Pupils are equal, round, and reactive to light. Conjunctivae are normal. No scleral icterus.  Neck: Normal range of motion. Neck supple. No JVD present.  Cardiovascular: Normal rate, regular rhythm and normal heart sounds.  Exam reveals no gallop and no friction rub.   No murmur heard. Pulmonary/Chest: Effort normal and breath sounds normal. No respiratory distress. No wheezes.No rales.  Abdominal: Soft. Bowel sounds are normal. No distension. There is no tenderness. There is no guarding.  Musculoskeletal: No edema or tenderness.  Lymphadenopathy: No cervical, axillary or supraclavicular adenopathy.  Neurological: Alert and oriented to person, place, and time. No cranial nerve deficit.  Skin: Skin is warm and dry. No rash noted. No erythema. No pallor.    Psychiatric: Affect and judgment normal.   Labs No visits with results within 3 Day(s) from this visit.  Latest known visit with results is:  Appointment on 01/05/2018  Component Date Value Ref Range Status  . WBC 01/05/2018 3.7* 4.0 - 10.5 K/uL Final  . RBC 01/05/2018 4.21* 4.22 - 5.81 MIL/uL Final  . Hemoglobin 01/05/2018 13.3  13.0 - 17.0 g/dL Final  . HCT 01/05/2018 39.5  39.0 - 52.0 % Final  . MCV 01/05/2018 93.8  80.0 - 100.0 fL Final  . MCH 01/05/2018 31.6  26.0 - 34.0 pg Final  . MCHC 01/05/2018 33.7  30.0 - 36.0 g/dL Final  . RDW 01/05/2018 12.8  11.5 - 15.5 % Final  . Platelets 01/05/2018 185  150 - 400 K/uL Final  . nRBC 01/05/2018 0.0  0.0 - 0.2 %  Final  . Neutrophils Relative % 01/05/2018 53  % Final  . Neutro Abs 01/05/2018 2.0  1.7 - 7.7 K/uL Final  . Lymphocytes Relative 01/05/2018 34  % Final  . Lymphs Abs 01/05/2018 1.3  0.7 - 4.0 K/uL Final  . Monocytes Relative 01/05/2018 8  % Final  . Monocytes Absolute 01/05/2018 0.3  0.1 - 1.0 K/uL Final  . Eosinophils Relative 01/05/2018 5  % Final  . Eosinophils Absolute 01/05/2018 0.2  0.0 - 0.5 K/uL Final  . Basophils Relative 01/05/2018 0  % Final  . Basophils Absolute 01/05/2018 0.0  0.0 - 0.1 K/uL Final  . Immature Granulocytes 01/05/2018 0  % Final  . Abs Immature Granulocytes 01/05/2018 0.01  0.00 - 0.07 K/uL Final   Performed at [facility], [address] [place], [place] [address]  . Sodium 01/05/2018 137  135 - 145 mmol/L Final  . Potassium 01/05/2018 3.3* 3.5 - 5.1 mmol/L Final  . Chloride 01/05/2018 100  98 - 111 mmol/L Final  . CO2 01/05/2018 29  22 - 32 mmol/L Final  . Glucose, Bld 01/05/2018 156* 70 - 99 mg/dL Final  . BUN 01/05/2018 20  8 - 23 mg/dL Final  . Creatinine, Ser 01/05/2018 1.26* 0.61 - 1.24 mg/dL Final  . Calcium 01/05/2018 9.3  8.9 - 10.3 mg/dL Final  . Total Protein 01/05/2018 9.0* 6.5 - 8.1 g/dL Final  . Albumin 01/05/2018 4.8  3.5 - 5.0 g/dL Final  . AST 01/05/2018 26  15 - 41 U/L  Final  . ALT 01/05/2018 23  0 - 44 U/L Final  . Alkaline Phosphatase 01/05/2018 69  38 - 126 U/L Final  . Total Bilirubin 01/05/2018 0.6  0.3 - 1.2 mg/dL Final  . GFR calc non Af Amer 01/05/2018 52* >60 mL/min Final  . GFR calc Af Amer 01/05/2018 >60  >60 mL/min Final  . Anion gap 01/05/2018 8  5 - 15 Final   Performed at [facility], [address], [place], [place] [address]  . LDH 01/05/2018 141  98 - 192 U/L Final   Performed at [facility], [address], [place], [place] [address]  . Total Protein ELP 01/05/2018 8.1  6.0 - 8.5 g/dL Final  . Albumin ELP 01/05/2018 4.4  2.9 - 4.4 g/dL Final  . Alpha-1-Globulin 01/05/2018 0.2  0.0 - 0.4 g/dL Final  . Alpha-2-Globulin 01/05/2018 0.6  0.4 - 1.0 g/dL Final  . Beta Globulin 01/05/2018 0.8  0.7 - 1.3 g/dL Final  . Gamma Globulin 01/05/2018 2.0* 0.4 - 1.8 g/dL Final  . M-Spike, % 01/05/2018 1.7* Not Observed g/dL Final  . SPE Interp. 01/05/2018 Comment   Final   Comment: (NOTE) The SPE pattern demonstrates a single peak (M-spike) in the gamma region which may represent monoclonal protein. This peak may also be caused by circulating immune complexes, cryoglobulins, C-reactive protein, fibrinogen or hemolysis.  If clinically indicated, the presence of a monoclonal gammopathy may be confirmed by immuno- fixation, as well as an evaluation of the urine for the presence of Bence-Jones protein. Performed At: [facility] [place], [place] [id number] Rush Farmer MD VP:[id number]   . Comment 01/05/2018 Comment   Final   Comment: (NOTE) Protein electrophoresis scan will follow via computer, mail, or courier delivery.   Marland Kitchen GLOBULIN, TOTAL 01/05/2018 3.7  2.2 - 3.9 g/dL Corrected  . A/G Ratio 01/05/2018 1.2  0.7 - 1.7 Corrected  . Beta-2 Microglobulin 01/05/2018 2.2  0.6 - 2.4 mg/L  Final   Comment: (NOTE) Siemens Immulite 2000 Immunochemiluminometric assay (ICMA) Values obtained with different assay methods or kits  cannot be used interchangeably. Results cannot be interpreted as absolute evidence of the presence or absence of malignant disease. Performed At: [facility] [place], [place] [id number] Rush Farmer MD YT:[id number]   . Kappa free light chain 01/05/2018 20.0* 3.3 - 19.4 mg/L Final  . Lamda free light chains 01/05/2018 12.9  5.7 - 26.3 mg/L Final  . Kappa, lamda light chain ratio 01/05/2018 1.55  0.26 - 1.65 Final   Comment: (NOTE) Performed At: [facility] [place], [place] [id number] Rush Farmer MD [id number]   . IgG (Immunoglobin G), Serum 01/05/2018 2,391* 700 - 1,600 mg/dL Final  . IgA 01/05/2018 69  61 - 437 mg/dL Final  . IgM (Immunoglobulin M), Srm 01/05/2018 30  15 - 143 mg/dL Final   Comment: (NOTE) Performed At: [facility] [place], [place] [id number] Rush Farmer MD PX:[id number]      Pathology Orders Placed This Encounter  Procedures  . CBC with Differential/Platelet    Standing Status:   Future    Standing Expiration Date:   01/13/2020  . Comprehensive metabolic panel    Standing Status:   Future    Standing Expiration Date:   01/13/2020  . Lactate dehydrogenase    Standing Status:   Future    Standing Expiration Date:   01/13/2020  . Protein electrophoresis, serum    Standing Status:   Future    Standing Expiration Date:   01/13/2020  . IgG, IgA, IgM    Standing Status:   Future    Standing Expiration Date:   01/13/2020  . Kappa/lambda light chains    Standing Status:   Future    Standing Expiration Date:   01/13/2020       Zoila Shutter MD

## 2018-01-12 NOTE — Patient Instructions (Signed)
[facility] at [facility] Discharge Instructions  You were seen by Dr. Higgs today   Thank you for choosing [facility] at [facility] to provide your oncology and hematology care.  To afford each patient quality time with our provider, please arrive at least 15 minutes before your scheduled appointment time.   If you have a lab appointment with the Cancer Center please come in thru the  Main Entrance and check in at the main information desk  You need to re-schedule your appointment should you arrive 10 or more minutes late.  We strive to give you quality time with our providers, and arriving late affects you and other patients whose appointments are after yours.  Also, if you no show three or more times for appointments you may be dismissed from the clinic at the providers discretion.     Again, thank you for choosing [facility].  Our hope is that these requests will decrease the amount of time that you wait before being seen by our physicians.       _____________________________________________________________  Should you have questions after your visit to [facility], please contact our office at [phone] between the hours of 8:00 a.m. and 4:30 p.m.  Voicemails left after 4:00 p.m. will not be returned until the following business day.  For prescription refill requests, have your pharmacy contact our office and allow 72 hours.    Cancer Center Support Programs:   > Cancer Support Group  2nd Tuesday of the month 1pm-2pm, Journey Room   

## 2018-03-10 DIAGNOSIS — Z85820 Personal history of malignant melanoma of skin: Secondary | ICD-10-CM | POA: Diagnosis not present

## 2018-03-10 DIAGNOSIS — L820 Inflamed seborrheic keratosis: Secondary | ICD-10-CM | POA: Diagnosis not present

## 2018-03-10 DIAGNOSIS — Z1283 Encounter for screening for malignant neoplasm of skin: Secondary | ICD-10-CM | POA: Diagnosis not present

## 2018-03-10 DIAGNOSIS — L570 Actinic keratosis: Secondary | ICD-10-CM | POA: Diagnosis not present

## 2018-03-10 DIAGNOSIS — L821 Other seborrheic keratosis: Secondary | ICD-10-CM | POA: Diagnosis not present

## 2018-03-10 DIAGNOSIS — Z08 Encounter for follow-up examination after completed treatment for malignant neoplasm: Secondary | ICD-10-CM | POA: Diagnosis not present

## 2018-04-11 DIAGNOSIS — N390 Urinary tract infection, site not specified: Secondary | ICD-10-CM | POA: Diagnosis not present

## 2018-04-19 DIAGNOSIS — I10 Essential (primary) hypertension: Secondary | ICD-10-CM | POA: Diagnosis not present

## 2018-04-19 DIAGNOSIS — I129 Hypertensive chronic kidney disease with stage 1 through stage 4 chronic kidney disease, or unspecified chronic kidney disease: Secondary | ICD-10-CM | POA: Diagnosis not present

## 2018-04-19 DIAGNOSIS — E785 Hyperlipidemia, unspecified: Secondary | ICD-10-CM | POA: Diagnosis not present

## 2018-04-19 DIAGNOSIS — N182 Chronic kidney disease, stage 2 (mild): Secondary | ICD-10-CM | POA: Diagnosis not present

## 2018-04-21 DIAGNOSIS — D472 Monoclonal gammopathy: Secondary | ICD-10-CM | POA: Diagnosis not present

## 2018-04-21 DIAGNOSIS — I10 Essential (primary) hypertension: Secondary | ICD-10-CM | POA: Diagnosis not present

## 2018-04-21 DIAGNOSIS — E1121 Type 2 diabetes mellitus with diabetic nephropathy: Secondary | ICD-10-CM | POA: Diagnosis not present

## 2018-04-21 DIAGNOSIS — N182 Chronic kidney disease, stage 2 (mild): Secondary | ICD-10-CM | POA: Diagnosis not present

## 2018-07-04 DIAGNOSIS — D0462 Carcinoma in situ of skin of left upper limb, including shoulder: Secondary | ICD-10-CM | POA: Diagnosis not present

## 2018-07-04 DIAGNOSIS — L570 Actinic keratosis: Secondary | ICD-10-CM | POA: Diagnosis not present

## 2018-07-04 DIAGNOSIS — X32XXXD Exposure to sunlight, subsequent encounter: Secondary | ICD-10-CM | POA: Diagnosis not present

## 2018-07-04 DIAGNOSIS — C44629 Squamous cell carcinoma of skin of left upper limb, including shoulder: Secondary | ICD-10-CM | POA: Diagnosis not present

## 2018-07-07 ENCOUNTER — Other Ambulatory Visit (HOSPITAL_COMMUNITY): Payer: Self-pay | Admitting: *Deleted

## 2018-07-07 DIAGNOSIS — D472 Monoclonal gammopathy: Secondary | ICD-10-CM

## 2018-07-07 DIAGNOSIS — C9000 Multiple myeloma not having achieved remission: Secondary | ICD-10-CM

## 2018-07-08 ENCOUNTER — Other Ambulatory Visit: Payer: Self-pay

## 2018-07-08 ENCOUNTER — Inpatient Hospital Stay (HOSPITAL_COMMUNITY): Payer: Medicare Other | Attending: Hematology

## 2018-07-08 DIAGNOSIS — Z7984 Long term (current) use of oral hypoglycemic drugs: Secondary | ICD-10-CM | POA: Insufficient documentation

## 2018-07-08 DIAGNOSIS — Z7982 Long term (current) use of aspirin: Secondary | ICD-10-CM | POA: Insufficient documentation

## 2018-07-08 DIAGNOSIS — C9000 Multiple myeloma not having achieved remission: Secondary | ICD-10-CM | POA: Insufficient documentation

## 2018-07-08 DIAGNOSIS — I739 Peripheral vascular disease, unspecified: Secondary | ICD-10-CM | POA: Diagnosis not present

## 2018-07-08 DIAGNOSIS — Z79899 Other long term (current) drug therapy: Secondary | ICD-10-CM | POA: Diagnosis not present

## 2018-07-08 DIAGNOSIS — D472 Monoclonal gammopathy: Secondary | ICD-10-CM

## 2018-07-08 LAB — CBC WITH DIFFERENTIAL/PLATELET
Abs Immature Granulocytes: 0.01 10*3/uL (ref 0.00–0.07)
Basophils Absolute: 0 10*3/uL (ref 0.0–0.1)
Basophils Relative: 0 %
Eosinophils Absolute: 0.2 10*3/uL (ref 0.0–0.5)
Eosinophils Relative: 3 %
HCT: 37.9 % — ABNORMAL LOW (ref 39.0–52.0)
Hemoglobin: 12.5 g/dL — ABNORMAL LOW (ref 13.0–17.0)
Immature Granulocytes: 0 %
Lymphocytes Relative: 30 %
Lymphs Abs: 1.7 10*3/uL (ref 0.7–4.0)
MCH: 31.2 pg (ref 26.0–34.0)
MCHC: 33 g/dL (ref 30.0–36.0)
MCV: 94.5 fL (ref 80.0–100.0)
Monocytes Absolute: 0.4 10*3/uL (ref 0.1–1.0)
Monocytes Relative: 8 %
Neutro Abs: 3.2 10*3/uL (ref 1.7–7.7)
Neutrophils Relative %: 59 %
Platelets: 187 10*3/uL (ref 150–400)
RBC: 4.01 MIL/uL — ABNORMAL LOW (ref 4.22–5.81)
RDW: 13.1 % (ref 11.5–15.5)
WBC: 5.5 10*3/uL (ref 4.0–10.5)
nRBC: 0 % (ref 0.0–0.2)

## 2018-07-08 LAB — COMPREHENSIVE METABOLIC PANEL
ALT: 21 U/L (ref 0–44)
AST: 23 U/L (ref 15–41)
Albumin: 4.4 g/dL (ref 3.5–5.0)
Alkaline Phosphatase: 76 U/L (ref 38–126)
Anion gap: 9 (ref 5–15)
BUN: 29 mg/dL — ABNORMAL HIGH (ref 8–23)
CO2: 29 mmol/L (ref 22–32)
Calcium: 9.3 mg/dL (ref 8.9–10.3)
Chloride: 101 mmol/L (ref 98–111)
Creatinine, Ser: 1.53 mg/dL — ABNORMAL HIGH (ref 0.61–1.24)
GFR calc Af Amer: 47 mL/min — ABNORMAL LOW (ref >60)
GFR calc non Af Amer: 41 mL/min — ABNORMAL LOW (ref >60)
Glucose, Bld: 153 mg/dL — ABNORMAL HIGH (ref 70–99)
Potassium: 3.6 mmol/L (ref 3.5–5.1)
Sodium: 139 mmol/L (ref 135–145)
Total Bilirubin: 0.7 mg/dL (ref 0.3–1.2)
Total Protein: 8.8 g/dL — ABNORMAL HIGH (ref 6.5–8.1)

## 2018-07-08 LAB — LACTATE DEHYDROGENASE: LDH: 144 U/L (ref 98–192)

## 2018-07-09 LAB — IGG, IGA, IGM
IgA: 65 mg/dL (ref 61–437)
IgG (Immunoglobin G), Serum: 2344 mg/dL — ABNORMAL HIGH (ref 603–1613)
IgM (Immunoglobulin M), Srm: 34 mg/dL (ref 15–143)

## 2018-07-11 LAB — PROTEIN ELECTROPHORESIS, SERUM
A/G Ratio: 1 (ref 0.7–1.7)
Albumin ELP: 3.8 g/dL (ref 2.9–4.4)
Alpha-1-Globulin: 0.2 g/dL (ref 0.0–0.4)
Alpha-2-Globulin: 0.7 g/dL (ref 0.4–1.0)
Beta Globulin: 0.8 g/dL (ref 0.7–1.3)
Gamma Globulin: 2.1 g/dL — ABNORMAL HIGH (ref 0.4–1.8)
Globulin, Total: 3.9 g/dL (ref 2.2–3.9)
M-Spike, %: 1.7 g/dL — ABNORMAL HIGH
Total Protein ELP: 7.7 g/dL (ref 6.0–8.5)

## 2018-07-11 LAB — KAPPA/LAMBDA LIGHT CHAINS
Kappa free light chain: 30 mg/L — ABNORMAL HIGH (ref 3.3–19.4)
Kappa, lambda light chain ratio: 2.38 — ABNORMAL HIGH (ref 0.26–1.65)
Lambda free light chains: 12.6 mg/L (ref 5.7–26.3)

## 2018-07-14 ENCOUNTER — Inpatient Hospital Stay (HOSPITAL_COMMUNITY): Payer: Medicare Other | Admitting: Hematology

## 2018-07-14 ENCOUNTER — Other Ambulatory Visit: Payer: Self-pay

## 2018-07-14 ENCOUNTER — Encounter (HOSPITAL_COMMUNITY): Payer: Self-pay | Admitting: Hematology

## 2018-07-14 VITALS — BP 174/67 | HR 59 | Temp 98.4°F | Resp 16 | Wt 178.5 lb

## 2018-07-14 DIAGNOSIS — Z79899 Other long term (current) drug therapy: Secondary | ICD-10-CM | POA: Diagnosis not present

## 2018-07-14 DIAGNOSIS — D472 Monoclonal gammopathy: Secondary | ICD-10-CM

## 2018-07-14 DIAGNOSIS — Z7982 Long term (current) use of aspirin: Secondary | ICD-10-CM

## 2018-07-14 DIAGNOSIS — Z7984 Long term (current) use of oral hypoglycemic drugs: Secondary | ICD-10-CM | POA: Diagnosis not present

## 2018-07-14 DIAGNOSIS — I739 Peripheral vascular disease, unspecified: Secondary | ICD-10-CM | POA: Diagnosis not present

## 2018-07-14 DIAGNOSIS — C9000 Multiple myeloma not having achieved remission: Secondary | ICD-10-CM | POA: Diagnosis not present

## 2018-07-14 NOTE — Assessment & Plan Note (Signed)
1.  IgG kappa smoldering myeloma: - Bone marrow biopsy on 07/19/2014 shows 10% plasma cells with normal cytogenetics and FISH panel. -PET scan on 08/14/2014 was negative for lytic lesions. -Last skeletal survey in May 2019 was also negative for any lytic lesions. - I reviewed his most recent blood work.  His creatinine has gone up to 1.53.  This was 1.266 months ago.  Calcium was normal.  Hemoglobin is more or less stable around 12.5.  M spike was stable at 1.7 g/dL. -Free kappa light chains has also gone up to 30, from 20 previously.  Free light chain ratio has also increased to 2.38.  This was previously 1.5. -He does not report any new onset bone pains.  Denies any fevers, night sweats or weight loss.  Lives by himself and is able to do all his ADLs and IADLs. - Because of the new changes, I have recommended 19-monthfollow-up with repeat blood work and sMarine scientist

## 2018-07-14 NOTE — Patient Instructions (Signed)
[facility] at [facility] Discharge Instructions  You were seen today by Dr. Katragadda. He went over your recent lab results. He will see you back in 3 months for labs and follow up.   Thank you for choosing [facility] at [facility] to provide your oncology and hematology care.  To afford each patient quality time with our provider, please arrive at least 15 minutes before your scheduled appointment time.   If you have a lab appointment with the Cancer Center please come in thru the  Main Entrance and check in at the main information desk  You need to re-schedule your appointment should you arrive 10 or more minutes late.  We strive to give you quality time with our providers, and arriving late affects you and other patients whose appointments are after yours.  Also, if you no show three or more times for appointments you may be dismissed from the clinic at the providers discretion.     Again, thank you for choosing [facility].  Our hope is that these requests will decrease the amount of time that you wait before being seen by our physicians.       _____________________________________________________________  Should you have questions after your visit to [facility], please contact our office at [phone] between the hours of 8:00 a.m. and 4:30 p.m.  Voicemails left after 4:00 p.m. will not be returned until the following business day.  For prescription refill requests, have your pharmacy contact our office and allow 72 hours.    Cancer Center Support Programs:   > Cancer Support Group  2nd Tuesday of the month 1pm-2pm, Journey Room    

## 2018-07-14 NOTE — Progress Notes (Signed)
 Patient Care Team: Hawkins, Edward, MD as PCP - General (Pulmonary Disease)  DIAGNOSIS:  Encounter Diagnosis  Name Primary?  . Smoldering multiple myeloma (SMM) (HCC) Yes    SUMMARY OF ONCOLOGIC HISTORY: Oncology History  Smoldering multiple myeloma (SMM) (HCC)  02/25/2014 Initial Diagnosis   Smoldering multiple myeloma (SMM) (HCC)   06/25/2016 Imaging   Bone survey- 1. Stable exam. No significant lytic lesions identified. Exam unchanged from prior metastatic bone survey of 5:16 2017 .  2. Carotid, aortoiliac, peripheral vascular disease.     CHIEF COMPLIANT: Smoldering myeloma.  INTERVAL HISTORY: Maurice Cooper is a 85-year-old very pleasant white male who is seen in consultation today for follow-up of IgG kappa smoldering myeloma.  He was initially diagnosed in 2016 with a bone marrow biopsy.  He denies any new onset bone pains in the last 6 months.  Denies any fevers, night sweats or weight loss.  He lives by himself and is able to function independently.  Denies any recurrent infections or hospitalizations.  Has mild itching which is generalized and stable.  REVIEW OF SYSTEMS:   Constitutional: Denies fevers, chills or abnormal weight loss Eyes: Denies blurriness of vision Ears, nose, mouth, throat, and face: Denies mucositis or sore throat Respiratory: Denies cough, dyspnea or wheezes Cardiovascular: Denies palpitation, chest discomfort Gastrointestinal:  Denies nausea, heartburn or change in bowel habits Skin: Denies abnormal skin rashes.  Positive for itching. Lymphatics: Denies new lymphadenopathy or easy bruising Neurological:Denies numbness, tingling or new weaknesses Behavioral/Psych: Mood is stable, no new changes  Extremities: No lower extremity edema All other systems were reviewed with the patient and are negative.  I have reviewed the past medical history, past surgical history, social history and family history with the patient and they are unchanged from  previous note.  ALLERGIES:  has No Known Allergies.  MEDICATIONS:  Current Outpatient Medications  Medication Sig Dispense Refill  . amLODipine (NORVASC) 10 MG tablet Take 10 mg by mouth daily.     . aspirin EC 325 MG tablet Take 325 mg by mouth daily.    . doxazosin (CARDURA) 4 MG tablet   1  . DOXAZOSIN MESYLATE PO Take 4 mg by mouth daily.    . finasteride (PROSCAR) 5 MG tablet Take 5 mg by mouth daily.  0  . glipiZIDE (GLUCOTROL XL) 5 MG 24 hr tablet Take 5 mg by mouth daily with breakfast.     . metoprolol (LOPRESSOR) 100 MG tablet Take 100 mg by mouth 2 (two) times daily.    . Multiple Vitamins-Minerals (CENTRUM SILVER PO) Take 1 capsule by mouth daily.    . potassium chloride SA (K-DUR,KLOR-CON) 20 MEQ tablet Take 20 mEq by mouth daily.    . pravastatin (PRAVACHOL) 40 MG tablet Take 40 mg by mouth daily.    . timolol (TIMOPTIC) 0.5 % ophthalmic solution Place 1 drop into the right eye daily.   1  . valsartan-hydrochlorothiazide (DIOVAN-HCT) 160-12.5 MG per tablet Take 1 tablet by mouth daily.    . vitamin C (ASCORBIC ACID) 500 MG tablet Take 500 mg by mouth daily.     No current facility-administered medications for this visit.     PHYSICAL EXAMINATION: ECOG PERFORMANCE STATUS: 1 - Symptomatic but completely ambulatory  Vitals:   07/14/18 1600  BP: (!) 174/67  Pulse: (!) 59  Resp: 16  Temp: 98.4 F (36.9 C)  SpO2: 98%   Filed Weights   07/14/18 1600  Weight: 178 lb 8 oz (81 kg)      GENERAL:alert, no distress and comfortable SKIN: skin color, texture, turgor are normal, no rashes or significant lesions EYES: normal, Conjunctiva are pink and non-injected, sclera clear OROPHARYNX:no mucositis, no erythema and lips, buccal mucosa, and tongue normal  NECK: supple, thyroid normal size, non-tender, without nodularity LYMPH:  no palpable lymphadenopathy in the cervical, axillary or inguinal LUNGS: clear to auscultation and percussion with normal breathing effort HEART:  regular rate & rhythm and no murmurs and no lower extremity edema ABDOMEN:abdomen soft, non-tender and normal bowel sounds MUSCULOSKELETAL:no cyanosis of digits and no clubbing   EXTREMITIES: No lower extremity edema   LABORATORY DATA:  I have reviewed the data as listed CMP Latest Ref Rng & Units 07/08/2018 01/05/2018 07/01/2017  Glucose 70 - 99 mg/dL 153(H) 156(H) 174(H)  BUN 8 - 23 mg/dL 29(H) 20 21(H)  Creatinine 0.61 - 1.24 mg/dL 1.53(H) 1.26(H) 1.25(H)  Sodium 135 - 145 mmol/L 139 137 135  Potassium 3.5 - 5.1 mmol/L 3.6 3.3(L) 3.7  Chloride 98 - 111 mmol/L 101 100 97(L)  CO2 22 - 32 mmol/L 29 29 30  Calcium 8.9 - 10.3 mg/dL 9.3 9.3 8.9  Total Protein 6.5 - 8.1 g/dL 8.8(H) 9.0(H) 8.5(H)  Total Bilirubin 0.3 - 1.2 mg/dL 0.7 0.6 0.9  Alkaline Phos 38 - 126 U/L 76 69 67  AST 15 - 41 U/L 23 26 26  ALT 0 - 44 U/L 21 23 23   No results found for: CAN153   Lab Results  Component Value Date   WBC 5.5 07/08/2018   HGB 12.5 (L) 07/08/2018   HCT 37.9 (L) 07/08/2018   MCV 94.5 07/08/2018   PLT 187 07/08/2018   NEUTROABS 3.2 07/08/2018    ASSESSMENT & PLAN:  Smoldering multiple myeloma (SMM) 1.  IgG kappa smoldering myeloma: - Bone marrow biopsy on 07/19/2014 shows 10% plasma cells with normal cytogenetics and FISH panel. -PET scan on 08/14/2014 was negative for lytic lesions. -Last skeletal survey in May 2019 was also negative for any lytic lesions. - I reviewed his most recent blood work.  His creatinine has gone up to 1.53.  This was 1.266 months ago.  Calcium was normal.  Hemoglobin is more or less stable around 12.5.  M spike was stable at 1.7 g/dL. -Free kappa light chains has also gone up to 30, from 20 previously.  Free light chain ratio has also increased to 2.38.  This was previously 1.5. -He does not report any new onset bone pains.  Denies any fevers, night sweats or weight loss.  Lives by himself and is able to do all his ADLs and IADLs. - Because of the new changes, I  have recommended 3-month follow-up with repeat blood work and skeletal survey.     I spent 25 minutes talking to the patient of which more than half was spent in counseling and coordination of care.  Orders Placed This Encounter  Procedures  . DG Bone Survey Met    Standing Status:   Future    Standing Expiration Date:   09/13/2019    Order Specific Question:   Reason for Exam (SYMPTOM  OR DIAGNOSIS REQUIRED)    Answer:   smoldering myeloma    Order Specific Question:   Preferred imaging location?    Answer:   [facility]    Order Specific Question:   Radiology Contrast Protocol - do NOT remove file path    Answer:   [email or web address]  . CBC with Differential/Platelet    Standing Status:     Future    Standing Expiration Date:   07/14/2019  . Comprehensive metabolic panel    Standing Status:   Future    Standing Expiration Date:   07/14/2019  . Protein electrophoresis, serum    Standing Status:   Future    Standing Expiration Date:   07/14/2019  . Kappa/lambda light chains    Standing Status:   Future    Standing Expiration Date:   07/14/2019  . Lactate dehydrogenase    Standing Status:   Future    Standing Expiration Date:   07/14/2019   The patient has a good understanding of the overall plan. he agrees with it. he will call with any problems that may develop before the next visit here.   Derek Jack, MD 07/14/18

## 2018-07-15 ENCOUNTER — Ambulatory Visit (HOSPITAL_COMMUNITY): Payer: Medicare Other | Admitting: Hematology

## 2018-09-14 DIAGNOSIS — H40002 Preglaucoma, unspecified, left eye: Secondary | ICD-10-CM | POA: Diagnosis not present

## 2018-09-14 DIAGNOSIS — Z961 Presence of intraocular lens: Secondary | ICD-10-CM | POA: Diagnosis not present

## 2018-09-14 DIAGNOSIS — H402213 Chronic angle-closure glaucoma, right eye, severe stage: Secondary | ICD-10-CM | POA: Diagnosis not present

## 2018-09-14 DIAGNOSIS — H548 Legal blindness, as defined in USA: Secondary | ICD-10-CM | POA: Diagnosis not present

## 2018-10-13 ENCOUNTER — Ambulatory Visit (HOSPITAL_COMMUNITY)
Admission: RE | Admit: 2018-10-13 | Discharge: 2018-10-13 | Disposition: A | Discharge status: Home / Self Care | Payer: Medicare Other | Source: Ambulatory Visit | Attending: Hematology | Admitting: Hematology

## 2018-10-13 ENCOUNTER — Other Ambulatory Visit: Payer: Self-pay

## 2018-10-13 ENCOUNTER — Inpatient Hospital Stay (HOSPITAL_COMMUNITY): Payer: Medicare Other | Attending: Hematology

## 2018-10-13 DIAGNOSIS — Z79899 Other long term (current) drug therapy: Secondary | ICD-10-CM | POA: Insufficient documentation

## 2018-10-13 DIAGNOSIS — C9000 Multiple myeloma not having achieved remission: Secondary | ICD-10-CM | POA: Diagnosis not present

## 2018-10-13 DIAGNOSIS — Z8579 Personal history of other malignant neoplasms of lymphoid, hematopoietic and related tissues: Secondary | ICD-10-CM | POA: Diagnosis not present

## 2018-10-13 DIAGNOSIS — I739 Peripheral vascular disease, unspecified: Secondary | ICD-10-CM | POA: Diagnosis not present

## 2018-10-13 DIAGNOSIS — Z7982 Long term (current) use of aspirin: Secondary | ICD-10-CM | POA: Diagnosis not present

## 2018-10-13 DIAGNOSIS — D472 Monoclonal gammopathy: Secondary | ICD-10-CM

## 2018-10-13 DIAGNOSIS — Z7984 Long term (current) use of oral hypoglycemic drugs: Secondary | ICD-10-CM | POA: Insufficient documentation

## 2018-10-13 DIAGNOSIS — N189 Chronic kidney disease, unspecified: Secondary | ICD-10-CM | POA: Insufficient documentation

## 2018-10-13 LAB — CBC WITH DIFFERENTIAL/PLATELET
Abs Immature Granulocytes: 0.01 10*3/uL (ref 0.00–0.07)
Basophils Absolute: 0 10*3/uL (ref 0.0–0.1)
Basophils Relative: 1 %
Eosinophils Absolute: 0.1 10*3/uL (ref 0.0–0.5)
Eosinophils Relative: 4 %
HCT: 37.4 % — ABNORMAL LOW (ref 39.0–52.0)
Hemoglobin: 12.7 g/dL — ABNORMAL LOW (ref 13.0–17.0)
Immature Granulocytes: 0 %
Lymphocytes Relative: 35 %
Lymphs Abs: 1.3 10*3/uL (ref 0.7–4.0)
MCH: 31.6 pg (ref 26.0–34.0)
MCHC: 34 g/dL (ref 30.0–36.0)
MCV: 93 fL (ref 80.0–100.0)
Monocytes Absolute: 0.4 10*3/uL (ref 0.1–1.0)
Monocytes Relative: 10 %
Neutro Abs: 1.9 10*3/uL (ref 1.7–7.7)
Neutrophils Relative %: 50 %
Platelets: 197 10*3/uL (ref 150–400)
RBC: 4.02 MIL/uL — ABNORMAL LOW (ref 4.22–5.81)
RDW: 12.9 % (ref 11.5–15.5)
WBC: 3.7 10*3/uL — ABNORMAL LOW (ref 4.0–10.5)
nRBC: 0 % (ref 0.0–0.2)

## 2018-10-13 LAB — COMPREHENSIVE METABOLIC PANEL
ALT: 22 U/L (ref 0–44)
AST: 25 U/L (ref 15–41)
Albumin: 4 g/dL (ref 3.5–5.0)
Alkaline Phosphatase: 68 U/L (ref 38–126)
Anion gap: 9 (ref 5–15)
BUN: 18 mg/dL (ref 8–23)
CO2: 27 mmol/L (ref 22–32)
Calcium: 8.8 mg/dL — ABNORMAL LOW (ref 8.9–10.3)
Chloride: 98 mmol/L (ref 98–111)
Creatinine, Ser: 1.37 mg/dL — ABNORMAL HIGH (ref 0.61–1.24)
GFR calc Af Amer: 54 mL/min — ABNORMAL LOW (ref >60)
GFR calc non Af Amer: 47 mL/min — ABNORMAL LOW (ref >60)
Glucose, Bld: 246 mg/dL — ABNORMAL HIGH (ref 70–99)
Potassium: 3.4 mmol/L — ABNORMAL LOW (ref 3.5–5.1)
Sodium: 134 mmol/L — ABNORMAL LOW (ref 135–145)
Total Bilirubin: 1 mg/dL (ref 0.3–1.2)
Total Protein: 8 g/dL (ref 6.5–8.1)

## 2018-10-13 LAB — LACTATE DEHYDROGENASE: LDH: 139 U/L (ref 98–192)

## 2018-10-14 LAB — PROTEIN ELECTROPHORESIS, SERUM
A/G Ratio: 1.1 (ref 0.7–1.7)
Albumin ELP: 4 g/dL (ref 2.9–4.4)
Alpha-1-Globulin: 0.2 g/dL (ref 0.0–0.4)
Alpha-2-Globulin: 0.8 g/dL (ref 0.4–1.0)
Beta Globulin: 0.7 g/dL (ref 0.7–1.3)
Gamma Globulin: 1.9 g/dL — ABNORMAL HIGH (ref 0.4–1.8)
Globulin, Total: 3.6 g/dL (ref 2.2–3.9)
M-Spike, %: 1.4 g/dL — ABNORMAL HIGH
Total Protein ELP: 7.6 g/dL (ref 6.0–8.5)

## 2018-10-14 LAB — KAPPA/LAMBDA LIGHT CHAINS
Kappa free light chain: 25.3 mg/L — ABNORMAL HIGH (ref 3.3–19.4)
Kappa, lambda light chain ratio: 1.9 — ABNORMAL HIGH (ref 0.26–1.65)
Lambda free light chains: 13.3 mg/L (ref 5.7–26.3)

## 2018-10-19 DIAGNOSIS — N411 Chronic prostatitis: Secondary | ICD-10-CM | POA: Diagnosis not present

## 2018-10-19 DIAGNOSIS — E1169 Type 2 diabetes mellitus with other specified complication: Secondary | ICD-10-CM | POA: Diagnosis not present

## 2018-10-19 DIAGNOSIS — E785 Hyperlipidemia, unspecified: Secondary | ICD-10-CM | POA: Diagnosis not present

## 2018-10-19 DIAGNOSIS — E1129 Type 2 diabetes mellitus with other diabetic kidney complication: Secondary | ICD-10-CM | POA: Diagnosis not present

## 2018-10-19 DIAGNOSIS — I10 Essential (primary) hypertension: Secondary | ICD-10-CM | POA: Diagnosis not present

## 2018-10-19 LAB — HEPATIC FUNCTION PANEL
ALT: 29 (ref 10–40)
AST: 29 (ref 14–40)
Alkaline Phosphatase: 91 (ref 25–125)
Bilirubin, Total: 0.6

## 2018-10-19 LAB — BASIC METABOLIC PANEL
BUN: 18 (ref 4–21)
CO2: 28 — AB (ref 13–22)
Chloride: 96 — AB (ref 99–108)
Creatinine: 1.2 (ref <=1.3)
Glucose: 192
Potassium: 4.4 (ref 3.4–5.3)
Sodium: 138 (ref 137–147)

## 2018-10-19 LAB — PSA: PSA: 0.6

## 2018-10-19 LAB — LIPID PANEL
Cholesterol: 145 (ref 0–200)
HDL: 49 (ref 35–70)
LDL Cholesterol: 80
Triglycerides: 85 (ref 40–160)

## 2018-10-19 LAB — CBC AND DIFFERENTIAL
HCT: 38 — AB (ref 41–53)
Hemoglobin: 13.5 (ref 13.5–17.5)
Platelets: 124 — AB (ref 150–399)
WBC: 4

## 2018-10-19 LAB — HEMOGLOBIN A1C: Hemoglobin A1C: 7.4

## 2018-10-19 LAB — COMPREHENSIVE METABOLIC PANEL
Albumin: 4.9 (ref 3.5–5.0)
Calcium: 9.6 (ref 8.7–10.7)
GFR calc Af Amer: 64
GFR calc non Af Amer: 55

## 2018-10-19 LAB — CBC: RBC: 4.22 (ref 3.87–5.11)

## 2018-10-19 LAB — TSH: TSH: 4.02 (ref <=5.90)

## 2018-10-20 ENCOUNTER — Other Ambulatory Visit: Payer: Self-pay

## 2018-10-20 ENCOUNTER — Inpatient Hospital Stay (HOSPITAL_COMMUNITY): Payer: Medicare Other | Admitting: Hematology

## 2018-10-20 ENCOUNTER — Encounter (HOSPITAL_COMMUNITY): Payer: Self-pay | Admitting: Hematology

## 2018-10-20 DIAGNOSIS — Z7984 Long term (current) use of oral hypoglycemic drugs: Secondary | ICD-10-CM | POA: Diagnosis not present

## 2018-10-20 DIAGNOSIS — D472 Monoclonal gammopathy: Secondary | ICD-10-CM

## 2018-10-20 DIAGNOSIS — Z7982 Long term (current) use of aspirin: Secondary | ICD-10-CM | POA: Diagnosis not present

## 2018-10-20 DIAGNOSIS — C9000 Multiple myeloma not having achieved remission: Secondary | ICD-10-CM

## 2018-10-20 DIAGNOSIS — I739 Peripheral vascular disease, unspecified: Secondary | ICD-10-CM | POA: Diagnosis not present

## 2018-10-20 DIAGNOSIS — Z79899 Other long term (current) drug therapy: Secondary | ICD-10-CM | POA: Diagnosis not present

## 2018-10-20 DIAGNOSIS — N189 Chronic kidney disease, unspecified: Secondary | ICD-10-CM | POA: Diagnosis not present

## 2018-10-20 NOTE — Progress Notes (Signed)
Error

## 2018-10-20 NOTE — Assessment & Plan Note (Signed)
1.  IgG kappa smoldering myeloma: -Bone marrow biopsy on 07/19/2014 shows 10% plasma cells with normal cytogenetics and FISH panel. -CAT scan on 08/14/2014 was negative for lytic lesions. - We reviewed labs from 10/13/2018, M spike of 1.4 g/dL.  This was 1.7 g on 07/08/2018.  Hemoglobin is 12.7. -Free light chain ratio is 1.90, kappa light chains of 25.3.  Both have improved since 3 months ago.  LDH was normal.  Calcium is 8.8. - Skeletal survey from 10/13/2018 shows no lytic lesions. - He does not report any new onset bone pains.  Denies any fevers, night sweats or weight loss. Self and is able to do all ADLs and IADLs. - We will see him back in 6 months for follow-up.  2.  CKD: - This is stable between 1.2-1.5.

## 2018-10-20 NOTE — Progress Notes (Signed)
Patient Care Team: Sinda Du, MD as PCP - General (Pulmonary Disease)  DIAGNOSIS:  Encounter Diagnosis  Name Primary?  . Smoldering multiple myeloma (SMM) ([place])     SUMMARY OF ONCOLOGIC HISTORY: Oncology History  Smoldering multiple myeloma (SMM) ([place])  02/25/2014 Initial Diagnosis   Smoldering multiple myeloma (SMM) ([place])   06/25/2016 Imaging   Bone survey- 1. Stable exam. No significant lytic lesions identified. Exam unchanged from prior metastatic bone survey of 5:16 2017 .  2. Carotid, aortoiliac, peripheral vascular disease.     CHIEF COMPLIANT: Smoldering myeloma.  INTERVAL HISTORY: Maurice Cooper seen for follow-up of IgG kappa smoldering myeloma.  This was initially diagnosed in 2016 with bone marrow biopsy.  He denies any fevers, night sweats or weight loss in the last 3 months.  Denies any new onset bone pains.  Appetite is 75%.  Energy levels are 50%.  Denies any infections or hospitalizations.  REVIEW OF SYSTEMS:   Constitutional: Denies fevers, chills or abnormal weight loss Eyes: Denies blurriness of vision Ears, nose, mouth, throat, and face: Denies mucositis or sore throat Respiratory: Denies cough, dyspnea or wheezes Cardiovascular: Denies palpitation, chest discomfort Gastrointestinal:  Denies nausea, heartburn or change in bowel habits Skin: Denies abnormal skin rashes.  Positive for itching. Lymphatics: Denies new lymphadenopathy or easy bruising Neurological:Denies numbness, tingling or new weaknesses Behavioral/Psych: Mood is stable, no new changes  Extremities: No lower extremity edema All other systems were reviewed with the patient and are negative.  I have reviewed the past medical history, past surgical history, social history and family history with the patient and they are unchanged from previous note.  ALLERGIES:  has No Known Allergies.  MEDICATIONS:  Current Outpatient Medications  Medication Sig Dispense Refill  . amLODipine  (NORVASC) 10 MG tablet Take 10 mg by mouth daily.     Marland Kitchen aspirin EC 325 MG tablet Take 325 mg by mouth daily.    Marland Kitchen doxazosin (CARDURA) 4 MG tablet Take 4 mg by mouth daily.   1  . DOXAZOSIN MESYLATE PO Take 4 mg by mouth daily.    . finasteride (PROSCAR) 5 MG tablet Take 5 mg by mouth daily.  0  . glipiZIDE (GLUCOTROL XL) 5 MG 24 hr tablet Take 5 mg by mouth daily with breakfast.     . metoprolol (LOPRESSOR) 100 MG tablet Take 100 mg by mouth 2 (two) times daily.    . Multiple Vitamins-Minerals (CENTRUM SILVER PO) Take 1 capsule by mouth daily.    . potassium chloride SA (K-DUR,KLOR-CON) 20 MEQ tablet Take 20 mEq by mouth daily.    . pravastatin (PRAVACHOL) 40 MG tablet Take 40 mg by mouth daily.    . timolol (TIMOPTIC) 0.5 % ophthalmic solution Place 1 drop into the right eye daily.   1  . valsartan-hydrochlorothiazide (DIOVAN-HCT) 160-12.5 MG per tablet Take 1 tablet by mouth daily.    . vitamin C (ASCORBIC ACID) 500 MG tablet Take 500 mg by mouth daily.     No current facility-administered medications for this visit.     PHYSICAL EXAMINATION: ECOG PERFORMANCE STATUS: 1 - Symptomatic but completely ambulatory  Vitals:   10/20/18 1537  BP: (!) 186/63  Pulse: (!) 55  Resp: 16  Temp: 97.7 F (36.5 C)  SpO2: 98%   Filed Weights   10/20/18 1537  Weight: 176 lb 3.2 oz (79.9 kg)    GENERAL:alert, no distress and comfortable SKIN: skin color, texture, turgor are normal, no rashes or significant lesions  EYES: normal, Conjunctiva are pink and non-injected, sclera clear OROPHARYNX:no mucositis, no erythema and lips, buccal mucosa, and tongue normal  NECK: supple, thyroid normal size, non-tender, without nodularity LYMPH:  no palpable lymphadenopathy in the cervical, axillary or inguinal LUNGS: clear to auscultation and percussion with normal breathing effort HEART: regular rate & rhythm and no murmurs and no lower extremity edema ABDOMEN:abdomen soft, non-tender and normal bowel  sounds MUSCULOSKELETAL:no cyanosis of digits and no clubbing   EXTREMITIES: No lower extremity edema   LABORATORY DATA:  I have reviewed the data as listed CMP Latest Ref Rng & Units 10/13/2018 07/08/2018 01/05/2018  Glucose 70 - 99 mg/dL 246(H) 153(H) 156(H)  BUN 8 - 23 mg/dL 18 29(H) 20  Creatinine 0.61 - 1.24 mg/dL 1.37(H) 1.53(H) 1.26(H)  Sodium 135 - 145 mmol/L 134(L) 139 137  Potassium 3.5 - 5.1 mmol/L 3.4(L) 3.6 3.3(L)  Chloride 98 - 111 mmol/L 98 101 100  CO2 22 - 32 mmol/L 27 29 29   Calcium 8.9 - 10.3 mg/dL 8.8(L) 9.3 9.3  Total Protein 6.5 - 8.1 g/dL 8.0 8.8(H) 9.0(H)  Total Bilirubin 0.3 - 1.2 mg/dL 1.0 0.7 0.6  Alkaline Phos 38 - 126 U/L 68 76 69  AST 15 - 41 U/L 25 23 26   ALT 0 - 44 U/L 22 21 23    No results found for: [id number]   Lab Results  Component Value Date   WBC 3.7 (L) 10/13/2018   HGB 12.7 (L) 10/13/2018   HCT 37.4 (L) 10/13/2018   MCV 93.0 10/13/2018   PLT 197 10/13/2018   NEUTROABS 1.9 10/13/2018    ASSESSMENT & PLAN:  Smoldering multiple myeloma (SMM) 1.  IgG kappa smoldering myeloma: -Bone marrow biopsy on 07/19/2014 shows 10% plasma cells with normal cytogenetics and FISH panel. -CAT scan on 08/14/2014 was negative for lytic lesions. - We reviewed labs from 10/13/2018, M spike of 1.4 g/dL.  This was 1.7 g on 07/08/2018.  Hemoglobin is 12.7. -Free light chain ratio is 1.90, kappa light chains of 25.3.  Both have improved since 3 months ago.  LDH was normal.  Calcium is 8.8. - Skeletal survey from 10/13/2018 shows no lytic lesions. - He does not report any new onset bone pains.  Denies any fevers, night sweats or weight loss. Self and is able to do all ADLs and IADLs. - We will see him back in 6 months for follow-up.  2.  CKD: - This is stable between 1.2-1.5.    Orders Placed This Encounter  Procedures  . CBC with Differential/Platelet    Standing Status:   Future    Standing Expiration Date:   10/20/2019  . Comprehensive metabolic panel     Standing Status:   Future    Standing Expiration Date:   10/20/2019  . Protein electrophoresis, serum    Standing Status:   Future    Standing Expiration Date:   10/20/2019  . Kappa/lambda light chains    Standing Status:   Future    Standing Expiration Date:   10/20/2019  . Lactate dehydrogenase    Standing Status:   Future    Standing Expiration Date:   10/20/2019   The patient has a good understanding of the overall plan. he agrees with it. he will call with any problems that may develop before the next visit here.   Derek Jack, MD 10/20/18

## 2018-10-20 NOTE — Patient Instructions (Signed)
[facility] Discharge Instructions  You were seen today by Dr. Katragadda. He went over your recent lab results. He will see you back in 6 months for labs and follow up.   Thank you for choosing [facility] at [facility] to provide your oncology and hematology care.  To afford each patient quality time with our provider, please arrive at least 15 minutes before your scheduled appointment time.   If you have a lab appointment with the Cancer Center please come in thru the  Main Entrance and check in at the main information desk  You need to re-schedule your appointment should you arrive 10 or more minutes late.  We strive to give you quality time with our providers, and arriving late affects you and other patients whose appointments are after yours.  Also, if you no show three or more times for appointments you may be dismissed from the clinic at the providers discretion.     Again, thank you for choosing [facility].  Our hope is that these requests will decrease the amount of time that you wait before being seen by our physicians.       _____________________________________________________________  Should you have questions after your visit to [facility], please contact our office at [phone] between the hours of 8:00 a.m. and 4:30 p.m.  Voicemails left after 4:00 p.m. will not be returned until the following business day.  For prescription refill requests, have your pharmacy contact our office and allow 72 hours.    Cancer Center Support Programs:   > Cancer Support Group  2nd Tuesday of the month 1pm-2pm, Journey Room    

## 2018-10-25 DIAGNOSIS — Z0000 Encounter for general adult medical examination without abnormal findings: Secondary | ICD-10-CM | POA: Diagnosis not present

## 2018-10-25 DIAGNOSIS — Z23 Encounter for immunization: Secondary | ICD-10-CM | POA: Diagnosis not present

## 2018-10-25 LAB — HM DIABETES FOOT EXAM: HM Diabetic Foot Exam: NORMAL

## 2019-01-29 DIAGNOSIS — L989 Disorder of the skin and subcutaneous tissue, unspecified: Secondary | ICD-10-CM

## 2019-01-29 DIAGNOSIS — D472 Monoclonal gammopathy: Secondary | ICD-10-CM | POA: Insufficient documentation

## 2019-01-29 DIAGNOSIS — C001 Malignant neoplasm of external lower lip: Secondary | ICD-10-CM

## 2019-01-29 DIAGNOSIS — N182 Chronic kidney disease, stage 2 (mild): Secondary | ICD-10-CM

## 2019-01-29 DIAGNOSIS — C9000 Multiple myeloma not having achieved remission: Secondary | ICD-10-CM | POA: Insufficient documentation

## 2019-01-29 DIAGNOSIS — H409 Unspecified glaucoma: Secondary | ICD-10-CM | POA: Insufficient documentation

## 2019-03-08 DIAGNOSIS — H52202 Unspecified astigmatism, left eye: Secondary | ICD-10-CM | POA: Diagnosis not present

## 2019-03-08 DIAGNOSIS — H5202 Hypermetropia, left eye: Secondary | ICD-10-CM | POA: Diagnosis not present

## 2019-03-08 DIAGNOSIS — H40012 Open angle with borderline findings, low risk, left eye: Secondary | ICD-10-CM | POA: Diagnosis not present

## 2019-03-08 DIAGNOSIS — H402213 Chronic angle-closure glaucoma, right eye, severe stage: Secondary | ICD-10-CM | POA: Diagnosis not present

## 2019-03-08 DIAGNOSIS — H548 Legal blindness, as defined in USA: Secondary | ICD-10-CM | POA: Diagnosis not present

## 2019-03-13 DIAGNOSIS — Z08 Encounter for follow-up examination after completed treatment for malignant neoplasm: Secondary | ICD-10-CM | POA: Diagnosis not present

## 2019-03-13 DIAGNOSIS — Z1283 Encounter for screening for malignant neoplasm of skin: Secondary | ICD-10-CM | POA: Diagnosis not present

## 2019-03-13 DIAGNOSIS — Z85820 Personal history of malignant melanoma of skin: Secondary | ICD-10-CM | POA: Diagnosis not present

## 2019-03-13 DIAGNOSIS — D225 Melanocytic nevi of trunk: Secondary | ICD-10-CM | POA: Diagnosis not present

## 2019-03-13 DIAGNOSIS — L820 Inflamed seborrheic keratosis: Secondary | ICD-10-CM | POA: Diagnosis not present

## 2019-03-13 DIAGNOSIS — L570 Actinic keratosis: Secondary | ICD-10-CM | POA: Diagnosis not present

## 2019-03-13 DIAGNOSIS — I872 Venous insufficiency (chronic) (peripheral): Secondary | ICD-10-CM | POA: Diagnosis not present

## 2019-04-17 ENCOUNTER — Inpatient Hospital Stay (HOSPITAL_COMMUNITY): Payer: Medicare Other | Attending: Hematology

## 2019-04-17 ENCOUNTER — Other Ambulatory Visit: Payer: Self-pay

## 2019-04-17 DIAGNOSIS — E7800 Pure hypercholesterolemia, unspecified: Secondary | ICD-10-CM | POA: Diagnosis not present

## 2019-04-17 DIAGNOSIS — I129 Hypertensive chronic kidney disease with stage 1 through stage 4 chronic kidney disease, or unspecified chronic kidney disease: Secondary | ICD-10-CM | POA: Insufficient documentation

## 2019-04-17 DIAGNOSIS — C9000 Multiple myeloma not having achieved remission: Secondary | ICD-10-CM | POA: Insufficient documentation

## 2019-04-17 DIAGNOSIS — Z85820 Personal history of malignant melanoma of skin: Secondary | ICD-10-CM | POA: Diagnosis not present

## 2019-04-17 DIAGNOSIS — E1151 Type 2 diabetes mellitus with diabetic peripheral angiopathy without gangrene: Secondary | ICD-10-CM | POA: Insufficient documentation

## 2019-04-17 DIAGNOSIS — Z87891 Personal history of nicotine dependence: Secondary | ICD-10-CM | POA: Insufficient documentation

## 2019-04-17 DIAGNOSIS — Z7984 Long term (current) use of oral hypoglycemic drugs: Secondary | ICD-10-CM | POA: Diagnosis not present

## 2019-04-17 DIAGNOSIS — Z7982 Long term (current) use of aspirin: Secondary | ICD-10-CM | POA: Insufficient documentation

## 2019-04-17 DIAGNOSIS — N182 Chronic kidney disease, stage 2 (mild): Secondary | ICD-10-CM | POA: Diagnosis not present

## 2019-04-17 DIAGNOSIS — D472 Monoclonal gammopathy: Secondary | ICD-10-CM

## 2019-04-17 DIAGNOSIS — Z79899 Other long term (current) drug therapy: Secondary | ICD-10-CM | POA: Diagnosis not present

## 2019-04-17 DIAGNOSIS — E1122 Type 2 diabetes mellitus with diabetic chronic kidney disease: Secondary | ICD-10-CM | POA: Diagnosis not present

## 2019-04-17 LAB — CBC WITH DIFFERENTIAL/PLATELET
Abs Immature Granulocytes: 0 10*3/uL (ref 0.00–0.07)
Basophils Absolute: 0 10*3/uL (ref 0.0–0.1)
Basophils Relative: 1 %
Eosinophils Absolute: 0.2 10*3/uL (ref 0.0–0.5)
Eosinophils Relative: 5 %
HCT: 38.9 % — ABNORMAL LOW (ref 39.0–52.0)
Hemoglobin: 13 g/dL (ref 13.0–17.0)
Immature Granulocytes: 0 %
Lymphocytes Relative: 39 %
Lymphs Abs: 1.5 10*3/uL (ref 0.7–4.0)
MCH: 31.2 pg (ref 26.0–34.0)
MCHC: 33.4 g/dL (ref 30.0–36.0)
MCV: 93.3 fL (ref 80.0–100.0)
Monocytes Absolute: 0.3 10*3/uL (ref 0.1–1.0)
Monocytes Relative: 8 %
Neutro Abs: 1.9 10*3/uL (ref 1.7–7.7)
Neutrophils Relative %: 47 %
Platelets: 203 10*3/uL (ref 150–400)
RBC: 4.17 MIL/uL — ABNORMAL LOW (ref 4.22–5.81)
RDW: 13 % (ref 11.5–15.5)
WBC: 3.9 10*3/uL — ABNORMAL LOW (ref 4.0–10.5)
nRBC: 0 % (ref 0.0–0.2)

## 2019-04-17 LAB — COMPREHENSIVE METABOLIC PANEL
ALT: 23 U/L (ref 0–44)
AST: 23 U/L (ref 15–41)
Albumin: 4.3 g/dL (ref 3.5–5.0)
Alkaline Phosphatase: 74 U/L (ref 38–126)
Anion gap: 8 (ref 5–15)
BUN: 24 mg/dL — ABNORMAL HIGH (ref 8–23)
CO2: 29 mmol/L (ref 22–32)
Calcium: 9 mg/dL (ref 8.9–10.3)
Chloride: 99 mmol/L (ref 98–111)
Creatinine, Ser: 1.31 mg/dL — ABNORMAL HIGH (ref 0.61–1.24)
GFR calc Af Amer: 57 mL/min — ABNORMAL LOW (ref >60)
GFR calc non Af Amer: 49 mL/min — ABNORMAL LOW (ref >60)
Glucose, Bld: 247 mg/dL — ABNORMAL HIGH (ref 70–99)
Potassium: 3.6 mmol/L (ref 3.5–5.1)
Sodium: 136 mmol/L (ref 135–145)
Total Bilirubin: 0.7 mg/dL (ref 0.3–1.2)
Total Protein: 8.5 g/dL — ABNORMAL HIGH (ref 6.5–8.1)

## 2019-04-17 LAB — LACTATE DEHYDROGENASE: LDH: 132 U/L (ref 98–192)

## 2019-04-18 LAB — KAPPA/LAMBDA LIGHT CHAINS
Kappa free light chain: 24.3 mg/L — ABNORMAL HIGH (ref 3.3–19.4)
Kappa, lambda light chain ratio: 1.43 (ref 0.26–1.65)
Lambda free light chains: 17 mg/L (ref 5.7–26.3)

## 2019-04-19 LAB — PROTEIN ELECTROPHORESIS, SERUM
A/G Ratio: 1.1 (ref 0.7–1.7)
Albumin ELP: 4.1 g/dL (ref 2.9–4.4)
Alpha-1-Globulin: 0.2 g/dL (ref 0.0–0.4)
Alpha-2-Globulin: 0.7 g/dL (ref 0.4–1.0)
Beta Globulin: 0.8 g/dL (ref 0.7–1.3)
Gamma Globulin: 2 g/dL — ABNORMAL HIGH (ref 0.4–1.8)
Globulin, Total: 3.8 g/dL (ref 2.2–3.9)
M-Spike, %: 1.5 g/dL — ABNORMAL HIGH
Total Protein ELP: 7.9 g/dL (ref 6.0–8.5)

## 2019-04-24 ENCOUNTER — Inpatient Hospital Stay (HOSPITAL_COMMUNITY): Payer: Medicare Other | Admitting: Hematology

## 2019-04-24 ENCOUNTER — Encounter (HOSPITAL_COMMUNITY): Payer: Self-pay | Admitting: Hematology

## 2019-04-24 ENCOUNTER — Other Ambulatory Visit: Payer: Self-pay

## 2019-04-24 VITALS — BP 169/69 | HR 61 | Temp 97.1°F | Resp 19 | Wt 178.9 lb

## 2019-04-24 DIAGNOSIS — D472 Monoclonal gammopathy: Secondary | ICD-10-CM

## 2019-04-24 DIAGNOSIS — I129 Hypertensive chronic kidney disease with stage 1 through stage 4 chronic kidney disease, or unspecified chronic kidney disease: Secondary | ICD-10-CM | POA: Diagnosis not present

## 2019-04-24 DIAGNOSIS — E1122 Type 2 diabetes mellitus with diabetic chronic kidney disease: Secondary | ICD-10-CM | POA: Diagnosis not present

## 2019-04-24 DIAGNOSIS — Z7984 Long term (current) use of oral hypoglycemic drugs: Secondary | ICD-10-CM | POA: Diagnosis not present

## 2019-04-24 DIAGNOSIS — Z7982 Long term (current) use of aspirin: Secondary | ICD-10-CM | POA: Diagnosis not present

## 2019-04-24 DIAGNOSIS — C9000 Multiple myeloma not having achieved remission: Secondary | ICD-10-CM

## 2019-04-24 DIAGNOSIS — E7800 Pure hypercholesterolemia, unspecified: Secondary | ICD-10-CM | POA: Diagnosis not present

## 2019-04-24 DIAGNOSIS — Z87891 Personal history of nicotine dependence: Secondary | ICD-10-CM | POA: Diagnosis not present

## 2019-04-24 DIAGNOSIS — N182 Chronic kidney disease, stage 2 (mild): Secondary | ICD-10-CM | POA: Diagnosis not present

## 2019-04-24 DIAGNOSIS — E1151 Type 2 diabetes mellitus with diabetic peripheral angiopathy without gangrene: Secondary | ICD-10-CM | POA: Diagnosis not present

## 2019-04-24 DIAGNOSIS — Z85820 Personal history of malignant melanoma of skin: Secondary | ICD-10-CM | POA: Diagnosis not present

## 2019-04-24 DIAGNOSIS — Z79899 Other long term (current) drug therapy: Secondary | ICD-10-CM | POA: Diagnosis not present

## 2019-04-24 NOTE — Progress Notes (Signed)
Maurice Cooper, [place] [address]   CLINIC:  Medical Oncology/Hematology  PCP:  Maurice Du, MD No address on file None   REASON FOR VISIT:  Follow-up for smoldering myeloma.  CURRENT THERAPY: Active surveillance.  BRIEF ONCOLOGIC HISTORY:  Oncology History  Smoldering multiple myeloma (SMM) ([place])  02/25/2014 Initial Diagnosis   Smoldering multiple myeloma (SMM) ([place])   06/25/2016 Imaging   Bone survey- 1. Stable exam. No significant lytic lesions identified. Exam unchanged from prior metastatic bone survey of 5:16 2017 .  2. Carotid, aortoiliac, peripheral vascular disease.      CANCER STAGING: Cancer Staging No matching staging information was found for the patient.   INTERVAL HISTORY:  Maurice Cooper 86 y.o. male seen for follow-up of smoldering myeloma.  Reports appetite of 100%.  Energy levels are 75%.  Denies any ER visits or hospitalizations.  Denies any tingling or numbness in extremities.  No new onset bone pains.    REVIEW OF SYSTEMS:  Review of Systems  All other systems reviewed and are negative.    PAST MEDICAL/SURGICAL HISTORY:  Past Medical History:  Diagnosis Date  . Amputation of index finger   . Chronic kidney disease, stage 2 (mild)   . Diabetes ([place])   . Diabetes mellitus ([place])   . Disorder of the skin and subcutaneous tissue, unspecified   . Glaucoma   . High cholesterol   . Hypercholesteremia   . Hypertension   . Malignant neoplasm of external lower lip   . Melanoma ([place])    lower back  . Monoclonal gammopathy   . Multiple myeloma not having achieved remission ([place])   . Other pancytopenia ([place]) 02/25/2014  . Personal history of other malignant neoplasm of skin   . Smoldering multiple myeloma (SMM) ([place]) 02/25/2014  . Unspecified glaucoma    Past Surgical History:  Procedure Laterality Date  . AMPUTATION FINGER / THUMB Left    forefinger  . BONE MARROW ASPIRATION Left 07/19/14  . BONE MARROW  BIOPSY Left 07/19/14  . CATARACT EXTRACTION Bilateral   . CORNEAL TRANSPLANT Right   . MELANOMA EXCISION Left    left flank area 1980s  . TONSILLECTOMY       SOCIAL HISTORY:  Social History   Socioeconomic History  . Marital status: Widowed    Spouse name: Not on file  . Number of children: Not on file  . Years of education: Not on file  . Highest education level: Not on file  Occupational History  . Not on file  Tobacco Use  . Smoking status: Former Smoker    Types: Cigarettes  . Smokeless tobacco: Former Systems developer    Types: Chew  Substance and Sexual Activity  . Alcohol use: No  . Drug use: No  . Sexual activity: Not on file  Other Topics Concern  . Not on file  Social History Narrative  . Not on file   Social Determinants of Health   Financial Resource Strain:   . Difficulty of Paying Living Expenses:   Food Insecurity:   . Worried About Charity fundraiser in the Last Year:   . Arboriculturist in the Last Year:   Transportation Needs:   . Film/video editor (Medical):   Marland Kitchen Lack of Transportation (Non-Medical):   Physical Activity:   . Days of Exercise per Week:   . Minutes of Exercise per Session:   Stress:   . Feeling of Stress :  Social Connections:   . Frequency of Communication with Friends and Family:   . Frequency of Social Gatherings with Friends and Family:   . Attends Religious Services:   . Active Member of Clubs or Organizations:   . Attends Archivist Meetings:   Marland Kitchen Marital Status:   Intimate Partner Violence:   . Fear of Current or Ex-Partner:   . Emotionally Abused:   Marland Kitchen Physically Abused:   . Sexually Abused:     FAMILY HISTORY:  Family History  Problem Relation Age of Onset  . Cancer Mother   . Stroke Father     CURRENT MEDICATIONS:  Outpatient Encounter Medications as of 04/24/2019  Medication Sig  . amLODipine (NORVASC) 10 MG tablet Take 10 mg by mouth daily.   Marland Kitchen aspirin EC 325 MG tablet Take 325 mg by mouth daily.    Marland Kitchen doxazosin (CARDURA) 4 MG tablet Take 4 mg by mouth daily.   . finasteride (PROSCAR) 5 MG tablet Take 5 mg by mouth daily.  Marland Kitchen glipiZIDE (GLUCOTROL XL) 5 MG 24 hr tablet Take 5 mg by mouth daily with breakfast.   . metoprolol (LOPRESSOR) 100 MG tablet Take 100 mg by mouth 2 (two) times daily.  . Multiple Vitamins-Minerals (CENTRUM SILVER PO) Take 1 capsule by mouth daily.  . potassium chloride SA (K-DUR,KLOR-CON) 20 MEQ tablet Take 20 mEq by mouth daily.  . pravastatin (PRAVACHOL) 40 MG tablet Take 40 mg by mouth daily.  . timolol (TIMOPTIC) 0.5 % ophthalmic solution Place 1 drop into the right eye daily.   . valsartan-hydrochlorothiazide (DIOVAN-HCT) 160-12.5 MG per tablet Take 1 tablet by mouth daily.  . [DISCONTINUED] vitamin C (ASCORBIC ACID) 500 MG tablet Take 500 mg by mouth daily.  Marland Kitchen ketoconazole (NIZORAL) 2 % cream Apply 1 application topically 2 (two) times daily. Apply to feet  . [DISCONTINUED] DOXAZOSIN MESYLATE PO Take 4 mg by mouth daily.   No facility-administered encounter medications on file as of 04/24/2019.    ALLERGIES:  No Known Allergies   PHYSICAL EXAM:  ECOG Performance status: 1  Vitals:   04/24/19 1116  BP: (!) 169/69  Pulse: 61  Resp: 19  Temp: (!) 97.1 F (36.2 C)  SpO2: 99%   Filed Weights   04/24/19 1116  Weight: 178 lb 14.4 oz (81.1 kg)    Physical Exam Vitals reviewed.  Constitutional:      Appearance: Normal appearance.  Cardiovascular:     Rate and Rhythm: Normal rate and regular rhythm.     Heart sounds: Normal heart sounds.  Pulmonary:     Breath sounds: Normal breath sounds.  Abdominal:     General: There is no distension.     Palpations: Abdomen is soft. There is no mass.  Lymphadenopathy:     Cervical: No cervical adenopathy.  Skin:    General: Skin is warm.  Neurological:     General: No focal deficit present.     Mental Status: He is alert and oriented to person, place, and time.  Psychiatric:        Mood and Affect:  Mood normal.        Behavior: Behavior normal.      LABORATORY DATA:  I have reviewed the labs as listed.  CBC    Component Value Date/Time   WBC 3.9 (L) 04/17/2019 1436   RBC 4.17 (L) 04/17/2019 1436   HGB 13.0 04/17/2019 1436   HCT 38.9 (L) 04/17/2019 1436   PLT 203 04/17/2019 1436  MCV 93.3 04/17/2019 1436   MCH 31.2 04/17/2019 1436   MCHC 33.4 04/17/2019 1436   RDW 13.0 04/17/2019 1436   LYMPHSABS 1.5 04/17/2019 1436   MONOABS 0.3 04/17/2019 1436   EOSABS 0.2 04/17/2019 1436   BASOSABS 0.0 04/17/2019 1436   CMP Latest Ref Rng & Units 04/17/2019 10/19/2018 10/13/2018  Glucose 70 - 99 mg/dL 247(H) - 246(H)  BUN 8 - 23 mg/dL 24(H) 18 18  Creatinine 0.61 - 1.24 mg/dL 1.31(H) 1.2 1.37(H)  Sodium 135 - 145 mmol/L 136 138 134(L)  Potassium 3.5 - 5.1 mmol/L 3.6 4.4 3.4(L)  Chloride 98 - 111 mmol/L 99 96(A) 98  CO2 22 - 32 mmol/L 29 28(A) 27  Calcium 8.9 - 10.3 mg/dL 9.0 9.6 8.8(L)  Total Protein 6.5 - 8.1 g/dL 8.5(H) - 8.0  Total Bilirubin 0.3 - 1.2 mg/dL 0.7 - 1.0  Alkaline Phos 38 - 126 U/L 74 91 68  AST 15 - 41 U/L _0 ALT 0 - 44 U/L _1 DIAGNOSTIC IMAGING:  I have independently reviewed the scans and discussed with the patient.     ASSESSMENT & PLAN:   Smoldering multiple myeloma (SMM) 1.  IgG kappa smoldering myeloma: -Bone marrow biopsy on 07/19/2014 shows 10% plasma cells with normal cytogenetics and FISH panel. -PET scan on 08/14/2014 was negative for lytic lesions.  Subsequent CT scan of the chest in 2018 was also negative. -We reviewed labs from 04/17/2019.  M spike is stable around 1.5 g.  Free kappa light chains are 24.3, ratio is 1.43 and stable. -Creatinine is 1.31 and calcium is 9.  Hemoglobin is 13. -Last skeletal survey on 10/13/2018 did not show any lytic lesions. -He does not report any recurrent infections.  No new onset bone pains. -I will see him back in 6 months for follow-up.  We plan to repeat myeloma panel and skeletal  survey prior to next visit.  2.  CKD: -His baseline is between 1.2-1.5 creatinine.  Latest creatinine is 1.31.      Orders placed this encounter:  Orders Placed This Encounter  Procedures  . DG Bone Survey Met  . CBC with Differential/Platelet  . Comprehensive metabolic panel  . Protein electrophoresis, serum  . Kappa/lambda light chains  . Lactate dehydrogenase      Derek Jack, MD [place] [phone]

## 2019-04-24 NOTE — Patient Instructions (Signed)
[place] at [facility] Discharge Instructions  You were seen today by Dr. Delton Coombes. He went over your recent lab results. He will see you back in 6 months for labs, bone survey and follow up.   Thank you for choosing [place] at [facility] to provide your oncology and hematology care.  To afford each patient quality time with our provider, please arrive at least 15 minutes before your scheduled appointment time.   If you have a lab appointment with the [facility] please come in thru the  Main Entrance and check in at the main information desk  You need to re-schedule your appointment should you arrive 10 or more minutes late.  We strive to give you quality time with our providers, and arriving late affects you and other patients whose appointments are after yours.  Also, if you no show three or more times for appointments you may be dismissed from the clinic at the providers discretion.     Again, thank you for choosing [facility].  Our hope is that these requests will decrease the amount of time that you wait before being seen by our physicians.       _____________________________________________________________  Should you have questions after your visit to [facility], please contact our office at [phone] between the hours of 8:00 a.m. and 4:30 p.m.  Voicemails left after 4:00 p.m. will not be returned until the following business day.  For prescription refill requests, have your pharmacy contact our office and allow 72 hours.    Cancer Center Support Programs:   > Cancer Support Group  2nd Tuesday of the month 1pm-2pm, Journey Room

## 2019-04-24 NOTE — Assessment & Plan Note (Signed)
1.  IgG kappa smoldering myeloma: -Bone marrow biopsy on 07/19/2014 shows 10% plasma cells with normal cytogenetics and FISH panel. -PET scan on 08/14/2014 was negative for lytic lesions.  Subsequent CT scan of the chest in 2018 was also negative. -We reviewed labs from 04/17/2019.  M spike is stable around 1.5 g.  Free kappa light chains are 24.3, ratio is 1.43 and stable. -Creatinine is 1.31 and calcium is 9.  Hemoglobin is 13. -Last skeletal survey on 10/13/2018 did not show any lytic lesions. -He does not report any recurrent infections.  No new onset bone pains. -I will see him back in 6 months for follow-up.  We plan to repeat myeloma panel and skeletal survey prior to next visit.  2.  CKD: -His baseline is between 1.2-1.5 creatinine.  Latest creatinine is 1.31.

## 2019-04-26 ENCOUNTER — Other Ambulatory Visit: Payer: Self-pay

## 2019-04-26 NOTE — Patient Outreach (Signed)
[facility]) Care Management  04/26/2019  Maurice Cooper 01/15/1932 [id number]   Medication Adherence call to Maurice Cooper Telephone call to Patient regarding Medication Adherence unable to reach patient,patient did not answer. Maurice Cooper is showing past due on Valsartan/Hctz 160/12.5 mg under [place].   [place] Management Direct Dial [phone]  Fax [phone] [email or web address]

## 2019-04-27 DIAGNOSIS — H547 Unspecified visual loss: Secondary | ICD-10-CM | POA: Diagnosis not present

## 2019-04-27 DIAGNOSIS — I10 Essential (primary) hypertension: Secondary | ICD-10-CM | POA: Diagnosis not present

## 2019-04-27 DIAGNOSIS — E119 Type 2 diabetes mellitus without complications: Secondary | ICD-10-CM | POA: Diagnosis not present

## 2019-04-27 DIAGNOSIS — E785 Hyperlipidemia, unspecified: Secondary | ICD-10-CM | POA: Diagnosis not present

## 2019-05-29 DIAGNOSIS — I10 Essential (primary) hypertension: Secondary | ICD-10-CM | POA: Diagnosis not present

## 2019-05-29 DIAGNOSIS — E119 Type 2 diabetes mellitus without complications: Secondary | ICD-10-CM | POA: Diagnosis not present

## 2019-05-29 DIAGNOSIS — E785 Hyperlipidemia, unspecified: Secondary | ICD-10-CM | POA: Diagnosis not present

## 2019-05-31 DIAGNOSIS — I10 Essential (primary) hypertension: Secondary | ICD-10-CM | POA: Diagnosis not present

## 2019-05-31 DIAGNOSIS — E039 Hypothyroidism, unspecified: Secondary | ICD-10-CM | POA: Diagnosis not present

## 2019-05-31 DIAGNOSIS — Z0001 Encounter for general adult medical examination with abnormal findings: Secondary | ICD-10-CM | POA: Diagnosis not present

## 2019-05-31 DIAGNOSIS — E785 Hyperlipidemia, unspecified: Secondary | ICD-10-CM | POA: Diagnosis not present

## 2019-06-08 ENCOUNTER — Ambulatory Visit: Payer: Medicare Other | Admitting: Family Medicine

## 2019-06-12 ENCOUNTER — Ambulatory Visit: Payer: Medicare Other | Admitting: Family Medicine

## 2019-06-12 DIAGNOSIS — B078 Other viral warts: Secondary | ICD-10-CM | POA: Diagnosis not present

## 2019-06-12 DIAGNOSIS — X32XXXD Exposure to sunlight, subsequent encounter: Secondary | ICD-10-CM | POA: Diagnosis not present

## 2019-06-12 DIAGNOSIS — L821 Other seborrheic keratosis: Secondary | ICD-10-CM | POA: Diagnosis not present

## 2019-06-12 DIAGNOSIS — C44622 Squamous cell carcinoma of skin of right upper limb, including shoulder: Secondary | ICD-10-CM | POA: Diagnosis not present

## 2019-06-12 DIAGNOSIS — L570 Actinic keratosis: Secondary | ICD-10-CM | POA: Diagnosis not present

## 2019-07-28 DIAGNOSIS — N390 Urinary tract infection, site not specified: Secondary | ICD-10-CM | POA: Diagnosis not present

## 2019-07-28 DIAGNOSIS — E039 Hypothyroidism, unspecified: Secondary | ICD-10-CM | POA: Diagnosis not present

## 2019-08-02 DIAGNOSIS — E1165 Type 2 diabetes mellitus with hyperglycemia: Secondary | ICD-10-CM | POA: Diagnosis not present

## 2019-08-02 DIAGNOSIS — E7849 Other hyperlipidemia: Secondary | ICD-10-CM | POA: Diagnosis not present

## 2019-08-02 DIAGNOSIS — E039 Hypothyroidism, unspecified: Secondary | ICD-10-CM | POA: Diagnosis not present

## 2019-08-02 DIAGNOSIS — I10 Essential (primary) hypertension: Secondary | ICD-10-CM | POA: Diagnosis not present

## 2019-08-23 DIAGNOSIS — Z08 Encounter for follow-up examination after completed treatment for malignant neoplasm: Secondary | ICD-10-CM | POA: Diagnosis not present

## 2019-08-23 DIAGNOSIS — Z85828 Personal history of other malignant neoplasm of skin: Secondary | ICD-10-CM | POA: Diagnosis not present

## 2019-08-23 DIAGNOSIS — D0462 Carcinoma in situ of skin of left upper limb, including shoulder: Secondary | ICD-10-CM | POA: Diagnosis not present

## 2019-08-23 DIAGNOSIS — C44622 Squamous cell carcinoma of skin of right upper limb, including shoulder: Secondary | ICD-10-CM | POA: Diagnosis not present

## 2019-08-23 DIAGNOSIS — D034 Melanoma in situ of scalp and neck: Secondary | ICD-10-CM | POA: Diagnosis not present

## 2019-08-23 DIAGNOSIS — Z1283 Encounter for screening for malignant neoplasm of skin: Secondary | ICD-10-CM | POA: Diagnosis not present

## 2019-08-31 DIAGNOSIS — L988 Other specified disorders of the skin and subcutaneous tissue: Secondary | ICD-10-CM | POA: Diagnosis not present

## 2019-08-31 DIAGNOSIS — D034 Melanoma in situ of scalp and neck: Secondary | ICD-10-CM | POA: Diagnosis not present

## 2019-08-31 DIAGNOSIS — D0339 Melanoma in situ of other parts of face: Secondary | ICD-10-CM | POA: Diagnosis not present

## 2019-09-15 DIAGNOSIS — H402213 Chronic angle-closure glaucoma, right eye, severe stage: Secondary | ICD-10-CM | POA: Diagnosis not present

## 2019-09-15 DIAGNOSIS — H40012 Open angle with borderline findings, low risk, left eye: Secondary | ICD-10-CM | POA: Diagnosis not present

## 2019-09-15 DIAGNOSIS — H548 Legal blindness, as defined in USA: Secondary | ICD-10-CM | POA: Diagnosis not present

## 2019-09-15 DIAGNOSIS — Z961 Presence of intraocular lens: Secondary | ICD-10-CM | POA: Diagnosis not present

## 2019-09-25 DIAGNOSIS — D0461 Carcinoma in situ of skin of right upper limb, including shoulder: Secondary | ICD-10-CM | POA: Diagnosis not present

## 2019-09-25 DIAGNOSIS — D0339 Melanoma in situ of other parts of face: Secondary | ICD-10-CM | POA: Diagnosis not present

## 2019-09-25 DIAGNOSIS — D045 Carcinoma in situ of skin of trunk: Secondary | ICD-10-CM | POA: Diagnosis not present

## 2019-09-29 DIAGNOSIS — E1165 Type 2 diabetes mellitus with hyperglycemia: Secondary | ICD-10-CM | POA: Diagnosis not present

## 2019-09-29 DIAGNOSIS — E039 Hypothyroidism, unspecified: Secondary | ICD-10-CM | POA: Diagnosis not present

## 2019-09-29 DIAGNOSIS — I10 Essential (primary) hypertension: Secondary | ICD-10-CM | POA: Diagnosis not present

## 2019-10-03 DIAGNOSIS — E7849 Other hyperlipidemia: Secondary | ICD-10-CM | POA: Diagnosis not present

## 2019-10-03 DIAGNOSIS — E1165 Type 2 diabetes mellitus with hyperglycemia: Secondary | ICD-10-CM | POA: Diagnosis not present

## 2019-10-03 DIAGNOSIS — E039 Hypothyroidism, unspecified: Secondary | ICD-10-CM | POA: Diagnosis not present

## 2019-10-03 DIAGNOSIS — I10 Essential (primary) hypertension: Secondary | ICD-10-CM | POA: Diagnosis not present

## 2019-10-03 DIAGNOSIS — Z79899 Other long term (current) drug therapy: Secondary | ICD-10-CM | POA: Diagnosis not present

## 2019-10-19 ENCOUNTER — Other Ambulatory Visit: Payer: Self-pay

## 2019-10-19 ENCOUNTER — Inpatient Hospital Stay (HOSPITAL_COMMUNITY): Payer: Medicare Other | Attending: Hematology

## 2019-10-19 ENCOUNTER — Ambulatory Visit (HOSPITAL_COMMUNITY)
Admission: RE | Admit: 2019-10-19 | Discharge: 2019-10-19 | Disposition: A | Discharge status: Home / Self Care | Payer: Medicare Other | Source: Ambulatory Visit | Attending: Hematology | Admitting: Hematology

## 2019-10-19 DIAGNOSIS — Z809 Family history of malignant neoplasm, unspecified: Secondary | ICD-10-CM | POA: Diagnosis not present

## 2019-10-19 DIAGNOSIS — C9000 Multiple myeloma not having achieved remission: Secondary | ICD-10-CM | POA: Insufficient documentation

## 2019-10-19 DIAGNOSIS — Z823 Family history of stroke: Secondary | ICD-10-CM | POA: Insufficient documentation

## 2019-10-19 DIAGNOSIS — N182 Chronic kidney disease, stage 2 (mild): Secondary | ICD-10-CM | POA: Insufficient documentation

## 2019-10-19 DIAGNOSIS — D472 Monoclonal gammopathy: Secondary | ICD-10-CM

## 2019-10-19 DIAGNOSIS — Z85820 Personal history of malignant melanoma of skin: Secondary | ICD-10-CM | POA: Insufficient documentation

## 2019-10-19 DIAGNOSIS — M47816 Spondylosis without myelopathy or radiculopathy, lumbar region: Secondary | ICD-10-CM | POA: Diagnosis not present

## 2019-10-19 DIAGNOSIS — Z87891 Personal history of nicotine dependence: Secondary | ICD-10-CM | POA: Insufficient documentation

## 2019-10-19 DIAGNOSIS — R5383 Other fatigue: Secondary | ICD-10-CM | POA: Insufficient documentation

## 2019-10-19 DIAGNOSIS — Z79899 Other long term (current) drug therapy: Secondary | ICD-10-CM | POA: Diagnosis not present

## 2019-10-19 LAB — COMPREHENSIVE METABOLIC PANEL
ALT: 24 U/L (ref 0–44)
AST: 23 U/L (ref 15–41)
Albumin: 4.2 g/dL (ref 3.5–5.0)
Alkaline Phosphatase: 67 U/L (ref 38–126)
Anion gap: 8 (ref 5–15)
BUN: 15 mg/dL (ref 8–23)
CO2: 28 mmol/L (ref 22–32)
Calcium: 9.2 mg/dL (ref 8.9–10.3)
Chloride: 97 mmol/L — ABNORMAL LOW (ref 98–111)
Creatinine, Ser: 1.15 mg/dL (ref 0.61–1.24)
GFR calc Af Amer: >60 mL/min (ref >60)
GFR calc non Af Amer: 57 mL/min — ABNORMAL LOW (ref >60)
Glucose, Bld: 190 mg/dL — ABNORMAL HIGH (ref 70–99)
Potassium: 3.6 mmol/L (ref 3.5–5.1)
Sodium: 133 mmol/L — ABNORMAL LOW (ref 135–145)
Total Bilirubin: 0.7 mg/dL (ref 0.3–1.2)
Total Protein: 8.4 g/dL — ABNORMAL HIGH (ref 6.5–8.1)

## 2019-10-19 LAB — CBC WITH DIFFERENTIAL/PLATELET
Abs Immature Granulocytes: 0 10*3/uL (ref 0.00–0.07)
Basophils Absolute: 0 10*3/uL (ref 0.0–0.1)
Basophils Relative: 1 %
Eosinophils Absolute: 0.2 10*3/uL (ref 0.0–0.5)
Eosinophils Relative: 5 %
HCT: 38.9 % — ABNORMAL LOW (ref 39.0–52.0)
Hemoglobin: 12.9 g/dL — ABNORMAL LOW (ref 13.0–17.0)
Immature Granulocytes: 0 %
Lymphocytes Relative: 30 %
Lymphs Abs: 1.1 10*3/uL (ref 0.7–4.0)
MCH: 30.5 pg (ref 26.0–34.0)
MCHC: 33.2 g/dL (ref 30.0–36.0)
MCV: 92 fL (ref 80.0–100.0)
Monocytes Absolute: 0.4 10*3/uL (ref 0.1–1.0)
Monocytes Relative: 9 %
Neutro Abs: 2.1 10*3/uL (ref 1.7–7.7)
Neutrophils Relative %: 55 %
Platelets: 194 10*3/uL (ref 150–400)
RBC: 4.23 MIL/uL (ref 4.22–5.81)
RDW: 12.8 % (ref 11.5–15.5)
WBC: 3.8 10*3/uL — ABNORMAL LOW (ref 4.0–10.5)
nRBC: 0 % (ref 0.0–0.2)

## 2019-10-19 LAB — LACTATE DEHYDROGENASE: LDH: 136 U/L (ref 98–192)

## 2019-10-20 LAB — PROTEIN ELECTROPHORESIS, SERUM
A/G Ratio: 1 (ref 0.7–1.7)
Albumin ELP: 3.8 g/dL (ref 2.9–4.4)
Alpha-1-Globulin: 0.2 g/dL (ref 0.0–0.4)
Alpha-2-Globulin: 0.8 g/dL (ref 0.4–1.0)
Beta Globulin: 0.9 g/dL (ref 0.7–1.3)
Gamma Globulin: 2.1 g/dL — ABNORMAL HIGH (ref 0.4–1.8)
Globulin, Total: 4 g/dL — ABNORMAL HIGH (ref 2.2–3.9)
M-Spike, %: 1.7 g/dL — ABNORMAL HIGH
Total Protein ELP: 7.8 g/dL (ref 6.0–8.5)

## 2019-10-20 LAB — KAPPA/LAMBDA LIGHT CHAINS
Kappa free light chain: 27.1 mg/L — ABNORMAL HIGH (ref 3.3–19.4)
Kappa, lambda light chain ratio: 1.77 — ABNORMAL HIGH (ref 0.26–1.65)
Lambda free light chains: 15.3 mg/L (ref 5.7–26.3)

## 2019-10-26 ENCOUNTER — Other Ambulatory Visit: Payer: Self-pay

## 2019-10-26 ENCOUNTER — Inpatient Hospital Stay (HOSPITAL_COMMUNITY): Payer: Medicare Other | Admitting: Hematology

## 2019-10-26 VITALS — BP 162/81 | HR 68 | Temp 96.9°F | Resp 19 | Wt 179.6 lb

## 2019-10-26 DIAGNOSIS — M47816 Spondylosis without myelopathy or radiculopathy, lumbar region: Secondary | ICD-10-CM | POA: Diagnosis not present

## 2019-10-26 DIAGNOSIS — D472 Monoclonal gammopathy: Secondary | ICD-10-CM

## 2019-10-26 DIAGNOSIS — Z87891 Personal history of nicotine dependence: Secondary | ICD-10-CM | POA: Diagnosis not present

## 2019-10-26 DIAGNOSIS — Z79899 Other long term (current) drug therapy: Secondary | ICD-10-CM | POA: Diagnosis not present

## 2019-10-26 DIAGNOSIS — N182 Chronic kidney disease, stage 2 (mild): Secondary | ICD-10-CM | POA: Diagnosis not present

## 2019-10-26 DIAGNOSIS — C9000 Multiple myeloma not having achieved remission: Secondary | ICD-10-CM

## 2019-10-26 DIAGNOSIS — Z809 Family history of malignant neoplasm, unspecified: Secondary | ICD-10-CM | POA: Diagnosis not present

## 2019-10-26 DIAGNOSIS — Z823 Family history of stroke: Secondary | ICD-10-CM | POA: Diagnosis not present

## 2019-10-26 DIAGNOSIS — R5383 Other fatigue: Secondary | ICD-10-CM | POA: Diagnosis not present

## 2019-10-26 DIAGNOSIS — Z85820 Personal history of malignant melanoma of skin: Secondary | ICD-10-CM | POA: Diagnosis not present

## 2019-10-26 NOTE — Progress Notes (Signed)
[place], [place] [address]   CLINIC:  Medical Oncology/Hematology  PCP:  Sinda Du, MD None  None  REASON FOR VISIT:  Follow-up for smoldering multiple myeloma  PRIOR THERAPY: None  CURRENT THERAPY: Surveillance  INTERVAL HISTORY:  [name], a 86 y.o. male, returns for routine follow-up for his smoldering multiple myeloma. Eldar was last seen on 04/24/2019.  Today he reports feeling well and denies recent infections, F/C, or new aches or pains.   REVIEW OF SYSTEMS:  Review of Systems  Constitutional: Positive for fatigue (mild). Negative for appetite change, chills and fever.  Musculoskeletal: Negative for arthralgias and myalgias.  All other systems reviewed and are negative.   PAST MEDICAL/SURGICAL HISTORY:  Past Medical History:  Diagnosis Date   Amputation of index finger    Chronic kidney disease, stage 2 (mild)    Diabetes ([place])    Diabetes mellitus ([place])    Disorder of the skin and subcutaneous tissue, unspecified    Glaucoma    High cholesterol    Hypercholesteremia    Hypertension    Malignant neoplasm of external lower lip    Melanoma ([facility])    lower back   Monoclonal gammopathy    Multiple myeloma not having achieved remission ([place])    Other pancytopenia ([place]) 02/25/2014   Personal history of other malignant neoplasm of skin    Smoldering multiple myeloma (SMM) ([place]) 02/25/2014   Unspecified glaucoma    Past Surgical History:  Procedure Laterality Date   AMPUTATION FINGER / THUMB Left    forefinger   BONE MARROW ASPIRATION Left 07/19/14   BONE MARROW BIOPSY Left 07/19/14   CATARACT EXTRACTION Bilateral    CORNEAL TRANSPLANT Right    MELANOMA EXCISION Left    left flank area 1980s   TONSILLECTOMY      SOCIAL HISTORY:  Social History   Socioeconomic History   Marital status: Widowed    Spouse name: Not on file   Number of children: Not on file   Years of  education: Not on file   Highest education level: Not on file  Occupational History   Not on file  Tobacco Use   Smoking status: Former Smoker    Types: Cigarettes   Smokeless tobacco: Former Systems developer    Types: Nurse, children's Use: Never used  Substance and Sexual Activity   Alcohol use: No   Drug use: No   Sexual activity: Not on file  Other Topics Concern   Not on file  Social History Narrative   Not on file   Social Determinants of Health   Financial Resource Strain:    Difficulty of Paying Living Expenses: Not on file  Food Insecurity:    Worried About Charity fundraiser in the Last Year: Not on file   [employer] of Food in the Last Year: Not on file  Transportation Needs:    Lack of Transportation (Medical): Not on file   Lack of Transportation (Non-Medical): Not on file  Physical Activity:    Days of Exercise per Week: Not on file   Minutes of Exercise per Session: Not on file  Stress:    Feeling of Stress : Not on file  Social Connections:    Frequency of Communication with Friends and Family: Not on file   Frequency of Social Gatherings with Friends and Family: Not on file   Attends Religious Services: Not on file  Active Member of Clubs or Organizations: Not on file   Attends Archivist Meetings: Not on file   Marital Status: Not on file  Intimate Partner Violence:    Fear of Current or Ex-Partner: Not on file   Emotionally Abused: Not on file   Physically Abused: Not on file   Sexually Abused: Not on file    FAMILY HISTORY:  Family History  Problem Relation Age of Onset   Cancer Mother    Stroke Father     CURRENT MEDICATIONS:  Current Outpatient Medications  Medication Sig Dispense Refill   amLODipine (NORVASC) 10 MG tablet Take 10 mg by mouth daily.      aspirin EC 325 MG tablet Take 325 mg by mouth daily.     doxazosin (CARDURA) 4 MG tablet Take 4 mg by mouth daily.   1   finasteride (PROSCAR)  5 MG tablet Take 5 mg by mouth daily.  0   glipiZIDE (GLUCOTROL XL) 5 MG 24 hr tablet Take 5 mg by mouth daily with breakfast.      ketoconazole (NIZORAL) 2 % cream Apply 1 application topically 2 (two) times daily. Apply to feet     levothyroxine (SYNTHROID) 25 MCG tablet Take 25 mcg by mouth daily.     metoprolol (LOPRESSOR) 100 MG tablet Take 100 mg by mouth 2 (two) times daily.     Multiple Vitamins-Minerals (CENTRUM SILVER PO) Take 1 capsule by mouth daily.     OneTouch Delica Lancets 61P MISC Apply topically daily.     potassium chloride SA (K-DUR,KLOR-CON) 20 MEQ tablet Take 20 mEq by mouth daily.     pravastatin (PRAVACHOL) 40 MG tablet Take 40 mg by mouth daily.     timolol (TIMOPTIC) 0.5 % ophthalmic solution Place 1 drop into the right eye daily.   1   valsartan-hydrochlorothiazide (DIOVAN-HCT) 160-12.5 MG per tablet Take 1 tablet by mouth daily.     No current facility-administered medications for this visit.    ALLERGIES:  No Known Allergies  PHYSICAL EXAM:  Performance status (ECOG): 1 - Symptomatic but completely ambulatory  Vitals:   10/26/19 0822  BP: (!) 162/81  Pulse: 68  Resp: 19  Temp: (!) 96.9 F (36.1 C)  SpO2: 99%   Wt Readings from Last 3 Encounters:  10/26/19 179 lb 9.6 oz (81.5 kg)  04/24/19 178 lb 14.4 oz (81.1 kg)  10/20/18 176 lb 3.2 oz (79.9 kg)   Physical Exam Vitals reviewed.  Constitutional:      Appearance: Normal appearance.  Cardiovascular:     Rate and Rhythm: Normal rate and regular rhythm.     Pulses: Normal pulses.     Heart sounds: Normal heart sounds.  Pulmonary:     Effort: Pulmonary effort is normal.     Breath sounds: Normal breath sounds.  Abdominal:     Palpations: Abdomen is soft. There is no hepatomegaly, splenomegaly or mass.     Tenderness: There is no abdominal tenderness.     Hernia: No hernia is present.  Musculoskeletal:     Right lower leg: No edema.     Left lower leg: No edema.  Neurological:      General: No focal deficit present.     Mental Status: He is alert and oriented to person, place, and time.  Psychiatric:        Mood and Affect: Mood normal.        Behavior: Behavior normal.     LABORATORY DATA:  I have reviewed the labs as listed.  CBC Latest Ref Rng & Units 10/19/2019 04/17/2019 10/19/2018  WBC 4.0 - 10.5 K/uL 3.8(L) 3.9(L) 4.0  Hemoglobin 13.0 - 17.0 g/dL 12.9(L) 13.0 13.5  Hematocrit 39 - 52 % 38.9(L) 38.9(L) 38(A)  Platelets 150 - 400 K/uL 194 203 124(A)   CMP Latest Ref Rng & Units 10/19/2019 04/17/2019 10/19/2018  Glucose 70 - 99 mg/dL 190(H) 247(H) -  BUN 8 - 23 mg/dL 15 24(H) 18  Creatinine 0.61 - 1.24 mg/dL 1.15 1.31(H) 1.2  Sodium 135 - 145 mmol/L 133(L) 136 138  Potassium 3.5 - 5.1 mmol/L 3.6 3.6 4.4  Chloride 98 - 111 mmol/L 97(L) 99 96(A)  CO2 22 - 32 mmol/L 28 29 28(A)  Calcium 8.9 - 10.3 mg/dL 9.2 9.0 9.6  Total Protein 6.5 - 8.1 g/dL 8.4(H) 8.5(H) -  Total Bilirubin 0.3 - 1.2 mg/dL 0.7 0.7 -  Alkaline Phos 38 - 126 U/L 67 74 91  AST 15 - 41 U/L 23 23 29   ALT 0 - 44 U/L 24 23 29       Component Value Date/Time   RBC 4.23 10/19/2019 1010   MCV 92.0 10/19/2019 1010   MCH 30.5 10/19/2019 1010   MCHC 33.2 10/19/2019 1010   RDW 12.8 10/19/2019 1010   LYMPHSABS 1.1 10/19/2019 1010   MONOABS 0.4 10/19/2019 1010   EOSABS 0.2 10/19/2019 1010   BASOSABS 0.0 10/19/2019 1010   Lab Results  Component Value Date   LDH 136 10/19/2019   LDH 132 04/17/2019   LDH 139 10/13/2018   Lab Results  Component Value Date   TOTALPROTELP 7.8 10/19/2019   ALBUMINELP 3.8 10/19/2019   A1GS 0.2 10/19/2019   A2GS 0.8 10/19/2019   BETS 0.9 10/19/2019   GAMS 2.1 (H) 10/19/2019   MSPIKE 1.7 (H) 10/19/2019   SPEI Comment 10/19/2019   Lab Results  Component Value Date   KPAFRELGTCHN 27.1 (H) 10/19/2019   LAMBDASER 15.3 10/19/2019   KAPLAMBRATIO 1.77 (H) 10/19/2019    DIAGNOSTIC IMAGING:  I have independently reviewed the scans and discussed with the  patient. DG Bone Survey Met  Result Date: 10/20/2019 CLINICAL DATA:  Multiple myeloma EXAM: METASTATIC BONE SURVEY COMPARISON:  10/13/2018 FINDINGS: Stable erosion within the right scaphoid, nonspecific. No suspicious lytic or blastic bone lesions. Remote nonunited fracture of the left ulnar styloid. Degenerative changes noted within the lower cervical spine. Degenerative changes noted within the midthoracic spine. Degenerative changes noted within the mid and lower lumbar spine. IMPRESSION: No suspicious lytic or blastic bone lesions identified. Electronically Signed   By: Fidela Salisbury MD   On: 10/20/2019 00:22     ASSESSMENT:  1.  IgG kappa smoldering myeloma: -Bone marrow biopsy on 07/19/2014 shows 10% plasma cells with normal cytogenetics and FISH panel. -PET scan on 08/14/2014 was negative for lytic lesions.  Subsequent CT scan of the chest in 2018 was also negative. -Skeletal survey on 10/19/2019 did not show any lytic lesions or blastic lesions.   2.  CKD: -Baseline creatinine between 1.2-1.5.   PLAN:  1.  IgG kappa smoldering myeloma: -Does not have any new onset bone pains.  Denies B symptoms. -Reviewed labs from 10/19/2019.  M spike is 1.7.  Light chain ratio is 1.7.  Kappa light chains at 27. -Creatinine is 1.15 and calcium 9.2.  I reviewed results of the skeletal survey which was negative. -RTC 6 months with myeloma labs.  Repeat skeletal survey in 1 year.   Orders placed  this encounter:  No orders of the defined types were placed in this encounter.    Derek Jack, MD [place] [phone]   I, Milinda Antis, am acting as a scribe for Dr. Sanda Linger.  I, Derek Jack MD, have reviewed the above documentation for accuracy and completeness, and I agree with the above.

## 2019-10-26 NOTE — Patient Instructions (Signed)
[facility] at [facility] Discharge Instructions  You were seen today by Dr. Katragadda. He went over your recent results and scans. Dr. Katragadda will see you back in 6 months for labs and follow up.   Thank you for choosing [facility] at [facility] to provide your oncology and hematology care.  To afford each patient quality time with our provider, please arrive at least 15 minutes before your scheduled appointment time.   If you have a lab appointment with the Cancer Center please come in thru the Main Entrance and check in at the main information desk  You need to re-schedule your appointment should you arrive 10 or more minutes late.  We strive to give you quality time with our providers, and arriving late affects you and other patients whose appointments are after yours.  Also, if you no show three or more times for appointments you may be dismissed from the clinic at the providers discretion.     Again, thank you for choosing [facility].  Our hope is that these requests will decrease the amount of time that you wait before being seen by our physicians.       _____________________________________________________________  Should you have questions after your visit to [facility], please contact our office at [phone] between the hours of 8:00 a.m. and 4:30 p.m.  Voicemails left after 4:00 p.m. will not be returned until the following business day.  For prescription refill requests, have your pharmacy contact our office and allow 72 hours.    Cancer Center Support Programs:   > Cancer Support Group  2nd Tuesday of the month 1pm-2pm, Journey Room   

## 2019-12-01 DIAGNOSIS — I10 Essential (primary) hypertension: Secondary | ICD-10-CM | POA: Diagnosis not present

## 2019-12-01 DIAGNOSIS — E039 Hypothyroidism, unspecified: Secondary | ICD-10-CM | POA: Diagnosis not present

## 2019-12-01 DIAGNOSIS — Z79899 Other long term (current) drug therapy: Secondary | ICD-10-CM | POA: Diagnosis not present

## 2019-12-01 DIAGNOSIS — E7849 Other hyperlipidemia: Secondary | ICD-10-CM | POA: Diagnosis not present

## 2019-12-01 DIAGNOSIS — E1165 Type 2 diabetes mellitus with hyperglycemia: Secondary | ICD-10-CM | POA: Diagnosis not present

## 2019-12-07 DIAGNOSIS — E782 Mixed hyperlipidemia: Secondary | ICD-10-CM | POA: Diagnosis not present

## 2019-12-07 DIAGNOSIS — I10 Essential (primary) hypertension: Secondary | ICD-10-CM | POA: Diagnosis not present

## 2019-12-07 DIAGNOSIS — E1165 Type 2 diabetes mellitus with hyperglycemia: Secondary | ICD-10-CM | POA: Diagnosis not present

## 2019-12-07 DIAGNOSIS — N1831 Chronic kidney disease, stage 3a: Secondary | ICD-10-CM | POA: Diagnosis not present

## 2019-12-07 DIAGNOSIS — Z79899 Other long term (current) drug therapy: Secondary | ICD-10-CM | POA: Diagnosis not present

## 2019-12-25 DIAGNOSIS — D225 Melanocytic nevi of trunk: Secondary | ICD-10-CM | POA: Diagnosis not present

## 2019-12-25 DIAGNOSIS — L720 Epidermal cyst: Secondary | ICD-10-CM | POA: Diagnosis not present

## 2019-12-25 DIAGNOSIS — Z08 Encounter for follow-up examination after completed treatment for malignant neoplasm: Secondary | ICD-10-CM | POA: Diagnosis not present

## 2019-12-25 DIAGNOSIS — C44622 Squamous cell carcinoma of skin of right upper limb, including shoulder: Secondary | ICD-10-CM | POA: Diagnosis not present

## 2019-12-25 DIAGNOSIS — Z1283 Encounter for screening for malignant neoplasm of skin: Secondary | ICD-10-CM | POA: Diagnosis not present

## 2019-12-25 DIAGNOSIS — Z85820 Personal history of malignant melanoma of skin: Secondary | ICD-10-CM | POA: Diagnosis not present

## 2020-04-25 ENCOUNTER — Other Ambulatory Visit: Payer: Self-pay

## 2020-04-25 ENCOUNTER — Inpatient Hospital Stay (HOSPITAL_COMMUNITY): Payer: Medicare Other | Attending: Hematology

## 2020-04-25 DIAGNOSIS — E7800 Pure hypercholesterolemia, unspecified: Secondary | ICD-10-CM | POA: Insufficient documentation

## 2020-04-25 DIAGNOSIS — I129 Hypertensive chronic kidney disease with stage 1 through stage 4 chronic kidney disease, or unspecified chronic kidney disease: Secondary | ICD-10-CM | POA: Insufficient documentation

## 2020-04-25 DIAGNOSIS — Z87891 Personal history of nicotine dependence: Secondary | ICD-10-CM | POA: Insufficient documentation

## 2020-04-25 DIAGNOSIS — E119 Type 2 diabetes mellitus without complications: Secondary | ICD-10-CM | POA: Diagnosis not present

## 2020-04-25 DIAGNOSIS — Z85828 Personal history of other malignant neoplasm of skin: Secondary | ICD-10-CM | POA: Insufficient documentation

## 2020-04-25 DIAGNOSIS — Z85820 Personal history of malignant melanoma of skin: Secondary | ICD-10-CM | POA: Diagnosis not present

## 2020-04-25 DIAGNOSIS — Z79899 Other long term (current) drug therapy: Secondary | ICD-10-CM | POA: Diagnosis not present

## 2020-04-25 DIAGNOSIS — C9000 Multiple myeloma not having achieved remission: Secondary | ICD-10-CM | POA: Diagnosis not present

## 2020-04-25 DIAGNOSIS — N182 Chronic kidney disease, stage 2 (mild): Secondary | ICD-10-CM | POA: Insufficient documentation

## 2020-04-25 DIAGNOSIS — D472 Monoclonal gammopathy: Secondary | ICD-10-CM

## 2020-04-25 DIAGNOSIS — Z7984 Long term (current) use of oral hypoglycemic drugs: Secondary | ICD-10-CM | POA: Insufficient documentation

## 2020-04-25 DIAGNOSIS — Z7982 Long term (current) use of aspirin: Secondary | ICD-10-CM | POA: Diagnosis not present

## 2020-04-25 LAB — COMPREHENSIVE METABOLIC PANEL
ALT: 22 U/L (ref 0–44)
AST: 24 U/L (ref 15–41)
Albumin: 4.1 g/dL (ref 3.5–5.0)
Alkaline Phosphatase: 65 U/L (ref 38–126)
Anion gap: 8 (ref 5–15)
BUN: 21 mg/dL (ref 8–23)
CO2: 28 mmol/L (ref 22–32)
Calcium: 8.9 mg/dL (ref 8.9–10.3)
Chloride: 101 mmol/L (ref 98–111)
Creatinine, Ser: 1.22 mg/dL (ref 0.61–1.24)
GFR, Estimated: 58 mL/min — ABNORMAL LOW (ref >60)
Glucose, Bld: 140 mg/dL — ABNORMAL HIGH (ref 70–99)
Potassium: 3.5 mmol/L (ref 3.5–5.1)
Sodium: 137 mmol/L (ref 135–145)
Total Bilirubin: 0.9 mg/dL (ref 0.3–1.2)
Total Protein: 8 g/dL (ref 6.5–8.1)

## 2020-04-25 LAB — CBC WITH DIFFERENTIAL/PLATELET
Abs Immature Granulocytes: 0.01 10*3/uL (ref 0.00–0.07)
Basophils Absolute: 0 10*3/uL (ref 0.0–0.1)
Basophils Relative: 1 %
Eosinophils Absolute: 0.2 10*3/uL (ref 0.0–0.5)
Eosinophils Relative: 7 %
HCT: 38.3 % — ABNORMAL LOW (ref 39.0–52.0)
Hemoglobin: 12.8 g/dL — ABNORMAL LOW (ref 13.0–17.0)
Immature Granulocytes: 0 %
Lymphocytes Relative: 33 %
Lymphs Abs: 1.1 10*3/uL (ref 0.7–4.0)
MCH: 31.1 pg (ref 26.0–34.0)
MCHC: 33.4 g/dL (ref 30.0–36.0)
MCV: 93.2 fL (ref 80.0–100.0)
Monocytes Absolute: 0.3 10*3/uL (ref 0.1–1.0)
Monocytes Relative: 8 %
Neutro Abs: 1.8 10*3/uL (ref 1.7–7.7)
Neutrophils Relative %: 51 %
Platelets: 181 10*3/uL (ref 150–400)
RBC: 4.11 MIL/uL — ABNORMAL LOW (ref 4.22–5.81)
RDW: 12.8 % (ref 11.5–15.5)
WBC: 3.5 10*3/uL — ABNORMAL LOW (ref 4.0–10.5)
nRBC: 0 % (ref 0.0–0.2)

## 2020-04-26 LAB — KAPPA/LAMBDA LIGHT CHAINS
Kappa free light chain: 22.5 mg/L — ABNORMAL HIGH (ref 3.3–19.4)
Kappa, lambda light chain ratio: 1.58 (ref 0.26–1.65)
Lambda free light chains: 14.2 mg/L (ref 5.7–26.3)

## 2020-04-29 LAB — PROTEIN ELECTROPHORESIS, SERUM
A/G Ratio: 1 (ref 0.7–1.7)
Albumin ELP: 3.9 g/dL (ref 2.9–4.4)
Alpha-1-Globulin: 0.3 g/dL (ref 0.0–0.4)
Alpha-2-Globulin: 0.6 g/dL (ref 0.4–1.0)
Beta Globulin: 1.1 g/dL (ref 0.7–1.3)
Gamma Globulin: 2.1 g/dL — ABNORMAL HIGH (ref 0.4–1.8)
Globulin, Total: 4 g/dL — ABNORMAL HIGH (ref 2.2–3.9)
M-Spike, %: 1.4 g/dL — ABNORMAL HIGH
Total Protein ELP: 7.9 g/dL (ref 6.0–8.5)

## 2020-05-02 ENCOUNTER — Inpatient Hospital Stay (HOSPITAL_BASED_OUTPATIENT_CLINIC_OR_DEPARTMENT_OTHER): Payer: Medicare Other | Admitting: Hematology

## 2020-05-02 ENCOUNTER — Other Ambulatory Visit: Payer: Self-pay

## 2020-05-02 VITALS — BP 186/69 | HR 68 | Temp 98.4°F | Resp 18 | Wt 181.1 lb

## 2020-05-02 DIAGNOSIS — C9000 Multiple myeloma not having achieved remission: Secondary | ICD-10-CM

## 2020-05-02 DIAGNOSIS — D472 Monoclonal gammopathy: Secondary | ICD-10-CM

## 2020-05-02 NOTE — Patient Instructions (Signed)
[place] at [facility] Discharge Instructions  You were seen today by Dr. Delton Coombes. He went over your recent results. You will be scheduled to have a skeletal survey done before your next visit. Your next appointment will be with the physician assistant in 6 months for labs and follow up.   Thank you for choosing [place] at [facility] to provide your oncology and hematology care.  To afford each patient quality time with our provider, please arrive at least 15 minutes before your scheduled appointment time.   If you have a lab appointment with the [facility] please come in thru the Main Entrance and check in at the main information desk  You need to re-schedule your appointment should you arrive 10 or more minutes late.  We strive to give you quality time with our providers, and arriving late affects you and other patients whose appointments are after yours.  Also, if you no show three or more times for appointments you may be dismissed from the clinic at the providers discretion.     Again, thank you for choosing [facility].  Our hope is that these requests will decrease the amount of time that you wait before being seen by our physicians.       _____________________________________________________________  Should you have questions after your visit to [facility], please contact our office at [phone] between the hours of 8:00 a.m. and 4:30 p.m.  Voicemails left after 4:00 p.m. will not be returned until the following business day.  For prescription refill requests, have your pharmacy contact our office and allow 72 hours.    Cancer Center Support Programs:   > Cancer Support Group  2nd Tuesday of the month 1pm-2pm, Journey Room

## 2020-05-02 NOTE — Progress Notes (Signed)
[place], [place] [address]   CLINIC:  Medical Oncology/Hematology  PCP:  Garald Braver, FNP [phone] [address]. [address] [place] / [place] [address]  [phone]  REASON FOR VISIT:  Follow-up for smoldering multiple myeloma  PRIOR THERAPY: None  CURRENT THERAPY: Surveillance  INTERVAL HISTORY:  [name], a 87 y.o. male, returns for routine follow-up for his smoldering multiple myeloma. Tyjuan was last seen on 10/26/2019.  Today he reports feeling okay. He denies having any new pains, recent infections, F/C or night sweats. He denies any changes in his overall health and his BP at home is normal.  He quit smoking 60 years ago.    REVIEW OF SYSTEMS:  Review of Systems  Constitutional: Positive for fatigue (50%). Negative for appetite change, chills, diaphoresis and fever.  Musculoskeletal: Negative for arthralgias and myalgias.  All other systems reviewed and are negative.   PAST MEDICAL/SURGICAL HISTORY:  Past Medical History:  Diagnosis Date  . Amputation of index finger   . Chronic kidney disease, stage 2 (mild)   . Diabetes ([facility])   . Diabetes mellitus ([facility])   . Disorder of the skin and subcutaneous tissue, unspecified   . Glaucoma   . High cholesterol   . Hypercholesteremia   . Hypertension   . Malignant neoplasm of external lower lip   . Melanoma ([facility])    lower back  . Monoclonal gammopathy   . Multiple myeloma not having achieved remission ([facility])   . Other pancytopenia ([facility]) 02/25/2014  . Personal history of other malignant neoplasm of skin   . Smoldering multiple myeloma (SMM) ([facility]) 02/25/2014  . Unspecified glaucoma    Past Surgical History:  Procedure Laterality Date  . AMPUTATION FINGER / THUMB Left    forefinger  . BONE MARROW ASPIRATION Left 07/19/14  . BONE MARROW BIOPSY Left 07/19/14  . CATARACT EXTRACTION Bilateral   . CORNEAL TRANSPLANT Right   . MELANOMA EXCISION Left    left flank area 1980s  .  TONSILLECTOMY      SOCIAL HISTORY:  Social History   Socioeconomic History  . Marital status: Widowed    Spouse name: Not on file  . Number of children: Not on file  . Years of education: Not on file  . Highest education level: Not on file  Occupational History  . Not on file  Tobacco Use  . Smoking status: Former Smoker    Types: Cigarettes  . Smokeless tobacco: Former Systems developer    Types: Secondary school teacher  . Vaping Use: Never used  Substance and Sexual Activity  . Alcohol use: No  . Drug use: No  . Sexual activity: Not on file  Other Topics Concern  . Not on file  Social History Narrative  . Not on file   Social Determinants of Health   Financial Resource Strain: Not on file  Food Insecurity: Not on file  Transportation Needs: Not on file  Physical Activity: Not on file  Stress: Not on file  Social Connections: Not on file  Intimate Partner Violence: Not on file    FAMILY HISTORY:  Family History  Problem Relation Age of Onset  . Cancer Mother   . Stroke Father     CURRENT MEDICATIONS:  Current Outpatient Medications  Medication Sig Dispense Refill  . amLODipine (NORVASC) 10 MG tablet Take 10 mg by mouth daily.     Marland Kitchen aspirin EC 325 MG tablet Take 325 mg  by mouth daily.    Marland Kitchen doxazosin (CARDURA) 4 MG tablet Take 4 mg by mouth daily.   1  . finasteride (PROSCAR) 5 MG tablet Take 5 mg by mouth daily.  0  . glipiZIDE (GLUCOTROL XL) 5 MG 24 hr tablet Take 5 mg by mouth daily with breakfast.     . ketoconazole (NIZORAL) 2 % cream Apply 1 application topically 2 (two) times daily. Apply to feet    . levothyroxine (SYNTHROID) 25 MCG tablet Take 25 mcg by mouth daily.    . metoprolol (LOPRESSOR) 100 MG tablet Take 100 mg by mouth 2 (two) times daily.    . Multiple Vitamins-Minerals (CENTRUM SILVER PO) Take 1 capsule by mouth daily.    Glory Rosebush Delica Lancets 10C MISC Apply topically daily.    . potassium chloride SA (K-DUR,KLOR-CON) 20 MEQ tablet Take 20 mEq by mouth  daily.    . pravastatin (PRAVACHOL) 40 MG tablet Take 40 mg by mouth daily.    . timolol (TIMOPTIC) 0.5 % ophthalmic solution Place 1 drop into the right eye daily.   1  . triamcinolone (KENALOG) 0.1 % SMARTSIG:1 Application Topical 2-3 Times Daily    . valsartan-hydrochlorothiazide (DIOVAN-HCT) 160-12.5 MG per tablet Take 1 tablet by mouth daily.     No current facility-administered medications for this visit.    ALLERGIES:  No Known Allergies  PHYSICAL EXAM:  Performance status (ECOG): 1 - Symptomatic but completely ambulatory  Vitals:   05/02/20 0822  BP: (!) 186/69  Pulse: 68  Resp: 18  Temp: 98.4 F (36.9 C)  SpO2: 97%   Wt Readings from Last 3 Encounters:  05/02/20 181 lb 1.6 oz (82.1 kg)  10/26/19 179 lb 9.6 oz (81.5 kg)  04/24/19 178 lb 14.4 oz (81.1 kg)   Physical Exam Vitals reviewed.  Constitutional:      Appearance: Normal appearance.  Cardiovascular:     Rate and Rhythm: Normal rate and regular rhythm.     Pulses: Normal pulses.     Heart sounds: Normal heart sounds.  Pulmonary:     Effort: Pulmonary effort is normal.     Breath sounds: Normal breath sounds.  Abdominal:     Palpations: Abdomen is soft. There is no hepatomegaly or mass.     Tenderness: There is no abdominal tenderness.     Hernia: No hernia is present.  Musculoskeletal:     Right lower leg: No edema.     Left lower leg: No edema.  Neurological:     General: No focal deficit present.     Mental Status: He is alert and oriented to person, place, and time.  Psychiatric:        Mood and Affect: Mood normal.        Behavior: Behavior normal.     LABORATORY DATA:  I have reviewed the labs as listed.  CBC Latest Ref Rng & Units 04/25/2020 10/19/2019 04/17/2019  WBC 4.0 - 10.5 K/uL 3.5(L) 3.8(L) 3.9(L)  Hemoglobin 13.0 - 17.0 g/dL 12.8(L) 12.9(L) 13.0  Hematocrit 39.0 - 52.0 % 38.3(L) 38.9(L) 38.9(L)  Platelets 150 - 400 K/uL 181 194 203   CMP Latest Ref Rng & Units 04/25/2020 10/19/2019  04/17/2019  Glucose 70 - 99 mg/dL 140(H) 190(H) 247(H)  BUN 8 - 23 mg/dL 21 15 24(H)  Creatinine 0.61 - 1.24 mg/dL 1.22 1.15 1.31(H)  Sodium 135 - 145 mmol/L 137 133(L) 136  Potassium 3.5 - 5.1 mmol/L 3.5 3.6 3.6  Chloride 98 - 111  mmol/L 101 97(L) 99  CO2 22 - 32 mmol/L 28 28 29   Calcium 8.9 - 10.3 mg/dL 8.9 9.2 9.0  Total Protein 6.5 - 8.1 g/dL 8.0 8.4(H) 8.5(H)  Total Bilirubin 0.3 - 1.2 mg/dL 0.9 0.7 0.7  Alkaline Phos 38 - 126 U/L 65 67 74  AST 15 - 41 U/L 24 23 23   ALT 0 - 44 U/L 22 24 23       Component Value Date/Time   RBC 4.11 (L) 04/25/2020 1007   MCV 93.2 04/25/2020 1007   MCH 31.1 04/25/2020 1007   MCHC 33.4 04/25/2020 1007   RDW 12.8 04/25/2020 1007   LYMPHSABS 1.1 04/25/2020 1007   MONOABS 0.3 04/25/2020 1007   EOSABS 0.2 04/25/2020 1007   BASOSABS 0.0 04/25/2020 1007   Lab Results  Component Value Date   TOTALPROTELP 7.9 04/25/2020   ALBUMINELP 3.9 04/25/2020   A1GS 0.3 04/25/2020   A2GS 0.6 04/25/2020   BETS 1.1 04/25/2020   GAMS 2.1 (H) 04/25/2020   MSPIKE 1.4 (H) 04/25/2020   SPEI Comment 04/25/2020    Lab Results  Component Value Date   KPAFRELGTCHN 22.5 (H) 04/25/2020   LAMBDASER 14.2 04/25/2020   KAPLAMBRATIO 1.58 04/25/2020    DIAGNOSTIC IMAGING:  I have independently reviewed the scans and discussed with the patient. No results found.   ASSESSMENT:  1. IgG kappa smoldering myeloma: -Bone marrow biopsy on 07/19/2014 shows 10% plasma cells with normal cytogenetics and FISH panel. -PET scan on 08/14/2014 was negative for lytic lesions. Subsequent CT scan of the chest in 2018 was also negative. -Skeletal survey on 10/19/2019 did not show any lytic lesions or blastic lesions.  2. CKD: -Baseline creatinine between 1.2-1.5.   PLAN:  1. IgG kappa smoldering myeloma: -He does not have any new onset bone pains.  Denies any recurrent infections. -Blood pressure is elevated at 186/69.  He reports that his blood pressure at home is always  normal. -Reviewed labs from 04/25/2020.  M spike is stable at 1.4 g.  Hemoglobin 12.8.  Mild leukopenia with no neutropenia is also stable.  Creatinine is 1.22 and stable.  Calcium is 8.9.  Free light chain ratio is normal at 1.58 with kappa light chains elevated at 22.5. -No further work-up needed.  RTC 6 months with repeat labs and skeletal survey.  Orders placed this encounter:  Orders Placed This Encounter  Procedures  . CBC with Differential/Platelet  . Comprehensive metabolic panel  . Protein electrophoresis, serum  . Kappa/lambda light chains     Derek Jack, MD [place] [phone]   I, Milinda Antis, am acting as a scribe for Dr. Sanda Linger.  I, Derek Jack MD, have reviewed the above documentation for accuracy and completeness, and I agree with the above.

## 2020-11-01 ENCOUNTER — Ambulatory Visit (HOSPITAL_COMMUNITY)
Admission: RE | Admit: 2020-11-01 | Discharge: 2020-11-01 | Disposition: A | Discharge status: Home / Self Care | Payer: Medicare Other | Source: Ambulatory Visit | Attending: Hematology | Admitting: Hematology

## 2020-11-01 ENCOUNTER — Other Ambulatory Visit: Payer: Self-pay

## 2020-11-01 ENCOUNTER — Inpatient Hospital Stay (HOSPITAL_COMMUNITY): Payer: Medicare Other | Attending: Hematology

## 2020-11-01 DIAGNOSIS — C9000 Multiple myeloma not having achieved remission: Secondary | ICD-10-CM | POA: Diagnosis present

## 2020-11-01 DIAGNOSIS — D472 Monoclonal gammopathy: Secondary | ICD-10-CM

## 2020-11-01 DIAGNOSIS — I10 Essential (primary) hypertension: Secondary | ICD-10-CM | POA: Diagnosis not present

## 2020-11-01 DIAGNOSIS — Z87891 Personal history of nicotine dependence: Secondary | ICD-10-CM | POA: Diagnosis not present

## 2020-11-01 DIAGNOSIS — Z79899 Other long term (current) drug therapy: Secondary | ICD-10-CM | POA: Insufficient documentation

## 2020-11-01 DIAGNOSIS — N183 Chronic kidney disease, stage 3 unspecified: Secondary | ICD-10-CM | POA: Insufficient documentation

## 2020-11-01 DIAGNOSIS — E119 Type 2 diabetes mellitus without complications: Secondary | ICD-10-CM | POA: Diagnosis not present

## 2020-11-01 LAB — CBC WITH DIFFERENTIAL/PLATELET
Abs Immature Granulocytes: 0.01 10*3/uL (ref 0.00–0.07)
Basophils Absolute: 0 10*3/uL (ref 0.0–0.1)
Basophils Relative: 0 %
Eosinophils Absolute: 0.3 10*3/uL (ref 0.0–0.5)
Eosinophils Relative: 6 %
HCT: 39.7 % (ref 39.0–52.0)
Hemoglobin: 13.5 g/dL (ref 13.0–17.0)
Immature Granulocytes: 0 %
Lymphocytes Relative: 34 %
Lymphs Abs: 1.5 10*3/uL (ref 0.7–4.0)
MCH: 32.4 pg (ref 26.0–34.0)
MCHC: 34 g/dL (ref 30.0–36.0)
MCV: 95.2 fL (ref 80.0–100.0)
Monocytes Absolute: 0.4 10*3/uL (ref 0.1–1.0)
Monocytes Relative: 10 %
Neutro Abs: 2.2 10*3/uL (ref 1.7–7.7)
Neutrophils Relative %: 50 %
Platelets: 193 10*3/uL (ref 150–400)
RBC: 4.17 MIL/uL — ABNORMAL LOW (ref 4.22–5.81)
RDW: 13.2 % (ref 11.5–15.5)
WBC: 4.5 10*3/uL (ref 4.0–10.5)
nRBC: 0 % (ref 0.0–0.2)

## 2020-11-01 LAB — COMPREHENSIVE METABOLIC PANEL
ALT: 25 U/L (ref 0–44)
AST: 25 U/L (ref 15–41)
Albumin: 4.3 g/dL (ref 3.5–5.0)
Alkaline Phosphatase: 83 U/L (ref 38–126)
Anion gap: 7 (ref 5–15)
BUN: 24 mg/dL — ABNORMAL HIGH (ref 8–23)
CO2: 30 mmol/L (ref 22–32)
Calcium: 9 mg/dL (ref 8.9–10.3)
Chloride: 99 mmol/L (ref 98–111)
Creatinine, Ser: 1.51 mg/dL — ABNORMAL HIGH (ref 0.61–1.24)
GFR, Estimated: 44 mL/min — ABNORMAL LOW (ref >60)
Glucose, Bld: 123 mg/dL — ABNORMAL HIGH (ref 70–99)
Potassium: 3.7 mmol/L (ref 3.5–5.1)
Sodium: 136 mmol/L (ref 135–145)
Total Bilirubin: 0.6 mg/dL (ref 0.3–1.2)
Total Protein: 8.5 g/dL — ABNORMAL HIGH (ref 6.5–8.1)

## 2020-11-04 LAB — KAPPA/LAMBDA LIGHT CHAINS
Kappa free light chain: 32.5 mg/L — ABNORMAL HIGH (ref 3.3–19.4)
Kappa, lambda light chain ratio: 1.63 (ref 0.26–1.65)
Lambda free light chains: 19.9 mg/L (ref 5.7–26.3)

## 2020-11-05 LAB — PROTEIN ELECTROPHORESIS, SERUM
A/G Ratio: 1.1 (ref 0.7–1.7)
Albumin ELP: 4.2 g/dL (ref 2.9–4.4)
Alpha-1-Globulin: 0.2 g/dL (ref 0.0–0.4)
Alpha-2-Globulin: 0.7 g/dL (ref 0.4–1.0)
Beta Globulin: 0.8 g/dL (ref 0.7–1.3)
Gamma Globulin: 2.1 g/dL — ABNORMAL HIGH (ref 0.4–1.8)
Globulin, Total: 3.8 g/dL (ref 2.2–3.9)
M-Spike, %: 1.7 g/dL — ABNORMAL HIGH
Total Protein ELP: 8 g/dL (ref 6.0–8.5)

## 2020-11-06 NOTE — Progress Notes (Signed)
[name], [place] [address]   CLINIC:  Medical Oncology/Hematology  PCP:  Garald Braver, FNP (Inactive) [place]  [address] [phone]   REASON FOR VISIT:  Follow-up for smoldering multiple myeloma   PRIOR THERAPY: None   CURRENT THERAPY: Surveillance   INTERVAL HISTORY:  [name], a 87 y.o. male, returns for routine follow-up for his smoldering multiple myeloma. Kruze was last seen on 05/02/2020 by Dr. Delton Coombes.  At today's visit, he reports feeling fairly.  No recent hospitalizations, surgeries, or changes in baseline health status.  He denies any new bone pain or recent fractures. He denies any B symptoms.   No new neurologic symptoms such as tinnitus, new-onset hearing loss, blurred vision, headache, or dizziness. Denies any numbness or tingling in hands or feet. No thromboembolic events since his last visit.  No new masses or lymphadenopathy per his report. No recent infections  He has 70% energy and 100% appetite. He endorses that he is maintaining a stable weight.   REVIEW OF SYSTEMS:  Review of Systems  Constitutional:  Positive for fatigue (mild fatigue at baseline, energy 70%). Negative for appetite change, chills, diaphoresis, fever and unexpected weight change.  HENT:   Negative for lump/mass and nosebleeds.   Eyes:  Negative for eye problems.  Respiratory:  Negative for cough, hemoptysis and shortness of breath.   Cardiovascular:  Negative for chest pain, leg swelling and palpitations.  Gastrointestinal:  Negative for abdominal pain, blood in stool, constipation, diarrhea, nausea and vomiting.  Genitourinary:  Negative for hematuria.   Skin: Negative.   Neurological:  Negative for dizziness, headaches and light-headedness.  Hematological:  Does not bruise/bleed easily.     PAST MEDICAL/SURGICAL HISTORY:  Past Medical History:  Diagnosis Date   Amputation of index finger     Chronic kidney disease, stage 2 (mild)    Diabetes ([facility])    Diabetes mellitus ([facility])    Disorder of the skin and subcutaneous tissue, unspecified    Glaucoma    High cholesterol    Hypercholesteremia    Hypertension    Malignant neoplasm of external lower lip    Melanoma ([facility])    lower back   Monoclonal gammopathy    Multiple myeloma not having achieved remission ([facility])    Other pancytopenia ([facility]) 02/25/2014   Personal history of other malignant neoplasm of skin    Smoldering multiple myeloma (SMM) ([facility]) 02/25/2014   Unspecified glaucoma    Past Surgical History:  Procedure Laterality Date   AMPUTATION FINGER / THUMB Left    forefinger   BONE MARROW ASPIRATION Left 07/19/14   BONE MARROW BIOPSY Left 07/19/14   CATARACT EXTRACTION Bilateral    CORNEAL TRANSPLANT Right    MELANOMA EXCISION Left    left flank area 1980s   TONSILLECTOMY       SOCIAL HISTORY:  Social History   Socioeconomic History   Marital status: Widowed    Spouse name: Not on file   Number of children: Not on file   Years of education: Not on file   Highest education level: Not on file  Occupational History   Not on file  Tobacco Use   Smoking status: Former    Types: Cigarettes   Smokeless tobacco: Former    Types: Nurse, children's Use: Never used  Substance and Sexual Activity   Alcohol use: No   Drug use: No   Sexual  activity: Not on file  Other Topics Concern   Not on file  Social History Narrative   Not on file   Social Determinants of Health   Financial Resource Strain: Not on file  Food Insecurity: Not on file  Transportation Needs: Not on file  Physical Activity: Not on file  Stress: Not on file  Social Connections: Not on file  Intimate Partner Violence: Not on file    FAMILY HISTORY:  Family History  Problem Relation Age of Onset   Cancer Mother    Stroke Father     CURRENT MEDICATIONS:  Outpatient Encounter Medications as of 11/07/2020  Medication Sig    amLODipine (NORVASC) 10 MG tablet Take 10 mg by mouth daily.    aspirin EC 325 MG tablet Take 325 mg by mouth daily.   doxazosin (CARDURA) 4 MG tablet Take 4 mg by mouth daily.    finasteride (PROSCAR) 5 MG tablet Take 5 mg by mouth daily.   glipiZIDE (GLUCOTROL XL) 5 MG 24 hr tablet Take 5 mg by mouth daily with breakfast.    ketoconazole (NIZORAL) 2 % cream Apply 1 application topically 2 (two) times daily. Apply to feet   levothyroxine (SYNTHROID) 25 MCG tablet Take 25 mcg by mouth daily.   metoprolol (LOPRESSOR) 100 MG tablet Take 100 mg by mouth 2 (two) times daily.   Multiple Vitamins-Minerals (CENTRUM SILVER PO) Take 1 capsule by mouth daily.   OneTouch Delica Lancets 22G MISC Apply topically daily.   potassium chloride SA (K-DUR,KLOR-CON) 20 MEQ tablet Take 20 mEq by mouth daily.   pravastatin (PRAVACHOL) 40 MG tablet Take 40 mg by mouth daily.   timolol (TIMOPTIC) 0.5 % ophthalmic solution Place 1 drop into the right eye daily.    triamcinolone (KENALOG) 0.1 % SMARTSIG:1 Application Topical 2-3 Times Daily   valsartan-hydrochlorothiazide (DIOVAN-HCT) 160-12.5 MG per tablet Take 1 tablet by mouth daily.   No facility-administered encounter medications on file as of 11/07/2020.    ALLERGIES:  No Known Allergies   PHYSICAL EXAM:  ECOG PERFORMANCE STATUS: 1 - Symptomatic but completely ambulatory  There were no vitals filed for this visit. There were no vitals filed for this visit. Physical Exam Constitutional:      Appearance: Normal appearance.  HENT:     Head: Normocephalic and atraumatic.     Mouth/Throat:     Mouth: Mucous membranes are moist.  Eyes:     Extraocular Movements: Extraocular movements intact.     Pupils: Pupils are equal, round, and reactive to light.  Cardiovascular:     Rate and Rhythm: Normal rate and regular rhythm.     Pulses: Normal pulses.     Heart sounds: Normal heart sounds.  Pulmonary:     Effort: Pulmonary effort is normal.     Breath  sounds: Normal breath sounds.  Abdominal:     General: Bowel sounds are normal.     Palpations: Abdomen is soft.     Tenderness: There is no abdominal tenderness.  Musculoskeletal:        General: No swelling.     Right lower leg: No edema.     Left lower leg: No edema.  Lymphadenopathy:     Head:     Right side of head: No submental, submandibular, tonsillar, preauricular, posterior auricular or occipital adenopathy.     Left side of head: No submental, submandibular, tonsillar, preauricular, posterior auricular or occipital adenopathy.     Cervical: No cervical adenopathy.  Skin:  General: Skin is warm and dry.  Neurological:     General: No focal deficit present.     Mental Status: He is alert and oriented to person, place, and time.  Psychiatric:        Mood and Affect: Mood normal.        Behavior: Behavior normal.     LABORATORY DATA:  I have reviewed the labs as listed.  CBC    Component Value Date/Time   WBC 4.5 11/01/2020 0908   RBC 4.17 (L) 11/01/2020 0908   HGB 13.5 11/01/2020 0908   HCT 39.7 11/01/2020 0908   PLT 193 11/01/2020 0908   MCV 95.2 11/01/2020 0908   MCH 32.4 11/01/2020 0908   MCHC 34.0 11/01/2020 0908   RDW 13.2 11/01/2020 0908   LYMPHSABS 1.5 11/01/2020 0908   MONOABS 0.4 11/01/2020 0908   EOSABS 0.3 11/01/2020 0908   BASOSABS 0.0 11/01/2020 0908   CMP Latest Ref Rng & Units 11/01/2020 04/25/2020 10/19/2019  Glucose 70 - 99 mg/dL 123(H) 140(H) 190(H)  BUN 8 - 23 mg/dL 24(H) 21 15  Creatinine 0.61 - 1.24 mg/dL 1.51(H) 1.22 1.15  Sodium 135 - 145 mmol/L 136 137 133(L)  Potassium 3.5 - 5.1 mmol/L 3.7 3.5 3.6  Chloride 98 - 111 mmol/L 99 101 97(L)  CO2 22 - 32 mmol/L _0 Calcium 8.9 - 10.3 mg/dL 9.0 8.9 9.2  Total Protein 6.5 - 8.1 g/dL 8.5(H) 8.0 8.4(H)  Total Bilirubin 0.3 - 1.2 mg/dL 0.6 0.9 0.7  Alkaline Phos 38 - 126 U/L 83 65 67  AST 15 - 41 U/L _1 ALT 0 - 44 U/L _2 DIAGNOSTIC IMAGING:  I have independently  reviewed the relevant imaging and discussed with the patient.  ASSESSMENT & PLAN: 1.  IgG kappa smoldering myeloma - Bone marrow biopsy on 07/19/2014 shows 10% plasma cells with normal cytogenetics and FISH panel. - PET scan on 08/14/2014 was negative for lytic lesions.  Subsequent CT scan of the chest in 2018 was also negative. - Skeletal survey on 11/01/2020 did not show any lytic lesions or sclerotic lesions. - He does not have any new onset bone pains.  Denies any recurrent infections.   - No B symptoms.  No new lumps or bumps. - Reviewed most recent labs (11/01/2020): M spike slightly increased from previous at 1.7, but stable within baseline range.  Kappa light chains elevated at 32.5 with normal lambda 19.9, and normal ratio 1.63.  Creatinine 1.51 and stable at baseline.  Calcium 9.0.  Hemoglobin 13.5, with normal CBC. - PLAN: No intervention needed at this time.  We will repeat labs in 6 months with follow-up visit.  Next skeletal survey will be due in September 2023.  2.  CKD stage III: - Baseline creatinine between 1.2-1.5. - Most recent CMP (11/01/2020): Creatinine 1.51, more or less at baseline  3.  Elevated blood pressure - Initial blood pressure was 191/75 - Patient is asymptomatic; no headache, dizziness, blurry vision, strokelike symptoms, chest pain, or dyspnea - Reports that he was stressed at the time of his appointment today due to running late because of traffic issues - Repeat blood pressure was 180/78 - Patient reports that his blood pressure is usually SBP <140 when he checks it at home - PLAN: Patient instructed to repeat blood pressure at home after resting for half an hour, and to contact his PCP if blood pressure is greater than 160/90.  Instructed  on alarm symptoms that would prompt 911 call and immediate medical attention.   PLAN SUMMARY & DISPOSITION: -Labs in 6 months - RTC after labs  All questions were answered. The patient knows to call the clinic with any  problems, questions or concerns.  Medical decision making: Low  Time spent on visit: I spent 20 minutes counseling the patient face to face. The total time spent in the appointment was 30 minutes and more than 50% was on counseling.   Harriett Rush, PA-C  11/07/2020 10:07 AM

## 2020-11-07 ENCOUNTER — Inpatient Hospital Stay (HOSPITAL_COMMUNITY): Payer: Medicare Other | Attending: Physician Assistant | Admitting: Physician Assistant

## 2020-11-07 VITALS — BP 191/75 | HR 61 | Temp 97.0°F | Resp 18 | Wt 179.6 lb

## 2020-11-07 DIAGNOSIS — D472 Monoclonal gammopathy: Secondary | ICD-10-CM | POA: Diagnosis not present

## 2020-11-07 DIAGNOSIS — Z7982 Long term (current) use of aspirin: Secondary | ICD-10-CM | POA: Insufficient documentation

## 2020-11-07 DIAGNOSIS — R5383 Other fatigue: Secondary | ICD-10-CM | POA: Insufficient documentation

## 2020-11-07 DIAGNOSIS — Z85820 Personal history of malignant melanoma of skin: Secondary | ICD-10-CM | POA: Insufficient documentation

## 2020-11-07 DIAGNOSIS — E7800 Pure hypercholesterolemia, unspecified: Secondary | ICD-10-CM | POA: Diagnosis not present

## 2020-11-07 DIAGNOSIS — N183 Chronic kidney disease, stage 3 unspecified: Secondary | ICD-10-CM | POA: Insufficient documentation

## 2020-11-07 DIAGNOSIS — C9000 Multiple myeloma not having achieved remission: Secondary | ICD-10-CM | POA: Insufficient documentation

## 2020-11-07 DIAGNOSIS — I129 Hypertensive chronic kidney disease with stage 1 through stage 4 chronic kidney disease, or unspecified chronic kidney disease: Secondary | ICD-10-CM | POA: Insufficient documentation

## 2020-11-07 DIAGNOSIS — Z7984 Long term (current) use of oral hypoglycemic drugs: Secondary | ICD-10-CM | POA: Diagnosis not present

## 2020-11-07 DIAGNOSIS — Z87891 Personal history of nicotine dependence: Secondary | ICD-10-CM | POA: Insufficient documentation

## 2020-11-07 DIAGNOSIS — E119 Type 2 diabetes mellitus without complications: Secondary | ICD-10-CM | POA: Insufficient documentation

## 2020-11-07 NOTE — Patient Instructions (Signed)
[place] at [facility] Discharge Instructions  You were seen today by [name] for your smoldering myeloma.  Your labs are stable, which show that you are still in a "precancerous state," and that you do not have any signs of progressing to cancer (multiple myeloma) at this time.  We will check your labs and see you back for another office visit in 6 months.  Your blood pressure today was elevated, but you tend to have elevated blood pressure when you come to our office.  Since you do not have any alarming symptoms right now, we will allow you to go home with his elevated blood pressure.  When you get home, please rest for about 30 minutes and then check your blood pressure at home.  If your blood pressure remains greater than 160/90, please call your primary care doctor for further instructions.  Call 911 and proceed directly to the emergency department if you have any chest pain, dizziness, blurry vision, headache, or strokelike symptoms.  LABS: Return in 6 months for repeat labs  OTHER TESTS: No other tests at this time  MEDICATIONS: No changes to home medications  FOLLOW-UP APPOINTMENT: Office visit in 6 months, 1 week after labs   Thank you for choosing [place] at [facility] to provide your oncology and hematology care.  To afford each patient quality time with our provider, please arrive at least 15 minutes before your scheduled appointment time.   If you have a lab appointment with the [facility] please come in thru the Main Entrance and check in at the main information desk.  You need to re-schedule your appointment should you arrive 10 or more minutes late.  We strive to give you quality time with our providers, and arriving late affects you and other patients whose appointments are after yours.  Also, if you no show three or more times for appointments you may be dismissed from the clinic at the providers discretion.      Again, thank you for choosing [facility].  Our hope is that these requests will decrease the amount of time that you wait before being seen by our physicians.       _____________________________________________________________  Should you have questions after your visit to [facility], please contact our office at [phone] and follow the prompts.  Our office hours are 8:00 a.m. and 4:30 p.m. Monday - Friday.  Please note that voicemails left after 4:00 p.m. may not be returned until the following business day.  We are closed weekends and major holidays.  You do have access to a nurse 24-7, just call the main number to the clinic [phone] and do not press any options, hold on the line and a nurse will answer the phone.    For prescription refill requests, have your pharmacy contact our office and allow 72 hours.    Due to Covid, you will need to wear a mask upon entering the hospital. If you do not have a mask, a mask will be given to you at the Main Entrance upon arrival. For doctor visits, patients may have 1 support person age 31 or older with them. For treatment visits, patients can not have anyone with them due to social distancing guidelines and our immunocompromised population.

## 2021-05-08 ENCOUNTER — Inpatient Hospital Stay (HOSPITAL_COMMUNITY): Payer: Medicare Other | Attending: Hematology

## 2021-05-08 DIAGNOSIS — N183 Chronic kidney disease, stage 3 unspecified: Secondary | ICD-10-CM | POA: Diagnosis not present

## 2021-05-08 DIAGNOSIS — D472 Monoclonal gammopathy: Secondary | ICD-10-CM | POA: Diagnosis not present

## 2021-05-08 DIAGNOSIS — Z87891 Personal history of nicotine dependence: Secondary | ICD-10-CM | POA: Insufficient documentation

## 2021-05-08 DIAGNOSIS — Z809 Family history of malignant neoplasm, unspecified: Secondary | ICD-10-CM | POA: Diagnosis not present

## 2021-05-08 DIAGNOSIS — I129 Hypertensive chronic kidney disease with stage 1 through stage 4 chronic kidney disease, or unspecified chronic kidney disease: Secondary | ICD-10-CM | POA: Diagnosis not present

## 2021-05-08 LAB — CBC WITH DIFFERENTIAL/PLATELET
Abs Immature Granulocytes: 0.01 10*3/uL (ref 0.00–0.07)
Basophils Absolute: 0 10*3/uL (ref 0.0–0.1)
Basophils Relative: 1 %
Eosinophils Absolute: 0.2 10*3/uL (ref 0.0–0.5)
Eosinophils Relative: 7 %
HCT: 38.8 % — ABNORMAL LOW (ref 39.0–52.0)
Hemoglobin: 13.1 g/dL (ref 13.0–17.0)
Immature Granulocytes: 0 %
Lymphocytes Relative: 37 %
Lymphs Abs: 1.2 10*3/uL (ref 0.7–4.0)
MCH: 31.5 pg (ref 26.0–34.0)
MCHC: 33.8 g/dL (ref 30.0–36.0)
MCV: 93.3 fL (ref 80.0–100.0)
Monocytes Absolute: 0.3 10*3/uL (ref 0.1–1.0)
Monocytes Relative: 10 %
Neutro Abs: 1.5 10*3/uL — ABNORMAL LOW (ref 1.7–7.7)
Neutrophils Relative %: 45 %
Platelets: 178 10*3/uL (ref 150–400)
RBC: 4.16 MIL/uL — ABNORMAL LOW (ref 4.22–5.81)
RDW: 13.4 % (ref 11.5–15.5)
WBC: 3.3 10*3/uL — ABNORMAL LOW (ref 4.0–10.5)
nRBC: 0 % (ref 0.0–0.2)

## 2021-05-08 LAB — COMPREHENSIVE METABOLIC PANEL
ALT: 26 U/L (ref 0–44)
AST: 24 U/L (ref 15–41)
Albumin: 4.2 g/dL (ref 3.5–5.0)
Alkaline Phosphatase: 69 U/L (ref 38–126)
Anion gap: 9 (ref 5–15)
BUN: 27 mg/dL — ABNORMAL HIGH (ref 8–23)
CO2: 29 mmol/L (ref 22–32)
Calcium: 9.1 mg/dL (ref 8.9–10.3)
Chloride: 100 mmol/L (ref 98–111)
Creatinine, Ser: 1.36 mg/dL — ABNORMAL HIGH (ref 0.61–1.24)
GFR, Estimated: 50 mL/min — ABNORMAL LOW (ref >60)
Glucose, Bld: 129 mg/dL — ABNORMAL HIGH (ref 70–99)
Potassium: 3.8 mmol/L (ref 3.5–5.1)
Sodium: 138 mmol/L (ref 135–145)
Total Bilirubin: 0.8 mg/dL (ref 0.3–1.2)
Total Protein: 8.4 g/dL — ABNORMAL HIGH (ref 6.5–8.1)

## 2021-05-08 LAB — LACTATE DEHYDROGENASE: LDH: 133 U/L (ref 98–192)

## 2021-05-09 LAB — KAPPA/LAMBDA LIGHT CHAINS
Kappa free light chain: 27.3 mg/L — ABNORMAL HIGH (ref 3.3–19.4)
Kappa, lambda light chain ratio: 2.17 — ABNORMAL HIGH (ref 0.26–1.65)
Lambda free light chains: 12.6 mg/L (ref 5.7–26.3)

## 2021-05-12 LAB — PROTEIN ELECTROPHORESIS, SERUM
A/G Ratio: 1 (ref 0.7–1.7)
Albumin ELP: 3.8 g/dL (ref 2.9–4.4)
Alpha-1-Globulin: 0.2 g/dL (ref 0.0–0.4)
Alpha-2-Globulin: 0.7 g/dL (ref 0.4–1.0)
Beta Globulin: 0.8 g/dL (ref 0.7–1.3)
Gamma Globulin: 2 g/dL — ABNORMAL HIGH (ref 0.4–1.8)
Globulin, Total: 3.8 g/dL (ref 2.2–3.9)
M-Spike, %: 1.6 g/dL — ABNORMAL HIGH
Total Protein ELP: 7.6 g/dL (ref 6.0–8.5)

## 2021-05-14 NOTE — Progress Notes (Signed)
? ?Maurice Cooper ?[address] ?[place], [place] [address] ? ? ?CLINIC:  ?Medical Oncology/Hematology ? ?PCP:  ?Marrs, Ronnie Derby, FNP (Inactive) ?[address] ?[place] [address] ?[phone] ? ? ?REASON FOR VISIT:  ?Follow-up for smoldering multiple myeloma ?  ?PRIOR THERAPY: None ?  ?CURRENT THERAPY: Surveillance ? ?INTERVAL HISTORY:  ?Maurice Cooper 88 y.o. male returns for routine follow-up of his smoldering myeloma.  He was last seen on 11/07/2020 by [name]. ? ?At today's visit, he reports feeling fairly well.  No recent hospitalizations, surgeries, or changes in baseline health status. ? ?He denies any new bone pain or recent fractures. ?He denies any B symptoms such as fever, chills, night sweats, unintentional weight loss.[facility]   ?No new neurologic symptoms such as tinnitus, new-onset hearing loss, blurred vision, headache, or dizziness.  Denies any numbness or tingling in hands or feet. ?No thromboembolic events since his last visit.  ?No new masses or lymphadenopathy per his report. ? ?He has 75% energy and 100% appetite. He endorses that he is maintaining a stable weight. ? ? ?REVIEW OF SYSTEMS:  ?Review of Systems  ?Constitutional:  Positive for fatigue. Negative for appetite change, chills, diaphoresis, fever and unexpected weight change.  ?HENT:   Negative for lump/mass and nosebleeds.   ?Eyes:  Negative for eye problems.  ?Respiratory:  Negative for cough, hemoptysis and shortness of breath.   ?Cardiovascular:  Negative for chest pain, leg swelling and palpitations.  ?Gastrointestinal:  Negative for abdominal pain, blood in stool, constipation, diarrhea, nausea and vomiting.  ?Genitourinary:  Negative for hematuria.   ?Skin: Negative.   ?Neurological:  Negative for dizziness, headaches and light-headedness.  ?Hematological:  Does not bruise/bleed easily.   ? ? ?PAST MEDICAL/SURGICAL HISTORY:  ?Past Medical History:  ?Diagnosis Date  ? Amputation of index finger   ? Chronic  kidney disease, stage 2 (mild)   ? Diabetes ([place])   ? Diabetes mellitus ([place])   ? Disorder of the skin and subcutaneous tissue, unspecified   ? Glaucoma   ? High cholesterol   ? Hypercholesteremia   ? Hypertension   ? Malignant neoplasm of external lower lip   ? Melanoma ([place])   ? lower back  ? Monoclonal gammopathy   ? Multiple myeloma not having achieved remission ([place])   ? Other pancytopenia ([place]) 02/25/2014  ? Personal history of other malignant neoplasm of skin   ? Smoldering multiple myeloma (SMM) ([place]) 02/25/2014  ? Unspecified glaucoma   ? ?Past Surgical History:  ?Procedure Laterality Date  ? AMPUTATION FINGER / THUMB Left   ? forefinger  ? BONE MARROW ASPIRATION Left 07/19/14  ? BONE MARROW BIOPSY Left 07/19/14  ? CATARACT EXTRACTION Bilateral   ? CORNEAL TRANSPLANT Right   ? MELANOMA EXCISION Left   ? left flank area 1980s  ? TONSILLECTOMY    ? ? ? ?SOCIAL HISTORY:  ?Social History  ? ?Socioeconomic History  ? Marital status: Widowed  ?  Spouse name: Not on file  ? Number of children: Not on file  ? Years of education: Not on file  ? Highest education level: Not on file  ?Occupational History  ? Not on file  ?Tobacco Use  ? Smoking status: Former  ?  Types: Cigarettes  ? Smokeless tobacco: Former  ?  Types: Chew  ?Vaping Use  ? Vaping Use: Never used  ?Substance and Sexual Activity  ? Alcohol use: No  ? Drug use: No  ? Sexual activity:  Not on file  ?Other Topics Concern  ? Not on file  ?Social History Narrative  ? Not on file  ? ?Social Determinants of Health  ? ?Financial Resource Strain: Not on file  ?Food Insecurity: Not on file  ?Transportation Needs: Not on file  ?Physical Activity: Not on file  ?Stress: Not on file  ?Social Connections: Not on file  ?Intimate Partner Violence: Not on file  ? ? ?FAMILY HISTORY:  ?Family History  ?Problem Relation Age of Onset  ? Cancer Mother   ? Stroke Father   ? ? ?CURRENT MEDICATIONS:  ?Outpatient Encounter Medications as of 05/15/2021  ?Medication Sig  ? amLODipine  (NORVASC) 10 MG tablet Take 10 mg by mouth daily.   ? aspirin EC 325 MG tablet Take 325 mg by mouth daily.  ? doxazosin (CARDURA) 4 MG tablet Take 4 mg by mouth daily.   ? finasteride (PROSCAR) 5 MG tablet Take 5 mg by mouth daily.  ? glipiZIDE (GLUCOTROL XL) 5 MG 24 hr tablet Take 5 mg by mouth daily with breakfast.   ? JARDIANCE 10 MG TABS tablet Take 25 mg by mouth every morning.  ? ketoconazole (NIZORAL) 2 % cream Apply 1 application topically 2 (two) times daily. Apply to feet  ? levothyroxine (SYNTHROID) 25 MCG tablet Take 25 mcg by mouth daily.  ? metoprolol (LOPRESSOR) 100 MG tablet Take 100 mg by mouth 2 (two) times daily.  ? Multiple Vitamins-Minerals (CENTRUM SILVER PO) Take 1 capsule by mouth daily.  ? OneTouch Delica Lancets 02O MISC Apply topically daily.  ? ONETOUCH ULTRA test strip daily. for testing  ? potassium chloride SA (K-DUR,KLOR-CON) 20 MEQ tablet Take 20 mEq by mouth daily.  ? pravastatin (PRAVACHOL) 40 MG tablet Take 40 mg by mouth daily.  ? timolol (TIMOPTIC) 0.5 % ophthalmic solution Place 1 drop into the right eye daily.   ? triamcinolone (KENALOG) 0.1 % SMARTSIG:1 Application Topical 2-3 Times Daily  ? valsartan-hydrochlorothiazide (DIOVAN-HCT) 160-12.5 MG per tablet Take 1 tablet by mouth daily.  ? ?No facility-administered encounter medications on file as of 05/15/2021.  ? ? ?ALLERGIES:  ?No Known Allergies ? ? ?PHYSICAL EXAM:  ?ECOG PERFORMANCE STATUS: 0 - Asymptomatic ? ?There were no vitals filed for this visit. ?There were no vitals filed for this visit. ?Physical Exam ?Constitutional:   ?   Appearance: Normal appearance.  ?HENT:  ?   Head: Normocephalic and atraumatic.  ?   Mouth/Throat:  ?   Mouth: Mucous membranes are moist.  ?Eyes:  ?   Extraocular Movements: Extraocular movements intact.  ?   Pupils: Pupils are equal, round, and reactive to light.  ?Cardiovascular:  ?   Rate and Rhythm: Normal rate and regular rhythm.  ?   Pulses: Normal pulses.  ?   Heart sounds: Normal heart  sounds.  ?Pulmonary:  ?   Effort: Pulmonary effort is normal.  ?   Breath sounds: Normal breath sounds.  ?Abdominal:  ?   General: Bowel sounds are normal.  ?   Palpations: Abdomen is soft.  ?   Tenderness: There is no abdominal tenderness.  ?Musculoskeletal:     ?   General: No swelling.  ?   Right lower leg: No edema.  ?   Left lower leg: No edema.  ?Lymphadenopathy:  ?   Head:  ?   Right side of head: No submental, submandibular, tonsillar, preauricular, posterior auricular or occipital adenopathy.  ?   Left side of head: No submental, submandibular,  tonsillar, preauricular, posterior auricular or occipital adenopathy.  ?   Cervical: No cervical adenopathy.  ?Skin: ?   General: Skin is warm and dry.  ?Neurological:  ?   General: No focal deficit present.  ?   Mental Status: He is alert and oriented to person, place, and time.  ?Psychiatric:     ?   Mood and Affect: Mood normal.     ?   Behavior: Behavior normal.  ? ? ? ?LABORATORY DATA:  ?I have reviewed the labs as listed.  ?CBC ?   ?Component Value Date/Time  ? WBC 3.3 (L) 05/08/2021 1052  ? RBC 4.16 (L) 05/08/2021 1052  ? HGB 13.1 05/08/2021 1052  ? HCT 38.8 (L) 05/08/2021 1052  ? PLT 178 05/08/2021 1052  ? MCV 93.3 05/08/2021 1052  ? MCH 31.5 05/08/2021 1052  ? MCHC 33.8 05/08/2021 1052  ? RDW 13.4 05/08/2021 1052  ? LYMPHSABS 1.2 05/08/2021 1052  ? MONOABS 0.3 05/08/2021 1052  ? EOSABS 0.2 05/08/2021 1052  ? BASOSABS 0.0 05/08/2021 1052  ? ? ?  Latest Ref Rng & Units 05/08/2021  ? 10:52 AM 11/01/2020  ?  9:08 AM 04/25/2020  ? 10:07 AM  ?CMP  ?Glucose 70 - 99 mg/dL 129   123   140    ?BUN 8 - 23 mg/dL 27   24   21     ?Creatinine 0.61 - 1.24 mg/dL 1.36   1.51   1.22    ?Sodium 135 - 145 mmol/L 138   136   137    ?Potassium 3.5 - 5.1 mmol/L 3.8   3.7   3.5    ?Chloride 98 - 111 mmol/L 100   99   101    ?CO2 22 - 32 mmol/L 29   30   28     ?Calcium 8.9 - 10.3 mg/dL 9.1   9.0   8.9    ?Total Protein 6.5 - 8.1 g/dL 8.4   8.5   8.0    ?Total Bilirubin 0.3 - 1.2 mg/dL  0.8   0.6   0.9    ?Alkaline Phos 38 - 126 U/L 69   83   65    ?AST 15 - 41 U/L 24   25   24     ?ALT 0 - 44 U/L 26   25   22     ? ? ?DIAGNOSTIC IMAGING:  ?I have independently reviewed the relevant imaging a

## 2021-05-15 ENCOUNTER — Inpatient Hospital Stay (HOSPITAL_COMMUNITY): Payer: Medicare Other | Admitting: Physician Assistant

## 2021-05-15 VITALS — BP 185/77 | HR 61 | Temp 97.0°F | Resp 18 | Ht 69.0 in | Wt 179.0 lb

## 2021-05-15 DIAGNOSIS — D472 Monoclonal gammopathy: Secondary | ICD-10-CM | POA: Diagnosis not present

## 2021-05-15 NOTE — Patient Instructions (Signed)
[place] at [facility] ?Discharge Instructions ? ?You were seen today by [name] for your smoldering myeloma.  Your labs are stable, which show that you are still in a "precancerous state," and that you do not have any signs of progressing to cancer (multiple myeloma) at this time.  We will check your labs and Xrays of your whole skeleton in 6 months.  We will see you back for another office visit in 6 months (after labs and Xrays). ? ?Your blood pressure today was elevated, but you tend to have elevated blood pressure when you come to our office.  Since you do not have any alarming symptoms right now, we will allow you to go home with his elevated blood pressure.  When you get home, please rest for about 30 minutes and then check your blood pressure at home.  If your blood pressure remains greater than 160/90, please call your primary care doctor for further instructions.  Call 911 and proceed directly to the emergency department if you have any chest pain, dizziness, blurry vision, headache, or strokelike symptoms. ? ?LABS: Return in 6 months for repeat labs ? ?OTHER TESTS: No other tests at this time ? ?MEDICATIONS: No changes to home medications ? ?FOLLOW-UP APPOINTMENT: Office visit in 6 months, 1 week after labs ? ? ?Thank you for choosing [place] at [facility] to provide your oncology and hematology care.  To afford each patient quality time with our provider, please arrive at least 15 minutes before your scheduled appointment time.  ? ?If you have a lab appointment with the [facility] please come in thru the Main Entrance and check in at the main information desk. ? ?You need to re-schedule your appointment should you arrive 10 or more minutes late.  We strive to give you quality time with our providers, and arriving late affects you and other patients whose appointments are after yours.  Also, if you no show three or more times for  appointments you may be dismissed from the clinic at the providers discretion.     ?Again, thank you for choosing [facility].  Our hope is that these requests will decrease the amount of time that you wait before being seen by our physicians.       ?_____________________________________________________________ ? ?Should you have questions after your visit to [facility], please contact our office at [phone] and follow the prompts.  Our office hours are 8:00 a.m. and 4:30 p.m. Monday - Friday.  Please note that voicemails left after 4:00 p.m. may not be returned until the following business day.  We are closed weekends and major holidays.  You do have access to a nurse 24-7, just call the main number to the clinic [phone] and do not press any options, hold on the line and a nurse will answer the phone.   ? ?For prescription refill requests, have your pharmacy contact our office and allow 72 hours.   ? ?Due to Covid, you will need to wear a mask upon entering the hospital. If you do not have a mask, a mask will be given to you at the Main Entrance upon arrival. For doctor visits, patients may have 1 support person age 72 or older with them. For treatment visits, patients can not have anyone with them due to social distancing guidelines and our immunocompromised population.  ? ? ? ?

## 2021-11-14 ENCOUNTER — Inpatient Hospital Stay: Payer: Medicare Other | Attending: Physician Assistant

## 2021-11-14 ENCOUNTER — Ambulatory Visit (HOSPITAL_COMMUNITY)
Admission: RE | Admit: 2021-11-14 | Discharge: 2021-11-14 | Disposition: A | Discharge status: Home / Self Care | Payer: Medicare Other | Source: Ambulatory Visit | Attending: Physician Assistant | Admitting: Physician Assistant

## 2021-11-14 DIAGNOSIS — D472 Monoclonal gammopathy: Secondary | ICD-10-CM | POA: Diagnosis not present

## 2021-11-14 LAB — COMPREHENSIVE METABOLIC PANEL
ALT: 26 U/L (ref 0–44)
AST: 24 U/L (ref 15–41)
Albumin: 4.2 g/dL (ref 3.5–5.0)
Alkaline Phosphatase: 69 U/L (ref 38–126)
Anion gap: 8 (ref 5–15)
BUN: 23 mg/dL (ref 8–23)
CO2: 29 mmol/L (ref 22–32)
Calcium: 9.4 mg/dL (ref 8.9–10.3)
Chloride: 102 mmol/L (ref 98–111)
Creatinine, Ser: 1.24 mg/dL (ref 0.61–1.24)
GFR, Estimated: 56 mL/min — ABNORMAL LOW (ref >60)
Glucose, Bld: 138 mg/dL — ABNORMAL HIGH (ref 70–99)
Potassium: 3.5 mmol/L (ref 3.5–5.1)
Sodium: 139 mmol/L (ref 135–145)
Total Bilirubin: 0.8 mg/dL (ref 0.3–1.2)
Total Protein: 8.3 g/dL — ABNORMAL HIGH (ref 6.5–8.1)

## 2021-11-14 LAB — CBC WITH DIFFERENTIAL/PLATELET
Abs Immature Granulocytes: 0.01 10*3/uL (ref 0.00–0.07)
Basophils Absolute: 0 10*3/uL (ref 0.0–0.1)
Basophils Relative: 1 %
Eosinophils Absolute: 0.2 10*3/uL (ref 0.0–0.5)
Eosinophils Relative: 6 %
HCT: 39.3 % (ref 39.0–52.0)
Hemoglobin: 13.4 g/dL (ref 13.0–17.0)
Immature Granulocytes: 0 %
Lymphocytes Relative: 33 %
Lymphs Abs: 1.2 10*3/uL (ref 0.7–4.0)
MCH: 31.8 pg (ref 26.0–34.0)
MCHC: 34.1 g/dL (ref 30.0–36.0)
MCV: 93.3 fL (ref 80.0–100.0)
Monocytes Absolute: 0.3 10*3/uL (ref 0.1–1.0)
Monocytes Relative: 7 %
Neutro Abs: 2 10*3/uL (ref 1.7–7.7)
Neutrophils Relative %: 53 %
Platelets: 176 10*3/uL (ref 150–400)
RBC: 4.21 MIL/uL — ABNORMAL LOW (ref 4.22–5.81)
RDW: 13.5 % (ref 11.5–15.5)
WBC: 3.7 10*3/uL — ABNORMAL LOW (ref 4.0–10.5)
nRBC: 0 % (ref 0.0–0.2)

## 2021-11-14 LAB — LACTATE DEHYDROGENASE: LDH: 140 U/L (ref 98–192)

## 2021-11-17 LAB — KAPPA/LAMBDA LIGHT CHAINS
Kappa free light chain: 28.2 mg/L — ABNORMAL HIGH (ref 3.3–19.4)
Kappa, lambda light chain ratio: 1.84 — ABNORMAL HIGH (ref 0.26–1.65)
Lambda free light chains: 15.3 mg/L (ref 5.7–26.3)

## 2021-11-18 LAB — PROTEIN ELECTROPHORESIS, SERUM
A/G Ratio: 1.1 (ref 0.7–1.7)
Albumin ELP: 4.1 g/dL (ref 2.9–4.4)
Alpha-1-Globulin: 0.2 g/dL (ref 0.0–0.4)
Alpha-2-Globulin: 0.7 g/dL (ref 0.4–1.0)
Beta Globulin: 0.9 g/dL (ref 0.7–1.3)
Gamma Globulin: 2 g/dL — ABNORMAL HIGH (ref 0.4–1.8)
Globulin, Total: 3.9 g/dL (ref 2.2–3.9)
M-Spike, %: 1.6 g/dL — ABNORMAL HIGH
Total Protein ELP: 8 g/dL (ref 6.0–8.5)

## 2021-11-21 ENCOUNTER — Ambulatory Visit: Payer: Medicare Other | Admitting: Physician Assistant

## 2021-11-24 ENCOUNTER — Inpatient Hospital Stay: Payer: Medicare Other | Admitting: Physician Assistant

## 2021-12-22 NOTE — Progress Notes (Unsigned)
Maurice Cooper, [place] [address]   CLINIC:  Medical Oncology/Hematology  PCP:  Garald Braver, FNP [address] [place] [address] [phone]   REASON FOR VISIT:  Follow-up for smoldering multiple myeloma   PRIOR THERAPY: None   CURRENT THERAPY: Surveillance  INTERVAL HISTORY:  [name] 88 y.o. male returns for routine follow-up of his smoldering myeloma.  He was last seen on 05/15/2021 by [name].  At today's visit, he reports feeling well.  No recent hospitalizations, surgeries, or changes in baseline health status.  He denies any new onset bone pain or recent fractures.  He denies any B symptoms such as fever, chills, night sweats, unintentional weight loss.  No new neurologic symptoms such as tinnitus, new-onset hearing loss, blurred vision, headache, or dizziness. Denies any numbness or tingling in hands or feet.  No thromboembolic events since his last visit.   No new masses or lymphadenopathy per his report.  He has 75% energy and 100% appetite. He endorses that he is maintaining a stable weight.   REVIEW OF SYSTEMS:  Review of Systems  Constitutional:  Positive for fatigue. Negative for appetite change, chills, diaphoresis, fever and unexpected weight change.  HENT:   Negative for lump/mass and nosebleeds.   Eyes:  Negative for eye problems.  Respiratory:  Negative for cough, hemoptysis and shortness of breath.   Cardiovascular:  Negative for chest pain, leg swelling and palpitations.  Gastrointestinal:  Negative for abdominal pain, blood in stool, constipation, diarrhea, nausea and vomiting.  Genitourinary:  Negative for hematuria.   Skin: Negative.   Neurological:  Negative for dizziness, headaches and light-headedness.  Hematological:  Does not bruise/bleed easily.      PAST MEDICAL/SURGICAL HISTORY:  Past Medical History:  Diagnosis Date   Amputation of index finger    Chronic kidney disease,  stage 2 (mild)    Diabetes ([place])    Diabetes mellitus ([place])    Disorder of the skin and subcutaneous tissue, unspecified    Glaucoma    High cholesterol    Hypercholesteremia    Hypertension    Malignant neoplasm of external lower lip    Melanoma ([facility])    lower back   Monoclonal gammopathy    Multiple myeloma not having achieved remission ([place])    Other pancytopenia ([place]) 02/25/2014   Personal history of other malignant neoplasm of skin    Smoldering multiple myeloma (SMM) ([place]) 02/25/2014   Unspecified glaucoma    Past Surgical History:  Procedure Laterality Date   AMPUTATION FINGER / THUMB Left    forefinger   BONE MARROW ASPIRATION Left 07/19/14   BONE MARROW BIOPSY Left 07/19/14   CATARACT EXTRACTION Bilateral    CORNEAL TRANSPLANT Right    MELANOMA EXCISION Left    left flank area 1980s   TONSILLECTOMY       SOCIAL HISTORY:  Social History   Socioeconomic History   Marital status: Widowed    Spouse name: Not on file   Number of children: Not on file   Years of education: Not on file   Highest education level: Not on file  Occupational History   Not on file  Tobacco Use   Smoking status: Former    Types: Cigarettes   Smokeless tobacco: Former    Types: Nurse, children's Use: Never used  Substance and Sexual Activity   Alcohol use: No   Drug use: No   Sexual  activity: Not on file  Other Topics Concern   Not on file  Social History Narrative   Not on file   Social Determinants of Health   Financial Resource Strain: Not on file  Food Insecurity: Not on file  Transportation Needs: Not on file  Physical Activity: Not on file  Stress: Not on file  Social Connections: Not on file  Intimate Partner Violence: Not on file    FAMILY HISTORY:  Family History  Problem Relation Age of Onset   Cancer Mother    Stroke Father     CURRENT MEDICATIONS:  Outpatient Encounter Medications as of 12/23/2021  Medication Sig   amLODipine (NORVASC) 10 MG  tablet Take 10 mg by mouth daily.    aspirin EC 325 MG tablet Take 325 mg by mouth daily.   doxazosin (CARDURA) 4 MG tablet Take 4 mg by mouth daily.    finasteride (PROSCAR) 5 MG tablet Take 5 mg by mouth daily.   glipiZIDE (GLUCOTROL XL) 5 MG 24 hr tablet Take 5 mg by mouth daily with breakfast.    JARDIANCE 10 MG TABS tablet Take 25 mg by mouth every morning.   ketoconazole (NIZORAL) 2 % cream Apply 1 application topically 2 (two) times daily. Apply to feet   levothyroxine (SYNTHROID) 25 MCG tablet Take 25 mcg by mouth daily.   metoprolol (LOPRESSOR) 100 MG tablet Take 100 mg by mouth 2 (two) times daily.   Multiple Vitamins-Minerals (CENTRUM SILVER PO) Take 1 capsule by mouth daily.   OneTouch Delica Lancets 53Z MISC Apply topically daily.   ONETOUCH ULTRA test strip daily. for testing   potassium chloride SA (K-DUR,KLOR-CON) 20 MEQ tablet Take 20 mEq by mouth daily.   pravastatin (PRAVACHOL) 40 MG tablet Take 40 mg by mouth daily.   timolol (TIMOPTIC) 0.5 % ophthalmic solution Place 1 drop into the right eye daily.    triamcinolone (KENALOG) 0.1 % SMARTSIG:1 Application Topical 2-3 Times Daily   valsartan-hydrochlorothiazide (DIOVAN-HCT) 160-12.5 MG per tablet Take 1 tablet by mouth daily.   No facility-administered encounter medications on file as of 12/23/2021.    ALLERGIES:  No Known Allergies   PHYSICAL EXAM:  ECOG PERFORMANCE STATUS: 0 - Asymptomatic  There were no vitals filed for this visit. There were no vitals filed for this visit. Physical Exam Constitutional:      Appearance: Normal appearance.  HENT:     Head: Normocephalic and atraumatic.     Mouth/Throat:     Mouth: Mucous membranes are moist.  Eyes:     Extraocular Movements: Extraocular movements intact.     Pupils: Pupils are equal, round, and reactive to light.  Cardiovascular:     Rate and Rhythm: Normal rate and regular rhythm.     Pulses: Normal pulses.     Heart sounds: Normal heart sounds.   Pulmonary:     Effort: Pulmonary effort is normal.     Breath sounds: Normal breath sounds.  Abdominal:     General: Bowel sounds are normal.     Palpations: Abdomen is soft.     Tenderness: There is no abdominal tenderness.  Musculoskeletal:        General: No swelling.     Right lower leg: No edema.     Left lower leg: No edema.  Lymphadenopathy:     Head:     Right side of head: No submental, submandibular, tonsillar, preauricular, posterior auricular or occipital adenopathy.     Left side of head: No submental,  submandibular, tonsillar, preauricular, posterior auricular or occipital adenopathy.     Cervical: No cervical adenopathy.  Skin:    General: Skin is warm and dry.  Neurological:     General: No focal deficit present.     Mental Status: He is alert and oriented to person, place, and time.  Psychiatric:        Mood and Affect: Mood normal.        Behavior: Behavior normal.      LABORATORY DATA:  I have reviewed the labs as listed.  CBC    Component Value Date/Time   WBC 3.7 (L) 11/14/2021 1158   RBC 4.21 (L) 11/14/2021 1158   HGB 13.4 11/14/2021 1158   HCT 39.3 11/14/2021 1158   PLT 176 11/14/2021 1158   MCV 93.3 11/14/2021 1158   MCH 31.8 11/14/2021 1158   MCHC 34.1 11/14/2021 1158   RDW 13.5 11/14/2021 1158   LYMPHSABS 1.2 11/14/2021 1158   MONOABS 0.3 11/14/2021 1158   EOSABS 0.2 11/14/2021 1158   BASOSABS 0.0 11/14/2021 1158      Latest Ref Rng & Units 11/14/2021   11:58 AM 05/08/2021   10:52 AM 11/01/2020    9:08 AM  CMP  Glucose 70 - 99 mg/dL 138  129  123   BUN 8 - 23 mg/dL _0 Creatinine 0.61 - 1.24 mg/dL 1.24  1.36  1.51   Sodium 135 - 145 mmol/L 139  138  136   Potassium 3.5 - 5.1 mmol/L 3.5  3.8  3.7   Chloride 98 - 111 mmol/L 102  100  99   CO2 22 - 32 mmol/L _1 Calcium 8.9 - 10.3 mg/dL 9.4  9.1  9.0   Total Protein 6.5 - 8.1 g/dL 8.3  8.4  8.5   Total Bilirubin 0.3 - 1.2 mg/dL 0.8  0.8  0.6   Alkaline Phos 38 -  126 U/L 69  69  83   AST 15 - 41 U/L _2 ALT 0 - 44 U/L _3 DIAGNOSTIC IMAGING:  I have independently reviewed the relevant imaging and discussed with the patient.  ASSESSMENT & PLAN: 1.  IgG kappa smoldering myeloma - Bone marrow biopsy on 07/19/2014 shows 10% plasma cells with normal cytogenetics and FISH panel. - PET scan on 08/14/2014 was negative for lytic lesions.  Subsequent CT scan of the chest in 2018 was also negative. - Most recent skeletal survey on 11/14/2021 did not show any lytic or lucent lesions to suggest myeloma - He does not have any new onset bone pains.  Denies any recurrent infections.     - No B symptoms.  No new lumps or bumps.  - Reviewed most recent labs (11/14/2021): SPEP shows a stable M spike 1.6.  Stable light chains with elevated kappa 28.2, normal lambda 15.3, and elevated ratio 1.84.  Creatinine 1.24/GFR 56, stable at baseline.  Calcium 9.4.  Hgb 13.4.  LDH normal. - PLAN: No intervention needed at this time.  We will repeat labs in 6 months with follow-up visit.  (Skeletal survey due annually, next in October 2024)   2.  CKD stage III: - Baseline creatinine between 1.2-1.5.  3.  Leukopenia - WBCs are chronically low, ranging from 2.8-5.5 dating back to at least 2015    PLAN SUMMARY: >> Labs in 6 months (CBC/D, CMP, SPEP, light chains,  LDH) >> Office visit in 6 months, 1 week after labs   All questions were answered. The patient knows to call the clinic with any problems, questions or concerns.  Medical decision making: Moderate  Time spent on visit: I spent 20 minutes counseling the patient face to face. The total time spent in the appointment was 30 minutes and more than 50% was on counseling.   Harriett Rush, PA-C  12/23/21 9:52 AM

## 2021-12-23 ENCOUNTER — Other Ambulatory Visit: Payer: Self-pay

## 2021-12-23 ENCOUNTER — Encounter: Payer: Self-pay | Admitting: Physician Assistant

## 2021-12-23 ENCOUNTER — Inpatient Hospital Stay: Payer: Medicare Other | Attending: Physician Assistant | Admitting: Physician Assistant

## 2021-12-23 VITALS — BP 170/72 | HR 58 | Temp 98.4°F | Resp 18 | Wt 172.9 lb

## 2021-12-23 DIAGNOSIS — N182 Chronic kidney disease, stage 2 (mild): Secondary | ICD-10-CM | POA: Diagnosis not present

## 2021-12-23 DIAGNOSIS — E7800 Pure hypercholesterolemia, unspecified: Secondary | ICD-10-CM | POA: Insufficient documentation

## 2021-12-23 DIAGNOSIS — C9000 Multiple myeloma not having achieved remission: Secondary | ICD-10-CM | POA: Insufficient documentation

## 2021-12-23 DIAGNOSIS — I129 Hypertensive chronic kidney disease with stage 1 through stage 4 chronic kidney disease, or unspecified chronic kidney disease: Secondary | ICD-10-CM | POA: Insufficient documentation

## 2021-12-23 DIAGNOSIS — Z79899 Other long term (current) drug therapy: Secondary | ICD-10-CM | POA: Insufficient documentation

## 2021-12-23 DIAGNOSIS — Z7982 Long term (current) use of aspirin: Secondary | ICD-10-CM | POA: Insufficient documentation

## 2021-12-23 DIAGNOSIS — D472 Monoclonal gammopathy: Secondary | ICD-10-CM | POA: Diagnosis not present

## 2021-12-23 DIAGNOSIS — E1122 Type 2 diabetes mellitus with diabetic chronic kidney disease: Secondary | ICD-10-CM | POA: Diagnosis not present

## 2021-12-23 DIAGNOSIS — Z87891 Personal history of nicotine dependence: Secondary | ICD-10-CM | POA: Diagnosis not present

## 2021-12-23 DIAGNOSIS — Z7984 Long term (current) use of oral hypoglycemic drugs: Secondary | ICD-10-CM | POA: Diagnosis not present

## 2021-12-23 NOTE — Patient Instructions (Addendum)
[place] at [facility] Discharge Instructions  You were seen today by [name] for your smoldering myeloma.  Your labs are stable, which show that you are still in a "precancerous state," and that you do not have any signs of progressing to cancer (multiple myeloma) at this time.  We will check your labs in 6 months.  We will see you back for another office visit in 6 months (after labs).  LABS: Return in 6 months for repeat labs  OTHER TESTS: No other tests at this time  MEDICATIONS: No changes to home medications  FOLLOW-UP APPOINTMENT: Office visit in 6 months, 1 week after labs   Thank you for choosing [place] at [facility] to provide your oncology and hematology care.  To afford each patient quality time with our provider, please arrive at least 15 minutes before your scheduled appointment time.   If you have a lab appointment with the [facility] please come in thru the Main Entrance and check in at the main information desk.  You need to re-schedule your appointment should you arrive 10 or more minutes late.  We strive to give you quality time with our providers, and arriving late affects you and other patients whose appointments are after yours.  Also, if you no show three or more times for appointments you may be dismissed from the clinic at the providers discretion.     Again, thank you for choosing [facility]  Our hope is that these requests will decrease the amount of time that you wait before being seen by our physicians.       _____________________________________________________________  Should you have questions after your visit to [facility], please contact our office at [phone] and follow the prompts.  Our office hours are 8:00 a.m. and 4:30 p.m. Monday - Friday.  Please note that voicemails left after 4:00 p.m. may not be returned until the following business day.  We are  closed weekends and major holidays.  You do have access to a nurse 24-7, just call the main number to the clinic [phone] and do not press any options, hold on the line and a nurse will answer the phone.    For prescription refill requests, have your pharmacy contact our office and allow 72 hours.    Due to Covid, you will need to wear a mask upon entering the hospital. If you do not have a mask, a mask will be given to you at the Main Entrance upon arrival. For doctor visits, patients may have 1 support person age 86 or older with them. For treatment visits, patients can not have anyone with them due to social distancing guidelines and our immunocompromised population.

## 2022-06-16 ENCOUNTER — Inpatient Hospital Stay: Payer: Medicare Other | Attending: Physician Assistant

## 2022-06-16 DIAGNOSIS — I129 Hypertensive chronic kidney disease with stage 1 through stage 4 chronic kidney disease, or unspecified chronic kidney disease: Secondary | ICD-10-CM | POA: Diagnosis not present

## 2022-06-16 DIAGNOSIS — N183 Chronic kidney disease, stage 3 unspecified: Secondary | ICD-10-CM | POA: Diagnosis not present

## 2022-06-16 DIAGNOSIS — Z87891 Personal history of nicotine dependence: Secondary | ICD-10-CM | POA: Diagnosis not present

## 2022-06-16 DIAGNOSIS — D472 Monoclonal gammopathy: Secondary | ICD-10-CM | POA: Diagnosis present

## 2022-06-16 DIAGNOSIS — D72819 Decreased white blood cell count, unspecified: Secondary | ICD-10-CM | POA: Diagnosis not present

## 2022-06-16 DIAGNOSIS — Z85820 Personal history of malignant melanoma of skin: Secondary | ICD-10-CM | POA: Insufficient documentation

## 2022-06-16 LAB — CBC WITH DIFFERENTIAL/PLATELET
Abs Immature Granulocytes: 0.01 10*3/uL (ref 0.00–0.07)
Basophils Absolute: 0 10*3/uL (ref 0.0–0.1)
Basophils Relative: 1 %
Eosinophils Absolute: 0.3 10*3/uL (ref 0.0–0.5)
Eosinophils Relative: 6 %
HCT: 40.4 % (ref 39.0–52.0)
Hemoglobin: 14 g/dL (ref 13.0–17.0)
Immature Granulocytes: 0 %
Lymphocytes Relative: 32 %
Lymphs Abs: 1.4 10*3/uL (ref 0.7–4.0)
MCH: 32.2 pg (ref 26.0–34.0)
MCHC: 34.7 g/dL (ref 30.0–36.0)
MCV: 92.9 fL (ref 80.0–100.0)
Monocytes Absolute: 0.4 10*3/uL (ref 0.1–1.0)
Monocytes Relative: 10 %
Neutro Abs: 2.2 10*3/uL (ref 1.7–7.7)
Neutrophils Relative %: 51 %
Platelets: 141 10*3/uL — ABNORMAL LOW (ref 150–400)
RBC: 4.35 MIL/uL (ref 4.22–5.81)
RDW: 12.1 % (ref 11.5–15.5)
WBC: 4.3 10*3/uL (ref 4.0–10.5)
nRBC: 0 % (ref 0.0–0.2)

## 2022-06-16 LAB — COMPREHENSIVE METABOLIC PANEL
ALT: 28 U/L (ref 0–44)
AST: 23 U/L (ref 15–41)
Albumin: 4.3 g/dL (ref 3.5–5.0)
Alkaline Phosphatase: 71 U/L (ref 38–126)
Anion gap: 8 (ref 5–15)
BUN: 22 mg/dL (ref 8–23)
CO2: 29 mmol/L (ref 22–32)
Calcium: 9.1 mg/dL (ref 8.9–10.3)
Chloride: 98 mmol/L (ref 98–111)
Creatinine, Ser: 1.36 mg/dL — ABNORMAL HIGH (ref 0.61–1.24)
GFR, Estimated: 50 mL/min — ABNORMAL LOW (ref >60)
Glucose, Bld: 144 mg/dL — ABNORMAL HIGH (ref 70–99)
Potassium: 3.4 mmol/L — ABNORMAL LOW (ref 3.5–5.1)
Sodium: 135 mmol/L (ref 135–145)
Total Bilirubin: 0.8 mg/dL (ref 0.3–1.2)
Total Protein: 8.5 g/dL — ABNORMAL HIGH (ref 6.5–8.1)

## 2022-06-16 LAB — LACTATE DEHYDROGENASE: LDH: 150 U/L (ref 98–192)

## 2022-06-17 LAB — KAPPA/LAMBDA LIGHT CHAINS
Kappa free light chain: 31.1 mg/L — ABNORMAL HIGH (ref 3.3–19.4)
Kappa, lambda light chain ratio: 1.97 — ABNORMAL HIGH (ref 0.26–1.65)
Lambda free light chains: 15.8 mg/L (ref 5.7–26.3)

## 2022-06-18 LAB — PROTEIN ELECTROPHORESIS, SERUM
A/G Ratio: 1.1 (ref 0.7–1.7)
Albumin ELP: 4.1 g/dL (ref 2.9–4.4)
Alpha-1-Globulin: 0.3 g/dL (ref 0.0–0.4)
Alpha-2-Globulin: 0.7 g/dL (ref 0.4–1.0)
Beta Globulin: 0.8 g/dL (ref 0.7–1.3)
Gamma Globulin: 2.1 g/dL — ABNORMAL HIGH (ref 0.4–1.8)
Globulin, Total: 3.8 g/dL (ref 2.2–3.9)
M-Spike, %: 1.4 g/dL — ABNORMAL HIGH
Total Protein ELP: 7.9 g/dL (ref 6.0–8.5)

## 2022-06-22 NOTE — Progress Notes (Unsigned)
[facility] [address], [place] [address]   CLINIC:  Medical Oncology/Hematology  PCP:  Sylvester Harder, FNP [address] [place] [address] [phone]   REASON FOR VISIT:  Follow-up for smoldering multiple myeloma   PRIOR THERAPY: None   CURRENT THERAPY: Surveillance  INTERVAL HISTORY:   Maurice Cooper 89 y.o. male returns for routine follow-up of smoldering myeloma.  He was last seen on 12/23/2021 by Rojelio Brenner PA-C.   At today's visit, he reports feeling well.***No recent hospitalizations, surgeries, or changes in baseline health status.   He denies any new onset bone pain or recent fractures.  *** *** He denies any B symptoms such as fever, chills, night sweats, unintentional weight loss. *** No new neurologic symptoms such as tinnitus, new-onset hearing loss, blurred vision, headache, or dizziness. *** Denies any numbness or tingling in hands or feet. *** No thromboembolic events since his last visit. *** No new masses or lymphadenopathy per his report.  He has ***% energy and ***% appetite. He endorses that he is maintaining a stable weight.   ASSESSMENT & PLAN:  1.  IgG kappa smoldering myeloma - Bone marrow biopsy on 07/19/2014 shows 10% plasma cells with normal cytogenetics and FISH panel. - PET scan on 08/14/2014 was negative for lytic lesions.  Subsequent CT scan of the chest in 2018 was also negative. - Most recent skeletal survey on 11/14/2021 did not show any lytic or lucent lesions to suggest myeloma - He does not have any new onset bone pains.  Denies any recurrent infections.*** - No B symptoms.  No new lumps or bumps.*** - Reviewed most recent labs (06/16/2022): SPEP shows a stable M spike 1.4.  Stable light chains with elevated kappa 31.1, normal lambda 15.8, and elevated ratio 1.97.  Creatinine 1.36/GFR 50, stable at baseline.  Calcium 9.1.  Hgb 14.0.  LDH normal.   - PLAN: No intervention needed at this time.  We will  repeat labs in 6 months with follow-up visit.  (Skeletal survey due annually, next in October 2024)   2.  CKD stage III: - Baseline creatinine between 1.2-1.5.   3.  Leukopenia - WBCs are chronically low, ranging from 2.8-5.5 dating back to at least 2015    PLAN SUMMARY: >> Labs in 6 months = SPEP, light chains, LDH, CMP, CBC/D >> Skeletal survey / whole body Xray in 6 months >> OFFICE visit in 6 months (1 week after labs/x-ray)    [facility] at [facility] **VISIT SUMMARY & IMPORTANT INSTRUCTIONS **   You were seen today by Rojelio Brenner PA-C for your ***.    *** ***  *** ***  LABS: Return in ***   OTHER TESTS: ***  MEDICATIONS: ***  FOLLOW-UP APPOINTMENT: ***     REVIEW OF SYSTEMS: ***  Review of Systems - Oncology   PHYSICAL EXAM:  ECOG PERFORMANCE STATUS: {CHL ONC ECOG KG:[id number]} *** There were no vitals filed for this visit. There were no vitals filed for this visit. Physical Exam  PAST MEDICAL/SURGICAL HISTORY:  Past Medical History:  Diagnosis Date   Amputation of index finger    Chronic kidney disease, stage 2 (mild)    Diabetes (HCC)    Diabetes mellitus (HCC)    Disorder of the skin and subcutaneous tissue, unspecified    Glaucoma    High cholesterol    Hypercholesteremia    Hypertension    Malignant neoplasm of external lower lip  Melanoma (HCC)    lower back   Monoclonal gammopathy    Multiple myeloma not having achieved remission (HCC)    Other pancytopenia (HCC) 02/25/2014   Personal history of other malignant neoplasm of skin    Smoldering multiple myeloma (SMM) 02/25/2014   Unspecified glaucoma    Past Surgical History:  Procedure Laterality Date   AMPUTATION FINGER / THUMB Left    forefinger   BONE MARROW ASPIRATION Left 07/19/14   BONE MARROW BIOPSY Left 07/19/14   CATARACT EXTRACTION Bilateral    CORNEAL TRANSPLANT Right    MELANOMA EXCISION Left    left flank area 1980s   TONSILLECTOMY       SOCIAL HISTORY:  Social History   Socioeconomic History   Marital status: Widowed    Spouse name: Not on file   Number of children: Not on file   Years of education: Not on file   Highest education level: Not on file  Occupational History   Not on file  Tobacco Use   Smoking status: Former    Types: Cigarettes   Smokeless tobacco: Former    Types: Associate Professor Use: Never used  Substance and Sexual Activity   Alcohol use: No   Drug use: No   Sexual activity: Not on file  Other Topics Concern   Not on file  Social History Narrative   Not on file   Social Determinants of Health   Financial Resource Strain: Not on file  Food Insecurity: Not on file  Transportation Needs: Not on file  Physical Activity: Not on file  Stress: Not on file  Social Connections: Not on file  Intimate Partner Violence: Not on file    FAMILY HISTORY:  Family History  Problem Relation Age of Onset   Cancer Mother    Stroke Father     CURRENT MEDICATIONS:  Outpatient Encounter Medications as of 06/23/2022  Medication Sig   amLODipine (NORVASC) 10 MG tablet Take 10 mg by mouth daily.    aspirin EC 325 MG tablet Take 325 mg by mouth daily.   doxazosin (CARDURA) 4 MG tablet Take 4 mg by mouth daily.    finasteride (PROSCAR) 5 MG tablet Take 5 mg by mouth daily.   glipiZIDE (GLUCOTROL XL) 5 MG 24 hr tablet Take 5 mg by mouth daily with breakfast.    JANUVIA 100 MG tablet Take 100 mg by mouth daily.   JARDIANCE 10 MG TABS tablet Take 25 mg by mouth every morning.   ketoconazole (NIZORAL) 2 % cream Apply 1 application topically 2 (two) times daily. Apply to feet   levothyroxine (SYNTHROID) 25 MCG tablet Take 25 mcg by mouth daily.   metoprolol (LOPRESSOR) 100 MG tablet Take 100 mg by mouth 2 (two) times daily.   Multiple Vitamins-Minerals (CENTRUM SILVER PO) Take 1 capsule by mouth daily.   OneTouch Delica Lancets 33G MISC Apply topically daily.   ONETOUCH ULTRA test strip  daily. for testing   potassium chloride SA (K-DUR,KLOR-CON) 20 MEQ tablet Take 20 mEq by mouth daily.   pravastatin (PRAVACHOL) 40 MG tablet Take 40 mg by mouth daily.   timolol (TIMOPTIC) 0.5 % ophthalmic solution Place 1 drop into the right eye daily.    triamcinolone (KENALOG) 0.1 % SMARTSIG:1 Application Topical 2-3 Times Daily   valsartan-hydrochlorothiazide (DIOVAN-HCT) 160-12.5 MG per tablet Take 1 tablet by mouth daily.   No facility-administered encounter medications on file as of 06/23/2022.    ALLERGIES:  No  Known Allergies  LABORATORY DATA:  I have reviewed the labs as listed.  CBC    Component Value Date/Time   WBC 4.3 06/16/2022 1029   RBC 4.35 06/16/2022 1029   HGB 14.0 06/16/2022 1029   HCT 40.4 06/16/2022 1029   PLT 141 (L) 06/16/2022 1029   MCV 92.9 06/16/2022 1029   MCH 32.2 06/16/2022 1029   MCHC 34.7 06/16/2022 1029   RDW 12.1 06/16/2022 1029   LYMPHSABS 1.4 06/16/2022 1029   MONOABS 0.4 06/16/2022 1029   EOSABS 0.3 06/16/2022 1029   BASOSABS 0.0 06/16/2022 1029      Latest Ref Rng & Units 06/16/2022   10:29 AM 11/14/2021   11:58 AM 05/08/2021   10:52 AM  CMP  Glucose 70 - 99 mg/dL 161  096  045   BUN 8 - 23 mg/dL 22  23  27    Creatinine 0.61 - 1.24 mg/dL 4.09  8.11  9.14   Sodium 135 - 145 mmol/L 135  139  138   Potassium 3.5 - 5.1 mmol/L 3.4  3.5  3.8   Chloride 98 - 111 mmol/L 98  102  100   CO2 22 - 32 mmol/L 29  29  29    Calcium 8.9 - 10.3 mg/dL 9.1  9.4  9.1   Total Protein 6.5 - 8.1 g/dL 8.5  8.3  8.4   Total Bilirubin 0.3 - 1.2 mg/dL 0.8  0.8  0.8   Alkaline Phos 38 - 126 U/L 71  69  69   AST 15 - 41 U/L 23  24  24    ALT 0 - 44 U/L 28  26  26      DIAGNOSTIC IMAGING:  I have independently reviewed the relevant imaging and discussed with the patient.   WRAP UP:  All questions were answered. The patient knows to call the clinic with any problems, questions or concerns.  Medical decision making: ***  Time spent on visit: I spent ***  minutes counseling the patient face to face. The total time spent in the appointment was *** minutes and more than 50% was on counseling.  Carnella Guadalajara, PA-C  ***

## 2022-06-23 ENCOUNTER — Ambulatory Visit: Payer: Medicare Other | Admitting: Physician Assistant

## 2022-06-23 ENCOUNTER — Other Ambulatory Visit: Payer: Self-pay

## 2022-06-23 ENCOUNTER — Inpatient Hospital Stay: Payer: Medicare Other | Admitting: Physician Assistant

## 2022-06-23 VITALS — BP 172/81 | HR 65 | Temp 97.6°F | Resp 16 | Wt 176.4 lb

## 2022-06-23 DIAGNOSIS — D472 Monoclonal gammopathy: Secondary | ICD-10-CM

## 2022-06-23 NOTE — Patient Instructions (Signed)
[facility] at [facility] Discharge Instructions  You were seen today by Rojelio Brenner PA-C for your smoldering myeloma.  Your labs are stable, which show that you are still in a "precancerous state," and that you do not have any signs of progressing to cancer (multiple myeloma) at this time.  We will check your labs in 6 months.  We will see you back for another office visit in 6 months (after labs).  LABS: Return in 6 months for repeat labs  OTHER TESTS: Whole-body Xrays in 6 months  MEDICATIONS: No changes to home medications  FOLLOW-UP APPOINTMENT: Office visit in 6 months, 1 week after labs/Xrays  ** Thank you for trusting me with your healthcare!  I strive to provide all of my patients with quality care at each visit.  If you receive a survey for this visit, I would be so grateful to you for taking the time to provide feedback.  Thank you in advance!  ~ Axzel Rockhill                   Dr. Doreatha Massed   &   Rojelio Brenner, PA-C   - - - - - - - - - - - - - - - - - -     Thank you for choosing [facility] at [facility] to provide your oncology and hematology care.  To afford each patient quality time with our provider, please arrive at least 15 minutes before your scheduled appointment time.   If you have a lab appointment with the Cancer Center please come in thru the Main Entrance and check in at the main information desk.  You need to re-schedule your appointment should you arrive 10 or more minutes late.  We strive to give you quality time with our providers, and arriving late affects you and other patients whose appointments are after yours.  Also, if you no show three or more times for appointments you may be dismissed from the clinic at the providers discretion.     Again, thank you for choosing [facility].  Our hope is that these requests will decrease the amount of time that you wait before being seen by our  physicians.       _____________________________________________________________  Should you have questions after your visit to [facility], please contact our office at [phone] and follow the prompts.  Our office hours are 8:00 a.m. and 4:30 p.m. Monday - Friday.  Please note that voicemails left after 4:00 p.m. may not be returned until the following business day.  We are closed weekends and major holidays.  You do have access to a nurse 24-7, just call the main number to the clinic [phone] and do not press any options, hold on the line and a nurse will answer the phone.    For prescription refill requests, have your pharmacy contact our office and allow 72 hours.    Due to Covid, you will need to wear a mask upon entering the hospital. If you do not have a mask, a mask will be given to you at the Main Entrance upon arrival. For doctor visits, patients may have 1 support person age 45 or older with them. For treatment visits, patients can not have anyone with them due to social distancing guidelines and our immunocompromised population.

## 2023-01-05 ENCOUNTER — Ambulatory Visit (HOSPITAL_COMMUNITY)
Admission: RE | Admit: 2023-01-05 | Discharge: 2023-01-05 | Disposition: A | Discharge status: Home / Self Care | Payer: Medicare Other | Source: Ambulatory Visit | Attending: Physician Assistant | Admitting: Physician Assistant

## 2023-01-05 ENCOUNTER — Inpatient Hospital Stay: Payer: Medicare Other | Attending: Hematology

## 2023-01-05 DIAGNOSIS — D72819 Decreased white blood cell count, unspecified: Secondary | ICD-10-CM | POA: Insufficient documentation

## 2023-01-05 DIAGNOSIS — D472 Monoclonal gammopathy: Secondary | ICD-10-CM | POA: Diagnosis present

## 2023-01-05 DIAGNOSIS — Z87891 Personal history of nicotine dependence: Secondary | ICD-10-CM | POA: Diagnosis not present

## 2023-01-05 DIAGNOSIS — N183 Chronic kidney disease, stage 3 unspecified: Secondary | ICD-10-CM | POA: Diagnosis not present

## 2023-01-05 LAB — CBC WITH DIFFERENTIAL/PLATELET
Abs Immature Granulocytes: 0.02 10*3/uL (ref 0.00–0.07)
Basophils Absolute: 0 10*3/uL (ref 0.0–0.1)
Basophils Relative: 0 %
Eosinophils Absolute: 0.3 10*3/uL (ref 0.0–0.5)
Eosinophils Relative: 5 %
HCT: 42.1 % (ref 39.0–52.0)
Hemoglobin: 14.3 g/dL (ref 13.0–17.0)
Immature Granulocytes: 0 %
Lymphocytes Relative: 27 %
Lymphs Abs: 1.4 10*3/uL (ref 0.7–4.0)
MCH: 31.6 pg (ref 26.0–34.0)
MCHC: 34 g/dL (ref 30.0–36.0)
MCV: 92.9 fL (ref 80.0–100.0)
Monocytes Absolute: 0.3 10*3/uL (ref 0.1–1.0)
Monocytes Relative: 7 %
Neutro Abs: 3 10*3/uL (ref 1.7–7.7)
Neutrophils Relative %: 61 %
Platelets: 166 10*3/uL (ref 150–400)
RBC: 4.53 MIL/uL (ref 4.22–5.81)
RDW: 13.1 % (ref 11.5–15.5)
WBC: 5 10*3/uL (ref 4.0–10.5)
nRBC: 0 % (ref 0.0–0.2)

## 2023-01-05 LAB — COMPREHENSIVE METABOLIC PANEL
ALT: 32 U/L (ref 0–44)
AST: 26 U/L (ref 15–41)
Albumin: 4.3 g/dL (ref 3.5–5.0)
Alkaline Phosphatase: 69 U/L (ref 38–126)
Anion gap: 10 (ref 5–15)
BUN: 23 mg/dL (ref 8–23)
CO2: 31 mmol/L (ref 22–32)
Calcium: 9.5 mg/dL (ref 8.9–10.3)
Chloride: 100 mmol/L (ref 98–111)
Creatinine, Ser: 1.33 mg/dL — ABNORMAL HIGH (ref 0.61–1.24)
GFR, Estimated: 51 mL/min — ABNORMAL LOW (ref >60)
Glucose, Bld: 165 mg/dL — ABNORMAL HIGH (ref 70–99)
Potassium: 3.9 mmol/L (ref 3.5–5.1)
Sodium: 141 mmol/L (ref 135–145)
Total Bilirubin: 0.6 mg/dL (ref <1.2)
Total Protein: 8.6 g/dL — ABNORMAL HIGH (ref 6.5–8.1)

## 2023-01-05 LAB — LACTATE DEHYDROGENASE: LDH: 124 U/L (ref 98–192)

## 2023-01-06 LAB — KAPPA/LAMBDA LIGHT CHAINS
Kappa free light chain: 28.5 mg/L — ABNORMAL HIGH (ref 3.3–19.4)
Kappa, lambda light chain ratio: 2.05 — ABNORMAL HIGH (ref 0.26–1.65)
Lambda free light chains: 13.9 mg/L (ref 5.7–26.3)

## 2023-01-11 LAB — PROTEIN ELECTROPHORESIS, SERUM
A/G Ratio: 1.1 (ref 0.7–1.7)
Albumin ELP: 4.1 g/dL (ref 2.9–4.4)
Alpha-1-Globulin: 0.2 g/dL (ref 0.0–0.4)
Alpha-2-Globulin: 0.7 g/dL (ref 0.4–1.0)
Beta Globulin: 0.8 g/dL (ref 0.7–1.3)
Gamma Globulin: 2.1 g/dL — ABNORMAL HIGH (ref 0.4–1.8)
Globulin, Total: 3.9 g/dL (ref 2.2–3.9)
M-Spike, %: 1.5 g/dL — ABNORMAL HIGH
Total Protein ELP: 8 g/dL (ref 6.0–8.5)

## 2023-01-11 NOTE — Progress Notes (Unsigned)
[facility] [address], [place] [address]   CLINIC:  Medical Oncology/Hematology  PCP:  Sylvester Harder, FNP [address] [place] [address] [phone]   REASON FOR VISIT:  Follow-up for smoldering multiple myeloma   PRIOR THERAPY: None   CURRENT THERAPY: Surveillance  INTERVAL HISTORY:   Maurice Cooper 89 y.o. male returns for routine follow-up of smoldering myeloma.  He was last seen on 06/23/2022 by Rojelio Brenner PA-C.   At today's visit, he reports feeling well.  No recent hospitalizations, surgeries, or changes in baseline health status.   He denies any new onset bone pain or recent fractures.   He denies any B symptoms such as fever, chills, night sweats, unintentional weight loss.   No new neurologic symptoms such as tinnitus, new-onset hearing loss, blurred vision, headache, or dizziness.  Denies any numbness or tingling in hands or feet.   No thromboembolic events since his last visit.   No new masses or lymphadenopathy per his report.  He has 75% energy and 100% appetite. He endorses that he is maintaining a stable weight.   ASSESSMENT & PLAN:  1.  IgG kappa smoldering myeloma - Bone marrow biopsy on 07/19/2014 shows 10% plasma cells with normal cytogenetics and FISH panel. - PET scan on 08/14/2014 was negative for lytic lesions.  Subsequent CT scan of the chest in 2018 was also negative. - Most recent skeletal survey on 01/05/2023 pending radiology read   - He does not have any new onset bone pains.  Denies any recurrent infections. - No B symptoms.  No new lumps or bumps. - Reviewed most recent labs (01/05/2023): SPEP shows a stable M spike 1.5.  Stable light chains with elevated kappa 28.5, normal lambda 13.9, and elevated ratio 2.05.  Creatinine 1.33/GFR 51, stable at baseline.  Calcium 9.5.  Hgb 14.3.  LDH normal.   - PLAN: No intervention needed at this time.  We will repeat labs in 6 months with follow-up visit. - Skeletal survey  due annually, next in December 2025   2.  CKD stage III: - Baseline creatinine between 1.2-1.5.   3.  Leukopenia - WBCs are chronically low, ranging from 2.8-5.5 dating back to at least 2015    PLAN SUMMARY: >> Labs in 6 months = SPEP, light chains, LDH, CMP, CBC/D >> OFFICE visit in 6 months (1 week after labs)     REVIEW OF SYSTEMS:  Review of Systems  Constitutional:  Positive for fatigue (mild). Negative for appetite change, chills, diaphoresis, fever and unexpected weight change.  HENT:   Negative for lump/mass and nosebleeds.   Eyes:  Negative for eye problems.  Respiratory:  Negative for cough, hemoptysis and shortness of breath.   Cardiovascular:  Negative for chest pain, leg swelling and palpitations.  Gastrointestinal:  Negative for abdominal pain, blood in stool, constipation, diarrhea, nausea and vomiting.  Genitourinary:  Negative for hematuria.   Skin: Negative.   Neurological:  Negative for dizziness, headaches and light-headedness.  Hematological:  Does not bruise/bleed easily.     PHYSICAL EXAM:  ECOG PERFORMANCE STATUS: 0 - Asymptomatic  Vitals:   01/12/23 0935  BP: (!) 158/73  Pulse: 62  Resp: 16  Temp: 97.6 F (36.4 C)  SpO2: 98%   Filed Weights   01/12/23 0935  Weight: 176 lb 2.4 oz (79.9 kg)   Physical Exam Constitutional:      Appearance: Normal appearance. He is normal weight.  Cardiovascular:  Heart sounds: Normal heart sounds.  Pulmonary:     Breath sounds: Normal breath sounds.  Neurological:     General: No focal deficit present.     Mental Status: Mental status is at baseline.  Psychiatric:        Behavior: Behavior normal. Behavior is cooperative.    PAST MEDICAL/SURGICAL HISTORY:  Past Medical History:  Diagnosis Date   Amputation of index finger    Chronic kidney disease, stage 2 (mild)    Diabetes (HCC)    Diabetes mellitus (HCC)    Disorder of the skin and subcutaneous tissue, unspecified    Glaucoma    High  cholesterol    Hypercholesteremia    Hypertension    Malignant neoplasm of external lower lip    Melanoma (HCC)    lower back   Monoclonal gammopathy    Multiple myeloma not having achieved remission (HCC)    Other pancytopenia (HCC) 02/25/2014   Personal history of other malignant neoplasm of skin    Smoldering multiple myeloma (SMM) 02/25/2014   Unspecified glaucoma    Past Surgical History:  Procedure Laterality Date   AMPUTATION FINGER / THUMB Left    forefinger   BONE MARROW ASPIRATION Left 07/19/14   BONE MARROW BIOPSY Left 07/19/14   CATARACT EXTRACTION Bilateral    CORNEAL TRANSPLANT Right    MELANOMA EXCISION Left    left flank area 1980s   TONSILLECTOMY      SOCIAL HISTORY:  Social History   Socioeconomic History   Marital status: Widowed    Spouse name: Not on file   Number of children: Not on file   Years of education: Not on file   Highest education level: Not on file  Occupational History   Not on file  Tobacco Use   Smoking status: Former    Types: Cigarettes   Smokeless tobacco: Former    Types: Engineer, drilling   Vaping status: Never Used  Substance and Sexual Activity   Alcohol use: No   Drug use: No   Sexual activity: Not on file  Other Topics Concern   Not on file  Social History Narrative   Not on file   Social Determinants of Health   Financial Resource Strain: Not on file  Food Insecurity: Not on file  Transportation Needs: Not on file  Physical Activity: Not on file  Stress: Not on file  Social Connections: Not on file  Intimate Partner Violence: Not on file    FAMILY HISTORY:  Family History  Problem Relation Age of Onset   Cancer Mother    Stroke Father     CURRENT MEDICATIONS:  Outpatient Encounter Medications as of 01/12/2023  Medication Sig   amLODipine (NORVASC) 10 MG tablet Take 10 mg by mouth daily.    aspirin EC 325 MG tablet Take 325 mg by mouth daily.   doxazosin (CARDURA) 4 MG tablet Take 4 mg by mouth daily.     finasteride (PROSCAR) 5 MG tablet Take 5 mg by mouth daily.   glipiZIDE (GLUCOTROL XL) 5 MG 24 hr tablet Take 5 mg by mouth daily with breakfast.    JANUVIA 100 MG tablet Take 100 mg by mouth daily.   JARDIANCE 10 MG TABS tablet Take 25 mg by mouth every morning.   levothyroxine (SYNTHROID) 25 MCG tablet Take 25 mcg by mouth daily.   metoprolol (LOPRESSOR) 100 MG tablet Take 100 mg by mouth 2 (two) times daily.   Multiple Vitamins-Minerals (CENTRUM SILVER  PO) Take 1 capsule by mouth daily.   OneTouch Delica Lancets 33G MISC Apply topically daily.   ONETOUCH ULTRA test strip daily. for testing   potassium chloride SA (K-DUR,KLOR-CON) 20 MEQ tablet Take 20 mEq by mouth daily.   pravastatin (PRAVACHOL) 40 MG tablet Take 40 mg by mouth daily.   timolol (TIMOPTIC) 0.5 % ophthalmic solution Place 1 drop into the right eye daily.    valsartan-hydrochlorothiazide (DIOVAN-HCT) 160-12.5 MG per tablet Take 1 tablet by mouth daily.   [DISCONTINUED] ketoconazole (NIZORAL) 2 % cream Apply 1 application topically 2 (two) times daily. Apply to feet   [DISCONTINUED] triamcinolone (KENALOG) 0.1 % SMARTSIG:1 Application Topical 2-3 Times Daily   No facility-administered encounter medications on file as of 01/12/2023.    ALLERGIES:  No Known Allergies  LABORATORY DATA:  I have reviewed the labs as listed.  CBC    Component Value Date/Time   WBC 5.0 01/05/2023 1132   RBC 4.53 01/05/2023 1132   HGB 14.3 01/05/2023 1132   HCT 42.1 01/05/2023 1132   PLT 166 01/05/2023 1132   MCV 92.9 01/05/2023 1132   MCH 31.6 01/05/2023 1132   MCHC 34.0 01/05/2023 1132   RDW 13.1 01/05/2023 1132   LYMPHSABS 1.4 01/05/2023 1132   MONOABS 0.3 01/05/2023 1132   EOSABS 0.3 01/05/2023 1132   BASOSABS 0.0 01/05/2023 1132      Latest Ref Rng & Units 01/05/2023   11:32 AM 06/16/2022   10:29 AM 11/14/2021   11:58 AM  CMP  Glucose 70 - 99 mg/dL 621  308  657   BUN 8 - 23 mg/dL 23  22  23    Creatinine 0.61 - 1.24  mg/dL 8.46  9.62  9.52   Sodium 135 - 145 mmol/L 141  135  139   Potassium 3.5 - 5.1 mmol/L 3.9  3.4  3.5   Chloride 98 - 111 mmol/L 100  98  102   CO2 22 - 32 mmol/L 31  29  29    Calcium 8.9 - 10.3 mg/dL 9.5  9.1  9.4   Total Protein 6.5 - 8.1 g/dL 8.6  8.5  8.3   Total Bilirubin <1.2 mg/dL 0.6  0.8  0.8   Alkaline Phos 38 - 126 U/L 69  71  69   AST 15 - 41 U/L 26  23  24    ALT 0 - 44 U/L 32  28  26     DIAGNOSTIC IMAGING:  I have independently reviewed the relevant imaging and discussed with the patient.   WRAP UP:  All questions were answered. The patient knows to call the clinic with any problems, questions or concerns.  Medical decision making: Moderate  Time spent on visit: I spent 20 minutes counseling the patient face to face. The total time spent in the appointment was 30 minutes and more than 50% was on counseling.  Carnella Guadalajara, PA-C  01/12/23 10:15 AM

## 2023-01-12 ENCOUNTER — Inpatient Hospital Stay: Payer: Medicare Other | Admitting: Physician Assistant

## 2023-01-12 VITALS — BP 158/73 | HR 62 | Temp 97.6°F | Resp 16 | Wt 176.1 lb

## 2023-01-12 DIAGNOSIS — D472 Monoclonal gammopathy: Secondary | ICD-10-CM | POA: Diagnosis not present

## 2023-01-12 NOTE — Patient Instructions (Signed)
Maurice Cooper  You were seen today by Rojelio Brenner PA-C for your smoldering myeloma.  Your labs are stable, which show that you are still in a "precancerous state," and that you do not have any signs of progressing to cancer (multiple myeloma) at this time.  We will check your labs in 6 months.  We will see you back for another office visit in 6 months (after labs).  LABS: Return in 6 months for repeat labs  MEDICATIONS: No changes to home medications  FOLLOW-UP APPOINTMENT: Office visit in 6 months, 1 week after labs   ** Thank you for trusting me with your healthcare!  Maurice strive to provide all of my patients with quality care at each visit.  If you receive a survey for this visit, Maurice Cooper                   Dr. Doreatha Massed   &   Rojelio Brenner, PA-C   - - - - - - - - - - - - - - - - - -     Thank you for choosing [facility] at [facility] to provide your oncology and hematology care.  To afford each patient quality time with our provider, please arrive at least 15 minutes before your scheduled appointment time.   If you have a lab appointment with the Cancer Center please come in thru the Main Entrance and check in at the main information desk.  You need to re-schedule your appointment should you arrive 10 or more minutes late.  We strive to give you quality time with our providers, and arriving late affects you and other patients whose appointments are after yours.  Also, if you no show three or more times for appointments you may be dismissed from the clinic at the providers discretion.     Again, thank you for choosing [facility].  Our hope is that these requests will decrease the amount of time that you wait before being seen by our physicians.        _____________________________________________________________  Should you have questions after your visit to [facility], please contact our office at [phone] and follow the prompts.  Our office hours are 8:00 a.m. and 4:30 p.m. Monday - Friday.  Please note that voicemails left after 4:00 p.m. may not be returned until the following business day.  We are closed weekends and major holidays.  You do have access to a nurse 24-7, just call the main number to the clinic [phone] and do not press any options, hold on the line and a nurse will answer the phone.    For prescription refill requests, have your pharmacy contact our office and allow 72 hours.    Due to Covid, you will need to wear a mask upon entering the hospital. If you do not have a mask, a mask will be given to you at the Main Entrance upon arrival. For doctor visits, patients may have 1 support person age 36 or older with them. For treatment visits, patients can not have anyone with them due to social distancing guidelines and our immunocompromised population.

## 2023-07-13 ENCOUNTER — Inpatient Hospital Stay: Payer: Medicare Other | Attending: Hematology

## 2023-07-13 DIAGNOSIS — Z87891 Personal history of nicotine dependence: Secondary | ICD-10-CM | POA: Diagnosis not present

## 2023-07-13 DIAGNOSIS — N183 Chronic kidney disease, stage 3 unspecified: Secondary | ICD-10-CM | POA: Diagnosis not present

## 2023-07-13 DIAGNOSIS — D472 Monoclonal gammopathy: Secondary | ICD-10-CM | POA: Insufficient documentation

## 2023-07-13 DIAGNOSIS — D72819 Decreased white blood cell count, unspecified: Secondary | ICD-10-CM | POA: Insufficient documentation

## 2023-07-13 LAB — CBC WITH DIFFERENTIAL/PLATELET
Abs Immature Granulocytes: 0.02 10*3/uL (ref 0.00–0.07)
Basophils Absolute: 0 10*3/uL (ref 0.0–0.1)
Basophils Relative: 0 %
Eosinophils Absolute: 0.3 10*3/uL (ref 0.0–0.5)
Eosinophils Relative: 6 %
HCT: 41.4 % (ref 39.0–52.0)
Hemoglobin: 14.4 g/dL (ref 13.0–17.0)
Immature Granulocytes: 0 %
Lymphocytes Relative: 33 %
Lymphs Abs: 1.5 10*3/uL (ref 0.7–4.0)
MCH: 31.9 pg (ref 26.0–34.0)
MCHC: 34.8 g/dL (ref 30.0–36.0)
MCV: 91.8 fL (ref 80.0–100.0)
Monocytes Absolute: 0.4 10*3/uL (ref 0.1–1.0)
Monocytes Relative: 8 %
Neutro Abs: 2.4 10*3/uL (ref 1.7–7.7)
Neutrophils Relative %: 53 %
Platelets: 156 10*3/uL (ref 150–400)
RBC: 4.51 MIL/uL (ref 4.22–5.81)
RDW: 13.1 % (ref 11.5–15.5)
WBC: 4.6 10*3/uL (ref 4.0–10.5)
nRBC: 0 % (ref 0.0–0.2)

## 2023-07-13 LAB — COMPREHENSIVE METABOLIC PANEL WITH GFR
ALT: 29 U/L (ref 0–44)
AST: 23 U/L (ref 15–41)
Albumin: 4.1 g/dL (ref 3.5–5.0)
Alkaline Phosphatase: 73 U/L (ref 38–126)
Anion gap: 10 (ref 5–15)
BUN: 25 mg/dL — ABNORMAL HIGH (ref 8–23)
CO2: 26 mmol/L (ref 22–32)
Calcium: 9.1 mg/dL (ref 8.9–10.3)
Chloride: 100 mmol/L (ref 98–111)
Creatinine, Ser: 1.2 mg/dL (ref 0.61–1.24)
GFR, Estimated: 57 mL/min — ABNORMAL LOW (ref >60)
Glucose, Bld: 191 mg/dL — ABNORMAL HIGH (ref 70–99)
Potassium: 3.5 mmol/L (ref 3.5–5.1)
Sodium: 136 mmol/L (ref 135–145)
Total Bilirubin: 0.9 mg/dL (ref 0.0–1.2)
Total Protein: 8.3 g/dL — ABNORMAL HIGH (ref 6.5–8.1)

## 2023-07-13 LAB — LACTATE DEHYDROGENASE: LDH: 133 U/L (ref 98–192)

## 2023-07-14 LAB — PROTEIN ELECTROPHORESIS, SERUM
A/G Ratio: 1.1 (ref 0.7–1.7)
Albumin ELP: 4 g/dL (ref 2.9–4.4)
Alpha-1-Globulin: 0.2 g/dL (ref 0.0–0.4)
Alpha-2-Globulin: 0.7 g/dL (ref 0.4–1.0)
Beta Globulin: 0.8 g/dL (ref 0.7–1.3)
Gamma Globulin: 2 g/dL — ABNORMAL HIGH (ref 0.4–1.8)
Globulin, Total: 3.8 g/dL (ref 2.2–3.9)
M-Spike, %: 1.5 g/dL — ABNORMAL HIGH
Total Protein ELP: 7.8 g/dL (ref 6.0–8.5)

## 2023-07-14 LAB — KAPPA/LAMBDA LIGHT CHAINS
Kappa free light chain: 21.3 mg/L — ABNORMAL HIGH (ref 3.3–19.4)
Kappa, lambda light chain ratio: 1.85 — ABNORMAL HIGH (ref 0.26–1.65)
Lambda free light chains: 11.5 mg/L (ref 5.7–26.3)

## 2023-07-19 NOTE — Progress Notes (Signed)
 [facility] [address], [place] [address]   CLINIC:  Medical Oncology/Hematology  PCP:  Roland Cleveland, FNP [address] [place] [address] [phone]   REASON FOR VISIT:  Follow-up for smoldering multiple myeloma   PRIOR THERAPY: None   CURRENT THERAPY: Surveillance  INTERVAL HISTORY:   [name] 89 y.o. male returns for routine follow-up of smoldering myeloma.  He was last seen on 01/12/2023 by Sheril Dines PA-C.   At today's visit, he reports feeling well. *** No recent hospitalizations, surgeries, or changes in baseline health status.   He denies any new onset bone pain or recent fractures.*** ***He denies any B symptoms such as fever, chills, night sweats, unintentional weight loss. ***No new neurologic symptoms such as tinnitus, new-onset hearing loss, blurred vision, headache, or dizziness. ***Denies any numbness or tingling in hands or feet. ***No thromboembolic events since his last visit. ***No new masses or lymphadenopathy per his report. He has 75***% energy and 100***% appetite. ***He endorses that he is maintaining a stable weight.  ASSESSMENT & PLAN:  1.  IgG kappa smoldering myeloma - Bone marrow biopsy on 07/19/2014 shows 10% plasma cells with normal cytogenetics and FISH panel. - PET scan on 08/14/2014 was negative for lytic lesions.  Subsequent CT scan of the chest in 2018 was also negative. - Most recent skeletal survey on 01/05/2023 negative for any focal lytic or blastic bone lesion - He does not have any new onset bone pains.  Denies any recurrent infections.*** - No B symptoms.  No new lumps or bumps.*** - Reviewed most recent labs (07/13/2023):  M spike stable at 1.5 Stable elevation in FLC ratio 1.85 (kappa 21.3/lambda 11.5) Hemoglobin 14.4.  Creatinine 1.20, calcium 9.1.  LDH normal.  - PLAN: No intervention needed at this time.  We will repeat labs in 6 months with follow-up visit. - Skeletal survey due  annually, next in December 2025 - Recheck 24-hour urine/UPEP prior to next appointment (last checked in 2017)  2.  CKD stage III: - Baseline creatinine between 1.2-1.5.   3.  Leukopenia - WBCs are chronically low, ranging from 2.8-5.5 dating back to at least 2015    PLAN SUMMARY: *** Review *** >> 24-hour urine/UPEP >> Labs in 6 months = SPEP, light chains, LDH, CMP, CBC/D >> OFFICE visit in 6 months (1 week after labs)     REVIEW OF SYSTEMS:***  Review of Systems  Constitutional:  Positive for fatigue (mild). Negative for appetite change, chills, diaphoresis, fever and unexpected weight change.  HENT:   Negative for lump/mass and nosebleeds.   Eyes:  Negative for eye problems.  Respiratory:  Negative for cough, hemoptysis and shortness of breath.   Cardiovascular:  Negative for chest pain, leg swelling and palpitations.  Gastrointestinal:  Negative for abdominal pain, blood in stool, constipation, diarrhea, nausea and vomiting.  Genitourinary:  Negative for hematuria.   Skin: Negative.   Neurological:  Negative for dizziness, headaches and light-headedness.  Hematological:  Does not bruise/bleed easily.     PHYSICAL EXAM:***  ECOG PERFORMANCE STATUS: 0 - Asymptomatic  There were no vitals filed for this visit.  There were no vitals filed for this visit.  Physical Exam Constitutional:      Appearance: Normal appearance. He is normal weight.   Cardiovascular:     Heart sounds: Normal heart sounds.  Pulmonary:     Breath sounds: Normal breath sounds.   Neurological:     General: No focal  deficit present.     Mental Status: Mental status is at baseline.   Psychiatric:        Behavior: Behavior normal. Behavior is cooperative.    PAST MEDICAL/SURGICAL HISTORY:  Past Medical History:  Diagnosis Date   Amputation of index finger    Chronic kidney disease, stage 2 (mild)    Diabetes (HCC)    Diabetes mellitus (HCC)    Disorder of the skin and subcutaneous tissue,  unspecified    Glaucoma    High cholesterol    Hypercholesteremia    Hypertension    Malignant neoplasm of external lower lip    Melanoma (HCC)    lower back   Monoclonal gammopathy    Multiple myeloma not having achieved remission (HCC)    Other pancytopenia (HCC) 02/25/2014   Personal history of other malignant neoplasm of skin    Smoldering multiple myeloma (SMM) 02/25/2014   Unspecified glaucoma    Past Surgical History:  Procedure Laterality Date   AMPUTATION FINGER / THUMB Left    forefinger   BONE MARROW ASPIRATION Left 07/19/14   BONE MARROW BIOPSY Left 07/19/14   CATARACT EXTRACTION Bilateral    CORNEAL TRANSPLANT Right    MELANOMA EXCISION Left    left flank area 1980s   TONSILLECTOMY      SOCIAL HISTORY:  Social History   Socioeconomic History   Marital status: Widowed    Spouse name: Not on file   Number of children: Not on file   Years of education: Not on file   Highest education level: Not on file  Occupational History   Not on file  Tobacco Use   Smoking status: Former    Types: Cigarettes   Smokeless tobacco: Former    Types: Engineer, drilling   Vaping status: Never Used  Substance and Sexual Activity   Alcohol use: No   Drug use: No   Sexual activity: Not on file  Other Topics Concern   Not on file  Social History Narrative   Not on file   Social Drivers of Health   Financial Resource Strain: Not on file  Food Insecurity: Not on file  Transportation Needs: Not on file  Physical Activity: Not on file  Stress: Not on file  Social Connections: Not on file  Intimate Partner Violence: Not on file    FAMILY HISTORY:  Family History  Problem Relation Age of Onset   Cancer Mother    Stroke Father     CURRENT MEDICATIONS:  Outpatient Encounter Medications as of 07/20/2023  Medication Sig   amLODipine (NORVASC) 10 MG tablet Take 10 mg by mouth daily.    aspirin EC 325 MG tablet Take 325 mg by mouth daily.   doxazosin (CARDURA) 4 MG  tablet Take 4 mg by mouth daily.    finasteride (PROSCAR) 5 MG tablet Take 5 mg by mouth daily.   glipiZIDE (GLUCOTROL XL) 5 MG 24 hr tablet Take 5 mg by mouth daily with breakfast.    JANUVIA 100 MG tablet Take 100 mg by mouth daily.   JARDIANCE 10 MG TABS tablet Take 25 mg by mouth every morning.   levothyroxine (SYNTHROID) 25 MCG tablet Take 25 mcg by mouth daily.   metoprolol (LOPRESSOR) 100 MG tablet Take 100 mg by mouth 2 (two) times daily.   Multiple Vitamins-Minerals (CENTRUM SILVER PO) Take 1 capsule by mouth daily.   OneTouch Delica Lancets 33G MISC Apply topically daily.   ONETOUCH ULTRA test strip daily.  for testing   potassium chloride  SA (K-DUR,KLOR-CON ) 20 MEQ tablet Take 20 mEq by mouth daily.   pravastatin (PRAVACHOL) 40 MG tablet Take 40 mg by mouth daily.   timolol (TIMOPTIC) 0.5 % ophthalmic solution Place 1 drop into the right eye daily.    valsartan-hydrochlorothiazide (DIOVAN-HCT) 160-12.5 MG per tablet Take 1 tablet by mouth daily.   No facility-administered encounter medications on file as of 07/20/2023.    ALLERGIES:  No Known Allergies  LABORATORY DATA:  I have reviewed the labs as listed.  CBC    Component Value Date/Time   WBC 4.6 07/13/2023 1102   RBC 4.51 07/13/2023 1102   HGB 14.4 07/13/2023 1102   HCT 41.4 07/13/2023 1102   PLT 156 07/13/2023 1102   MCV 91.8 07/13/2023 1102   MCH 31.9 07/13/2023 1102   MCHC 34.8 07/13/2023 1102   RDW 13.1 07/13/2023 1102   LYMPHSABS 1.5 07/13/2023 1102   MONOABS 0.4 07/13/2023 1102   EOSABS 0.3 07/13/2023 1102   BASOSABS 0.0 07/13/2023 1102      Latest Ref Rng & Units 07/13/2023   11:02 AM 01/05/2023   11:32 AM 06/16/2022   10:29 AM  CMP  Glucose 70 - 99 mg/dL 161  096  045   BUN 8 - 23 mg/dL 25  23  22    Creatinine 0.61 - 1.24 mg/dL 4.09  8.11  9.14   Sodium 135 - 145 mmol/L 136  141  135   Potassium 3.5 - 5.1 mmol/L 3.5  3.9  3.4   Chloride 98 - 111 mmol/L 100  100  98   CO2 22 - 32 mmol/L 26  31  29     Calcium 8.9 - 10.3 mg/dL 9.1  9.5  9.1   Total Protein 6.5 - 8.1 g/dL 8.3  8.6  8.5   Total Bilirubin 0.0 - 1.2 mg/dL 0.9  0.6  0.8   Alkaline Phos 38 - 126 U/L 73  69  71   AST 15 - 41 U/L 23  26  23    ALT 0 - 44 U/L 29  32  28     DIAGNOSTIC IMAGING:  I have independently reviewed the relevant imaging and discussed with the patient.   WRAP UP:  All questions were answered. The patient knows to call the clinic with any problems, questions or concerns.  Medical decision making: Moderate  Time spent on visit: I spent 20 minutes counseling the patient face to face. The total time spent in the appointment was 30 minutes and more than 50% was on counseling.  Sonnie Dusky, PA-C  ***

## 2023-07-20 ENCOUNTER — Inpatient Hospital Stay: Payer: Medicare Other | Admitting: Physician Assistant

## 2023-07-20 DIAGNOSIS — D472 Monoclonal gammopathy: Secondary | ICD-10-CM

## 2023-07-20 NOTE — Patient Instructions (Addendum)
 [facility] at [facility] Discharge Instructions  PLAN SUMMARY:  >> Check 24-hour urine >> Whole-body X-rays in 6 months >> Labs in 6 months >> OFFICE visit in 6 months (1 week after labs and X-rays)    You were seen today by Sheril Dines PA-C for your smoldering myeloma.  Your labs are stable, which show that you are still in a pre-cancerous state, and that you do not have any signs of progressing to cancer (multiple myeloma) at this time.   We will repeat labs and see you for follow-up in  6 months.  We will also check a 24-hour urine study.  Kit has been provided for you. FIRST MORNING: Discard first urine of the morning into the toilet. Collect the rest of your urine in the orange jug for the next 24 hours. SECOND MORNING: Collect your first urine of the morning in the jug.  This ends your 24-hour urine collection. **Store the urine jug in the refrigerator but is not being used. Return urine jug to fourth floor front desk as soon as it is completed.    ** Thank you for trusting me with your healthcare!  I strive to provide all of my patients with quality care at each visit.  If you receive a survey for this visit, I would be so grateful to you for taking the time to provide feedback.  Thank you in advance!  ~ Lorian Yaun                   Dr. Paulett Boros   &   Sheril Dines, PA-C   - - - - - - - - - - - - - - - - - -     Thank you for choosing [facility] at [facility] to provide your oncology and hematology care.  To afford each patient quality time with our provider, please arrive at least 15 minutes before your scheduled appointment time.   If you have a lab appointment with the Cancer Center please come in thru the Main Entrance and check in at the main information desk.  You need to re-schedule your appointment should you arrive 10 or more minutes late.  We strive to give you quality time with our providers, and  arriving late affects you and other patients whose appointments are after yours.  Also, if you no show three or more times for appointments you may be dismissed from the clinic at the providers discretion.     Again, thank you for choosing [facility].  Our hope is that these requests will decrease the amount of time that you wait before being seen by our physicians.       _____________________________________________________________  Should you have questions after your visit to [facility], please contact our office at [phone] and follow the prompts.  Our office hours are 8:00 a.m. and 4:30 p.m. Monday - Friday.  Please note that voicemails left after 4:00 p.m. may not be returned until the following business day.  We are closed weekends and major holidays.  You do have access to a nurse 24-7, just call the main number to the clinic [phone] and do not press any options, hold on the line and a nurse will answer the phone.    For prescription refill requests, have your pharmacy contact our office and allow 72 hours.    Due to Covid, you will need to wear a  mask upon entering the hospital. If you do not have a mask, a mask will be given to you at the Main Entrance upon arrival. For doctor visits, patients may have 1 support person age 65 or older with them. For treatment visits, patients can not have anyone with them due to social distancing guidelines and our immunocompromised population.

## 2023-07-27 LAB — UPEP/UIFE/LIGHT CHAINS/TP, 24-HR UR
% BETA, Urine: 22.9 %
ALPHA 1 URINE: 5.1 %
Albumin, U: 35.4 %
Alpha 2, Urine: 15.6 %
Free Kappa Lt Chains,Ur: 65.06 mg/L (ref 1.17–86.46)
Free Kappa/Lambda Ratio: 7.6 (ref 1.83–14.26)
Free Lambda Lt Chains,Ur: 8.56 mg/L (ref 0.27–15.21)
GAMMA GLOBULIN URINE: 21.1 %
M-SPIKE %, Urine: 8.3 % — ABNORMAL HIGH
M-Spike, Mg/24 Hr: 14 mg/(24.h) — ABNORMAL HIGH
Total Protein, Urine-Ur/day: 164 mg/(24.h) — ABNORMAL HIGH (ref 30–150)
Total Protein, Urine: 10.9 mg/dL
Total Volume: 1500

## 2024-01-24 ENCOUNTER — Ambulatory Visit (HOSPITAL_COMMUNITY)
Admission: RE | Admit: 2024-01-24 | Discharge: 2024-01-24 | Disposition: A | Discharge status: Home / Self Care | Source: Ambulatory Visit | Attending: Physician Assistant | Admitting: Physician Assistant

## 2024-01-24 ENCOUNTER — Inpatient Hospital Stay: Attending: Hematology

## 2024-01-24 DIAGNOSIS — D472 Monoclonal gammopathy: Secondary | ICD-10-CM | POA: Insufficient documentation

## 2024-01-24 DIAGNOSIS — I129 Hypertensive chronic kidney disease with stage 1 through stage 4 chronic kidney disease, or unspecified chronic kidney disease: Secondary | ICD-10-CM | POA: Insufficient documentation

## 2024-01-24 DIAGNOSIS — Z79899 Other long term (current) drug therapy: Secondary | ICD-10-CM | POA: Diagnosis not present

## 2024-01-24 DIAGNOSIS — R803 Bence Jones proteinuria: Secondary | ICD-10-CM | POA: Insufficient documentation

## 2024-01-24 DIAGNOSIS — N1831 Chronic kidney disease, stage 3a: Secondary | ICD-10-CM | POA: Diagnosis not present

## 2024-01-24 DIAGNOSIS — Z87891 Personal history of nicotine dependence: Secondary | ICD-10-CM | POA: Insufficient documentation

## 2024-01-24 DIAGNOSIS — D72819 Decreased white blood cell count, unspecified: Secondary | ICD-10-CM | POA: Insufficient documentation

## 2024-01-24 LAB — CBC WITH DIFFERENTIAL/PLATELET
Abs Immature Granulocytes: 0.01 K/uL (ref 0.00–0.07)
Basophils Absolute: 0 K/uL (ref 0.0–0.1)
Basophils Relative: 1 %
Eosinophils Absolute: 0.2 K/uL (ref 0.0–0.5)
Eosinophils Relative: 5 %
HCT: 40.8 % (ref 39.0–52.0)
Hemoglobin: 14.1 g/dL (ref 13.0–17.0)
Immature Granulocytes: 0 %
Lymphocytes Relative: 32 %
Lymphs Abs: 1.3 K/uL (ref 0.7–4.0)
MCH: 31.6 pg (ref 26.0–34.0)
MCHC: 34.6 g/dL (ref 30.0–36.0)
MCV: 91.5 fL (ref 80.0–100.0)
Monocytes Absolute: 0.4 K/uL (ref 0.1–1.0)
Monocytes Relative: 10 %
Neutro Abs: 2 K/uL (ref 1.7–7.7)
Neutrophils Relative %: 52 %
Platelets: 163 K/uL (ref 150–400)
RBC: 4.46 MIL/uL (ref 4.22–5.81)
RDW: 13.2 % (ref 11.5–15.5)
WBC: 3.9 K/uL — ABNORMAL LOW (ref 4.0–10.5)
nRBC: 0 % (ref 0.0–0.2)

## 2024-01-24 LAB — COMPREHENSIVE METABOLIC PANEL WITH GFR
ALT: 31 U/L (ref 0–44)
AST: 28 U/L (ref 15–41)
Albumin: 4.6 g/dL (ref 3.5–5.0)
Alkaline Phosphatase: 87 U/L (ref 38–126)
Anion gap: 14 (ref 5–15)
BUN: 23 mg/dL (ref 8–23)
CO2: 27 mmol/L (ref 22–32)
Calcium: 9.5 mg/dL (ref 8.9–10.3)
Chloride: 101 mmol/L (ref 98–111)
Creatinine, Ser: 1.28 mg/dL — ABNORMAL HIGH (ref 0.61–1.24)
GFR, Estimated: 53 mL/min — ABNORMAL LOW
Glucose, Bld: 144 mg/dL — ABNORMAL HIGH (ref 70–99)
Potassium: 3.5 mmol/L (ref 3.5–5.1)
Sodium: 141 mmol/L (ref 135–145)
Total Bilirubin: 0.5 mg/dL (ref 0.0–1.2)
Total Protein: 8.5 g/dL — ABNORMAL HIGH (ref 6.5–8.1)

## 2024-01-24 LAB — LACTATE DEHYDROGENASE: LDH: 161 U/L (ref 105–235)

## 2024-01-25 LAB — KAPPA/LAMBDA LIGHT CHAINS
Kappa free light chain: 22 mg/L — ABNORMAL HIGH (ref 3.3–19.4)
Kappa, lambda light chain ratio: 1.68 — ABNORMAL HIGH (ref 0.26–1.65)
Lambda free light chains: 13.1 mg/L (ref 5.7–26.3)

## 2024-01-28 LAB — PROTEIN ELECTROPHORESIS, SERUM
A/G Ratio: 1 (ref 0.7–1.7)
Albumin ELP: 4 g/dL (ref 2.9–4.4)
Alpha-1-Globulin: 0.3 g/dL (ref 0.0–0.4)
Alpha-2-Globulin: 0.8 g/dL (ref 0.4–1.0)
Beta Globulin: 0.9 g/dL (ref 0.7–1.3)
Gamma Globulin: 2 g/dL — ABNORMAL HIGH (ref 0.4–1.8)
Globulin, Total: 4 g/dL — ABNORMAL HIGH (ref 2.2–3.9)
M-Spike, %: 1.4 g/dL — ABNORMAL HIGH
Total Protein ELP: 8 g/dL (ref 6.0–8.5)

## 2024-01-29 NOTE — Progress Notes (Unsigned)
 "  [facility] [address], [place] [address]   CLINIC:  Medical Oncology/Hematology  PCP:  Fraser Darris LABOR, FNP [address] [facility] [address] [place] [facility] [place] [address] [phone]   REASON FOR VISIT:  Follow-up for smoldering multiple myeloma   PRIOR THERAPY: None   CURRENT THERAPY: Surveillance  INTERVAL HISTORY:   Maurice Cooper 89 y.o. male returns for routine follow-up of smoldering myeloma.   He was last seen on 07/20/2023 by [name] PA-C.   At today's visit, he reports feeling fairly well.   No recent hospitalizations, surgeries, or changes in baseline health status. He has 75% energy and 100% appetite. He endorses that he is maintaining a stable weight.  He denies any new onset bone pain or recent fractures. He denies any B symptoms such as fever, chills, night sweats, unintentional weight loss. No new neurologic symptoms such as tinnitus, new-onset hearing loss, blurred vision, headache, or dizziness. Denies any numbness or tingling in hands or feet. No new masses or lymphadenopathy per his report.  ASSESSMENT & PLAN:  1.  IgG kappa smoldering myeloma - Bone marrow biopsy on 07/19/2014 shows 10% plasma cells with normal cytogenetics and FISH panel. - PET scan on 08/14/2014 was negative for lytic lesions.  Subsequent CT scan of the chest in 2018 was also negative. - Most recent skeletal survey on 01/05/2023 negative for any focal lytic or blastic bone lesion - Updated skeletal survey (01/24/2024) PENDING radiology read - 24-hour urine/UPEP (07/20/2023): Bence-Jones protein positive, IgG kappa.  Urine M-spike 14 mg / 24 hours. - He does not have any new onset bone pains.  Denies any recurrent infections. - No B symptoms.  No new lumps or bumps. - Reviewed most recent labs (01/24/2024):  M spike stable at 1.4 Stable elevation in FLC ratio 1.68 (kappa 22.0/lambda 13.1) Hemoglobin 14.1.  Creatinine 1.28,  calcium 9.5.  LDH normal.  - PLAN: No intervention needed at this time.  We will repeat labs in 6 months with follow-up visit. - Skeletal survey due annually, next in December 2026 - Recheck 24-hour urine/UPEP in June/July 2026 (will give kit to patient at next office visit)  2.  CKD stage IIIa: - Baseline creatinine between 1.2-1.5.   3.  Leukopenia - WBCs are chronically low, ranging from 2.8-5.5 dating back to at least 2015  4.  Elevated blood pressure - Initial blood pressure was 202/93 - Repeat blood pressure 188/78 - Patient is asymptomatic; no headache, dizziness, blurry vision, strokelike symptoms, chest pain, or dyspnea  - Patient reports that home blood pressure is usually SBP <160/90 - Patient takes home blood pressure medications (metoprolol, valsartan/HCTZ, amlodipine), denies any missed doses  - Patient reports white coat hypertension, with frequently elevated BP at medical appointments - PLAN: Patient instructed to repeat blood pressure at home after resting for half an hour, and to contact PCP if blood pressure is greater than 160/90.  Instructed on alarm symptoms that would prompt 911 call and immediate medical attention.    PLAN SUMMARY:  >> Labs in 6 months = SPEP, light chains, LDH, CMP, CBC/D >> OFFICE visit in 6 months (1 week after labs)     REVIEW OF SYSTEMS:  Review of Systems  Constitutional:  Positive for fatigue (mild). Negative for appetite change, chills, diaphoresis, fever and unexpected weight change.  HENT:   Negative for lump/mass and nosebleeds.   Eyes:  Negative for eye problems.  Respiratory:  Negative for cough, hemoptysis and shortness of breath.   Cardiovascular:  Negative for chest pain, leg swelling and palpitations.  Gastrointestinal:  Negative for abdominal pain, blood in stool, constipation, diarrhea, nausea and vomiting.  Genitourinary:  Negative for hematuria.   Skin: Negative.   Neurological:  Negative for dizziness, headaches and  light-headedness.  Hematological:  Does not bruise/bleed easily.     PHYSICAL EXAM:  ECOG PERFORMANCE STATUS: 0 - Asymptomatic  Vitals:   01/31/24 0943 01/31/24 0947  BP: (!) 202/93 (!) 188/78  Pulse: 66   Resp: 17   Temp: 97.7 F (36.5 C)   SpO2: 95%      Filed Weights   01/31/24 0943  Weight: 172 lb (78 kg)     Physical Exam Constitutional:      Appearance: Normal appearance. He is normal weight.  Cardiovascular:     Heart sounds: Normal heart sounds.  Pulmonary:     Breath sounds: Normal breath sounds.  Neurological:     General: No focal deficit present.     Mental Status: Mental status is at baseline.  Psychiatric:        Behavior: Behavior normal. Behavior is cooperative.    PAST MEDICAL/SURGICAL HISTORY:  Past Medical History:  Diagnosis Date   Amputation of index finger    Chronic kidney disease, stage 2 (mild)    Diabetes (HCC)    Diabetes mellitus (HCC)    Disorder of the skin and subcutaneous tissue, unspecified    Glaucoma    High cholesterol    Hypercholesteremia    Hypertension    Malignant neoplasm of external lower lip    Melanoma (HCC)    lower back   Monoclonal gammopathy    Multiple myeloma not having achieved remission (HCC)    Other pancytopenia (HCC) 02/25/2014   Personal history of other malignant neoplasm of skin    Smoldering multiple myeloma (SMM) 02/25/2014   Unspecified glaucoma    Past Surgical History:  Procedure Laterality Date   AMPUTATION FINGER / THUMB Left    forefinger   BONE MARROW ASPIRATION Left 07/19/14   BONE MARROW BIOPSY Left 07/19/14   CATARACT EXTRACTION Bilateral    CORNEAL TRANSPLANT Right    MELANOMA EXCISION Left    left flank area 1980s   TONSILLECTOMY      SOCIAL HISTORY:  Social History   Socioeconomic History   Marital status: Widowed    Spouse name: Not on file   Number of children: Not on file   Years of education: Not on file   Highest education level: Not on file  Occupational  History   Not on file  Tobacco Use   Smoking status: Former    Types: Cigarettes   Smokeless tobacco: Former    Types: Designer, Multimedia Use   Vaping status: Never Used  Substance and Sexual Activity   Alcohol use: No   Drug use: No   Sexual activity: Not on file  Other Topics Concern   Not on file  Social History Narrative   Not on file   Social Drivers of Health   Tobacco Use: Medium Risk (05/03/2023)   Received from [facility]   Patient History    Smoking Tobacco Use: Former    Smokeless Tobacco Use: Former    Passive Exposure: Not on Actuary Strain: Not on file  Food Insecurity: Not on file  Transportation Needs: Not on file  Physical Activity: Not on file  Stress: Not on  file  Social Connections: Not on file  Intimate Partner Violence: Not on file  Depression [phone]): Low Risk (01/31/2024)   Depression (PHQ2-9)    PHQ-2 Score: 0  Alcohol Screen: Not on file  Housing: Not on file  Utilities: Not on file  Health Literacy: Not on file    FAMILY HISTORY:  Family History  Problem Relation Age of Onset   Cancer Mother    Stroke Father     CURRENT MEDICATIONS:  Outpatient Encounter Medications as of 01/31/2024  Medication Sig   amLODipine (NORVASC) 10 MG tablet Take 10 mg by mouth daily.    aspirin EC 325 MG tablet Take 325 mg by mouth daily.   doxazosin (CARDURA) 4 MG tablet Take 4 mg by mouth daily.    finasteride (PROSCAR) 5 MG tablet Take 5 mg by mouth daily.   glipiZIDE (GLUCOTROL) 10 MG tablet Take 1 tablet twice a day by oral route for 90 days.   irbesartan (AVAPRO) 300 MG tablet 1 TABLET ONCE A DAY ORALLY 90 DAYS   JANUVIA 100 MG tablet Take 100 mg by mouth daily.   JARDIANCE 25 MG TABS tablet TAKE 1 TABLET BY MOUTH EVERY MORNING FOR DIABETES   levothyroxine (SYNTHROID) 25 MCG tablet Take 25 mcg by mouth daily.   metoprolol (LOPRESSOR) 100 MG tablet Take 100 mg by mouth 2 (two) times daily.   Multiple Vitamins-Minerals (CENTRUM SILVER  PO) Take 1 capsule by mouth daily.   OneTouch Delica Lancets 33G MISC Apply topically daily.   ONETOUCH ULTRA test strip daily. for testing   potassium chloride  (KLOR-CON ) 10 MEQ tablet Take 1 tablet by mouth daily.   potassium chloride  SA (K-DUR,KLOR-CON ) 20 MEQ tablet Take 20 mEq by mouth daily.   pravastatin (PRAVACHOL) 40 MG tablet Take 40 mg by mouth daily.   timolol (TIMOPTIC) 0.5 % ophthalmic solution Place 1 drop into the right eye daily.    triamcinolone cream (KENALOG) 0.1 % SMARTSIG:1 Application Topical 2-3 Times Daily   valsartan-hydrochlorothiazide (DIOVAN-HCT) 160-12.5 MG per tablet Take 1 tablet by mouth daily.   No facility-administered encounter medications on file as of 01/31/2024.    ALLERGIES:  No Known Allergies  LABORATORY DATA:  I have reviewed the labs as listed.  CBC    Component Value Date/Time   WBC 3.9 (L) 01/24/2024 1031   RBC 4.46 01/24/2024 1031   HGB 14.1 01/24/2024 1031   HCT 40.8 01/24/2024 1031   PLT 163 01/24/2024 1031   MCV 91.5 01/24/2024 1031   MCH 31.6 01/24/2024 1031   MCHC 34.6 01/24/2024 1031   RDW 13.2 01/24/2024 1031   LYMPHSABS 1.3 01/24/2024 1031   MONOABS 0.4 01/24/2024 1031   EOSABS 0.2 01/24/2024 1031   BASOSABS 0.0 01/24/2024 1031      Latest Ref Rng & Units 01/24/2024   10:31 AM 07/13/2023   11:02 AM 01/05/2023   11:32 AM  CMP  Glucose 70 - 99 mg/dL 855  808  834   BUN 8 - 23 mg/dL 23  25  23    Creatinine 0.61 - 1.24 mg/dL 8.71  8.79  8.66   Sodium 135 - 145 mmol/L 141  136  141   Potassium 3.5 - 5.1 mmol/L 3.5  3.5  3.9   Chloride 98 - 111 mmol/L 101  100  100   CO2 22 - 32 mmol/L 27  26  31    Calcium 8.9 - 10.3 mg/dL 9.5  9.1  9.5   Total Protein 6.5 -  8.1 g/dL 8.5  8.3  8.6   Total Bilirubin 0.0 - 1.2 mg/dL 0.5  0.9  0.6   Alkaline Phos 38 - 126 U/L 87  73  69   AST 15 - 41 U/L 28  23  26    ALT 0 - 44 U/L 31  29  32     DIAGNOSTIC IMAGING:  I have independently reviewed the relevant imaging and discussed  with the patient.   WRAP UP:  All questions were answered. The patient knows to call the clinic with any problems, questions or concerns.  Medical decision making: Moderate  Time spent on visit: I spent 20 minutes counseling the patient face to face. The total time spent in the appointment was 30 minutes and more than 50% was on counseling.  Pleasant CHRISTELLA Barefoot, PA-C  01/31/2024 10:09 AM  "

## 2024-01-31 ENCOUNTER — Inpatient Hospital Stay: Admitting: Physician Assistant

## 2024-01-31 VITALS — BP 188/78 | HR 66 | Temp 97.7°F | Resp 17 | Ht 69.0 in | Wt 172.0 lb

## 2024-01-31 DIAGNOSIS — D472 Monoclonal gammopathy: Secondary | ICD-10-CM | POA: Diagnosis not present

## 2024-01-31 NOTE — Patient Instructions (Signed)
 [facility] at [facility] Discharge Instructions  PLAN SUMMARY:  >> Labs in 6 months >> OFFICE visit in 6 months (1 week after labs)    You were seen today by [name] PA-C for your smoldering myeloma.  Your labs are stable, which show that you are still in a pre-cancerous state, and that you do not have any signs of progressing to cancer (multiple myeloma) at this time.   We will repeat labs and see you for follow-up in  6 months.  ELEVATED BLOOD PRESSURE: Please recheck your blood pressure at home and contact your primary care provider if your blood pressure remains >160/90.  If you notice any red flag symptoms (abnormal headache, dizziness, blurry vision, strokelike symptoms, chest pain, or difficulty breathing), call 911 and seek immediate medical attention.    ** Thank you for trusting me with your healthcare!  I strive to provide all of my patients with quality care at each visit.  If you receive a survey for this visit, I would be so grateful to you for taking the time to provide feedback.  Thank you in advance!  ~ Mckynna Vanloan                                        Dr. Mickiel Davonna [name], PA-C          Delon Hope, NP   - - - - - - - - - - - - - - - - - -     Thank you for choosing [facility] at [facility] to provide your oncology and hematology care.  To afford each patient quality time with our provider, please arrive at least 15 minutes before your scheduled appointment time.   If you have a lab appointment with the Cancer Center please come in thru the Main Entrance and check in at the main information desk.  You need to re-schedule your appointment should you arrive 10 or more minutes late.  We strive to give you quality time with our providers, and arriving late affects you and other patients whose appointments are after yours.  Also, if you no show three or more times for appointments you may be  dismissed from the clinic at the providers discretion.     Again, thank you for choosing [facility].  Our hope is that these requests will decrease the amount of time that you wait before being seen by our physicians.       _____________________________________________________________  Should you have questions after your visit to [facility], please contact our office at [phone] and follow the prompts.  Our office hours are 8:00 a.m. and 4:30 p.m. Monday - Friday.  Please note that voicemails left after 4:00 p.m. may not be returned until the following business day.  We are closed weekends and major holidays.  You do have access to a nurse 24-7, just call the main number to the clinic [phone] and do not press any options, hold on the line and a nurse will answer the phone.    For prescription refill requests, have your pharmacy contact our office and allow 72 hours.    Due to Covid, you will need to wear a mask upon entering the hospital. If you do not have a mask, a mask  will be given to you at the Main Entrance upon arrival. For doctor visits, patients may have 1 support person age 22 or older with them. For treatment visits, patients can not have anyone with them due to social distancing guidelines and our immunocompromised population.

## 2024-02-13 ENCOUNTER — Ambulatory Visit: Payer: Self-pay | Admitting: Physician Assistant

## 2024-02-13 NOTE — Progress Notes (Signed)
 NURSES: Please call patient to let him know that his skeletal survey was overall normal.  He did not have any evidence of spots on his bones that would be concerning for progression to multiple myeloma cancer.  He will follow-up as scheduled in July 2026.  Pleasant CHRISTELLA Barefoot, PA-C 02/13/2024 3:40 PM

## 2024-02-14 NOTE — Progress Notes (Signed)
 Patient made aware and verbalized understanding.

## 2024-07-31 ENCOUNTER — Inpatient Hospital Stay

## 2024-08-07 ENCOUNTER — Inpatient Hospital Stay: Admitting: Physician Assistant
